# Patient Record
Sex: Female | Born: 1940 | Race: White | Hispanic: No | Smoking: Former smoker
Health system: Southern US, Community
[De-identification: ages and names within clinical notes are randomized; demographics above are authoritative.]

## PROBLEM LIST (undated history)

## (undated) DIAGNOSIS — Z9289 Personal history of other medical treatment: Secondary | ICD-10-CM

## (undated) DIAGNOSIS — J449 Chronic obstructive pulmonary disease, unspecified: Secondary | ICD-10-CM

## (undated) DIAGNOSIS — R51 Headache: Secondary | ICD-10-CM

## (undated) DIAGNOSIS — E785 Hyperlipidemia, unspecified: Secondary | ICD-10-CM

## (undated) DIAGNOSIS — F419 Anxiety disorder, unspecified: Secondary | ICD-10-CM

## (undated) DIAGNOSIS — J189 Pneumonia, unspecified organism: Secondary | ICD-10-CM

## (undated) DIAGNOSIS — J45909 Unspecified asthma, uncomplicated: Secondary | ICD-10-CM

## (undated) DIAGNOSIS — K219 Gastro-esophageal reflux disease without esophagitis: Secondary | ICD-10-CM

## (undated) DIAGNOSIS — M1990 Unspecified osteoarthritis, unspecified site: Secondary | ICD-10-CM

## (undated) DIAGNOSIS — G43909 Migraine, unspecified, not intractable, without status migrainosus: Secondary | ICD-10-CM

## (undated) DIAGNOSIS — I350 Nonrheumatic aortic (valve) stenosis: Secondary | ICD-10-CM

## (undated) DIAGNOSIS — IMO0001 Reserved for inherently not codable concepts without codable children: Secondary | ICD-10-CM

## (undated) DIAGNOSIS — I2510 Atherosclerotic heart disease of native coronary artery without angina pectoris: Secondary | ICD-10-CM

## (undated) DIAGNOSIS — R519 Headache, unspecified: Secondary | ICD-10-CM

## (undated) DIAGNOSIS — Z720 Tobacco use: Secondary | ICD-10-CM

## (undated) DIAGNOSIS — I2699 Other pulmonary embolism without acute cor pulmonale: Secondary | ICD-10-CM

## (undated) DIAGNOSIS — I10 Essential (primary) hypertension: Secondary | ICD-10-CM

## (undated) HISTORY — DX: Personal history of other medical treatment: Z92.89

## (undated) HISTORY — PX: FOOT SURGERY: SHX648

## (undated) HISTORY — DX: Essential (primary) hypertension: I10

## (undated) HISTORY — DX: Atherosclerotic heart disease of native coronary artery without angina pectoris: I25.10

## (undated) HISTORY — DX: Tobacco use: Z72.0

## (undated) HISTORY — DX: Hyperlipidemia, unspecified: E78.5

## (undated) HISTORY — DX: Other pulmonary embolism without acute cor pulmonale: I26.99

## (undated) HISTORY — PX: DILATION AND CURETTAGE OF UTERUS: SHX78

## (undated) HISTORY — PX: TONSILLECTOMY: SUR1361

## (undated) HISTORY — PX: ADENOIDECTOMY: SUR15

---

## 2007-01-17 HISTORY — PX: KNEE SURGERY: SHX244

## 2007-10-13 HISTORY — PX: CARDIAC CATHETERIZATION: SHX172

## 2009-03-18 ENCOUNTER — Encounter: Admission: RE | Admit: 2009-03-18 | Discharge: 2009-03-18 | Payer: Self-pay | Admitting: Family Medicine

## 2009-07-22 ENCOUNTER — Encounter: Admission: RE | Admit: 2009-07-22 | Discharge: 2009-07-22 | Payer: Self-pay | Admitting: Family Medicine

## 2009-09-05 ENCOUNTER — Encounter: Admission: RE | Admit: 2009-09-05 | Discharge: 2009-09-05 | Payer: Self-pay | Admitting: Family Medicine

## 2009-09-27 DIAGNOSIS — Z9289 Personal history of other medical treatment: Secondary | ICD-10-CM

## 2009-09-27 HISTORY — DX: Personal history of other medical treatment: Z92.89

## 2010-03-30 HISTORY — PX: BACK SURGERY: SHX140

## 2010-05-11 ENCOUNTER — Emergency Department (HOSPITAL_BASED_OUTPATIENT_CLINIC_OR_DEPARTMENT_OTHER)
Admission: EM | Admit: 2010-05-11 | Discharge: 2010-05-11 | Disposition: A | Discharge status: Home / Self Care | Payer: Medicare Other | Attending: Emergency Medicine | Admitting: Emergency Medicine

## 2010-05-11 DIAGNOSIS — J45909 Unspecified asthma, uncomplicated: Secondary | ICD-10-CM | POA: Insufficient documentation

## 2010-05-11 DIAGNOSIS — J329 Chronic sinusitis, unspecified: Secondary | ICD-10-CM | POA: Insufficient documentation

## 2010-05-11 DIAGNOSIS — R1110 Vomiting, unspecified: Secondary | ICD-10-CM | POA: Insufficient documentation

## 2010-05-11 DIAGNOSIS — R197 Diarrhea, unspecified: Secondary | ICD-10-CM | POA: Insufficient documentation

## 2010-05-11 DIAGNOSIS — F17200 Nicotine dependence, unspecified, uncomplicated: Secondary | ICD-10-CM | POA: Insufficient documentation

## 2010-05-11 DIAGNOSIS — K5289 Other specified noninfective gastroenteritis and colitis: Secondary | ICD-10-CM | POA: Insufficient documentation

## 2010-05-11 DIAGNOSIS — F341 Dysthymic disorder: Secondary | ICD-10-CM | POA: Insufficient documentation

## 2010-05-11 DIAGNOSIS — I10 Essential (primary) hypertension: Secondary | ICD-10-CM | POA: Insufficient documentation

## 2010-05-11 DIAGNOSIS — E7800 Pure hypercholesterolemia, unspecified: Secondary | ICD-10-CM | POA: Insufficient documentation

## 2010-05-11 LAB — URINALYSIS, ROUTINE W REFLEX MICROSCOPIC
Ketones, ur: 40 mg/dL — AB
Urine Glucose, Fasting: NEGATIVE mg/dL
Urobilinogen, UA: 0.2 mg/dL (ref 0.0–1.0)
pH: 7 (ref 5.0–8.0)

## 2010-05-11 LAB — DIFFERENTIAL
Basophils Relative: 0 % (ref 0–1)
Eosinophils Absolute: 0 10*3/uL (ref 0.0–0.7)
Eosinophils Relative: 0 % (ref 0–5)
Lymphocytes Relative: 4 % — ABNORMAL LOW (ref 12–46)
Monocytes Absolute: 0.3 10*3/uL (ref 0.1–1.0)

## 2010-05-11 LAB — URINE MICROSCOPIC-ADD ON

## 2010-05-11 LAB — COMPREHENSIVE METABOLIC PANEL
AST: 23 U/L (ref 0–37)
Alkaline Phosphatase: 89 U/L (ref 39–117)
BUN: 20 mg/dL (ref 6–23)
CO2: 26 mEq/L (ref 19–32)
Creatinine, Ser: 0.4 mg/dL (ref 0.4–1.2)
GFR calc non Af Amer: >60 mL/min (ref >60)
Total Bilirubin: 0.6 mg/dL (ref 0.3–1.2)
Total Protein: 6.5 g/dL (ref 6.0–8.3)

## 2010-05-11 LAB — CBC
HCT: 36.5 % (ref 36.0–46.0)
Hemoglobin: 12.7 g/dL (ref 12.0–15.0)
MCV: 89.9 fL (ref 78.0–100.0)
RBC: 4.06 MIL/uL (ref 3.87–5.11)
WBC: 8.9 10*3/uL (ref 4.0–10.5)

## 2010-05-13 LAB — URINE CULTURE
Colony Count: NO GROWTH
Culture  Setup Time: 201202122012
Culture: NO GROWTH

## 2010-07-29 DIAGNOSIS — I2699 Other pulmonary embolism without acute cor pulmonale: Secondary | ICD-10-CM

## 2010-07-29 HISTORY — DX: Other pulmonary embolism without acute cor pulmonale: I26.99

## 2010-08-01 ENCOUNTER — Ambulatory Visit (HOSPITAL_COMMUNITY)
Admission: RE | Admit: 2010-08-01 | Discharge: 2010-08-01 | Disposition: A | Discharge status: Home / Self Care | Payer: Medicare Other | Source: Ambulatory Visit | Attending: Anesthesiology | Admitting: Anesthesiology

## 2010-08-01 ENCOUNTER — Encounter (HOSPITAL_COMMUNITY)
Admission: RE | Admit: 2010-08-01 | Discharge: 2010-08-01 | Disposition: A | Discharge status: Home / Self Care | Payer: Medicare Other | Source: Ambulatory Visit | Attending: Orthopedic Surgery | Admitting: Orthopedic Surgery

## 2010-08-01 ENCOUNTER — Other Ambulatory Visit (HOSPITAL_COMMUNITY): Payer: Self-pay | Admitting: Orthopedic Surgery

## 2010-08-01 ENCOUNTER — Other Ambulatory Visit (HOSPITAL_COMMUNITY): Payer: Self-pay | Admitting: Anesthesiology

## 2010-08-01 ENCOUNTER — Ambulatory Visit (HOSPITAL_COMMUNITY)
Admission: RE | Admit: 2010-08-01 | Discharge: 2010-08-01 | Disposition: A | Discharge status: Home / Self Care | Payer: Medicare Other | Source: Ambulatory Visit | Attending: Orthopedic Surgery | Admitting: Orthopedic Surgery

## 2010-08-01 DIAGNOSIS — Z01811 Encounter for preprocedural respiratory examination: Secondary | ICD-10-CM

## 2010-08-01 DIAGNOSIS — Z01810 Encounter for preprocedural cardiovascular examination: Secondary | ICD-10-CM | POA: Insufficient documentation

## 2010-08-01 DIAGNOSIS — R0789 Other chest pain: Secondary | ICD-10-CM | POA: Insufficient documentation

## 2010-08-01 DIAGNOSIS — Z01818 Encounter for other preprocedural examination: Secondary | ICD-10-CM | POA: Insufficient documentation

## 2010-08-01 DIAGNOSIS — I2699 Other pulmonary embolism without acute cor pulmonale: Secondary | ICD-10-CM

## 2010-08-01 DIAGNOSIS — Z01812 Encounter for preprocedural laboratory examination: Secondary | ICD-10-CM | POA: Insufficient documentation

## 2010-08-01 LAB — COMPREHENSIVE METABOLIC PANEL
Alkaline Phosphatase: 79 U/L (ref 39–117)
BUN: 9 mg/dL (ref 6–23)
Creatinine, Ser: <0.47 mg/dL (ref 0.4–1.2)
Potassium: 4.3 mEq/L (ref 3.5–5.1)
Total Bilirubin: 0.2 mg/dL — ABNORMAL LOW (ref 0.3–1.2)
Total Protein: 6.4 g/dL (ref 6.0–8.3)

## 2010-08-01 LAB — CBC
HCT: 37.7 % (ref 36.0–46.0)
MCH: 30.7 pg (ref 26.0–34.0)
MCHC: 33.4 g/dL (ref 30.0–36.0)
Platelets: 255 10*3/uL (ref 150–400)

## 2010-08-01 LAB — DIFFERENTIAL
Basophils Absolute: 0 10*3/uL (ref 0.0–0.1)
Eosinophils Relative: 0 % (ref 0–5)
Lymphs Abs: 1.8 10*3/uL (ref 0.7–4.0)
Monocytes Absolute: 1.2 10*3/uL — ABNORMAL HIGH (ref 0.1–1.0)

## 2010-08-01 LAB — URINE MICROSCOPIC-ADD ON

## 2010-08-01 LAB — APTT: aPTT: 30 seconds (ref 24–37)

## 2010-08-01 LAB — URINALYSIS, ROUTINE W REFLEX MICROSCOPIC
Glucose, UA: NEGATIVE mg/dL
Nitrite: NEGATIVE
Specific Gravity, Urine: 1.019 (ref 1.005–1.030)
Urobilinogen, UA: 1 mg/dL (ref 0.0–1.0)
pH: 6 (ref 5.0–8.0)

## 2010-08-02 ENCOUNTER — Ambulatory Visit (HOSPITAL_COMMUNITY)
Admission: RE | Admit: 2010-08-02 | Discharge: 2010-08-02 | Disposition: A | Discharge status: Home / Self Care | Payer: Medicare Other | Source: Ambulatory Visit | Attending: Orthopedic Surgery | Admitting: Orthopedic Surgery

## 2010-08-02 ENCOUNTER — Other Ambulatory Visit: Payer: Self-pay | Admitting: Orthopedic Surgery

## 2010-08-02 ENCOUNTER — Inpatient Hospital Stay (HOSPITAL_COMMUNITY)
Admission: AD | Admit: 2010-08-02 | Discharge: 2010-08-10 | DRG: 167 | Disposition: A | Discharge status: Home Health | Payer: Medicare Other | Source: Ambulatory Visit | Attending: Internal Medicine | Admitting: Internal Medicine

## 2010-08-02 DIAGNOSIS — Z01812 Encounter for preprocedural laboratory examination: Secondary | ICD-10-CM

## 2010-08-02 DIAGNOSIS — R05 Cough: Secondary | ICD-10-CM

## 2010-08-02 DIAGNOSIS — R059 Cough, unspecified: Secondary | ICD-10-CM

## 2010-08-02 DIAGNOSIS — K219 Gastro-esophageal reflux disease without esophagitis: Secondary | ICD-10-CM | POA: Diagnosis present

## 2010-08-02 DIAGNOSIS — N390 Urinary tract infection, site not specified: Secondary | ICD-10-CM | POA: Diagnosis present

## 2010-08-02 DIAGNOSIS — Z01810 Encounter for preprocedural cardiovascular examination: Secondary | ICD-10-CM

## 2010-08-02 DIAGNOSIS — I2699 Other pulmonary embolism without acute cor pulmonale: Principal | ICD-10-CM | POA: Diagnosis present

## 2010-08-02 DIAGNOSIS — J45909 Unspecified asthma, uncomplicated: Secondary | ICD-10-CM | POA: Diagnosis present

## 2010-08-02 DIAGNOSIS — I2510 Atherosclerotic heart disease of native coronary artery without angina pectoris: Secondary | ICD-10-CM | POA: Diagnosis present

## 2010-08-02 DIAGNOSIS — Z882 Allergy status to sulfonamides status: Secondary | ICD-10-CM

## 2010-08-02 DIAGNOSIS — I10 Essential (primary) hypertension: Secondary | ICD-10-CM | POA: Diagnosis present

## 2010-08-02 DIAGNOSIS — M5380 Other specified dorsopathies, site unspecified: Secondary | ICD-10-CM | POA: Diagnosis present

## 2010-08-02 DIAGNOSIS — Z79899 Other long term (current) drug therapy: Secondary | ICD-10-CM

## 2010-08-02 DIAGNOSIS — Z880 Allergy status to penicillin: Secondary | ICD-10-CM

## 2010-08-02 DIAGNOSIS — F411 Generalized anxiety disorder: Secondary | ICD-10-CM | POA: Diagnosis present

## 2010-08-02 DIAGNOSIS — G4730 Sleep apnea, unspecified: Secondary | ICD-10-CM | POA: Diagnosis present

## 2010-08-02 DIAGNOSIS — Z881 Allergy status to other antibiotic agents status: Secondary | ICD-10-CM

## 2010-08-02 DIAGNOSIS — Z01818 Encounter for other preprocedural examination: Secondary | ICD-10-CM

## 2010-08-02 DIAGNOSIS — F17200 Nicotine dependence, unspecified, uncomplicated: Secondary | ICD-10-CM | POA: Diagnosis present

## 2010-08-02 DIAGNOSIS — Z888 Allergy status to other drugs, medicaments and biological substances status: Secondary | ICD-10-CM

## 2010-08-02 DIAGNOSIS — M5137 Other intervertebral disc degeneration, lumbosacral region: Secondary | ICD-10-CM | POA: Diagnosis present

## 2010-08-02 DIAGNOSIS — M51379 Other intervertebral disc degeneration, lumbosacral region without mention of lumbar back pain or lower extremity pain: Secondary | ICD-10-CM | POA: Diagnosis present

## 2010-08-02 DIAGNOSIS — E785 Hyperlipidemia, unspecified: Secondary | ICD-10-CM | POA: Diagnosis present

## 2010-08-02 LAB — BASIC METABOLIC PANEL
BUN: 10 mg/dL (ref 6–23)
Calcium: 9.6 mg/dL (ref 8.4–10.5)
Creatinine, Ser: <0.47 mg/dL (ref 0.4–1.2)
Glucose, Bld: 174 mg/dL — ABNORMAL HIGH (ref 70–99)

## 2010-08-02 LAB — PROTIME-INR
INR: 0.9 (ref 0.00–1.49)
Prothrombin Time: 12.4 seconds (ref 11.6–15.2)

## 2010-08-02 LAB — CBC
MCH: 30.7 pg (ref 26.0–34.0)
MCHC: 33.9 g/dL (ref 30.0–36.0)
MCV: 90.5 fL (ref 78.0–100.0)
Platelets: 257 10*3/uL (ref 150–400)
RDW: 13 % (ref 11.5–15.5)
WBC: 7 10*3/uL (ref 4.0–10.5)

## 2010-08-02 MED ORDER — IOHEXOL 300 MG/ML  SOLN: 100.0000 mL | Freq: Once | INTRAMUSCULAR | Status: DC | PRN

## 2010-08-03 DIAGNOSIS — I2699 Other pulmonary embolism without acute cor pulmonale: Secondary | ICD-10-CM

## 2010-08-03 LAB — URINALYSIS, MICROSCOPIC ONLY
Glucose, UA: NEGATIVE mg/dL
Nitrite: NEGATIVE
pH: 6.5 (ref 5.0–8.0)

## 2010-08-04 LAB — URINE CULTURE

## 2010-08-05 ENCOUNTER — Inpatient Hospital Stay (HOSPITAL_COMMUNITY): Payer: Medicare Other

## 2010-08-05 DIAGNOSIS — I2699 Other pulmonary embolism without acute cor pulmonale: Secondary | ICD-10-CM

## 2010-08-05 LAB — CBC
HCT: 35.7 % — ABNORMAL LOW (ref 36.0–46.0)
Hemoglobin: 11.8 g/dL — ABNORMAL LOW (ref 12.0–15.0)
MCV: 91.1 fL (ref 78.0–100.0)
RDW: 13 % (ref 11.5–15.5)
WBC: 7 10*3/uL (ref 4.0–10.5)

## 2010-08-05 LAB — BASIC METABOLIC PANEL
BUN: 11 mg/dL (ref 6–23)
Chloride: 101 mEq/L (ref 96–112)
Glucose, Bld: 99 mg/dL (ref 70–99)
Potassium: 4.4 mEq/L (ref 3.5–5.1)
Sodium: 139 mEq/L (ref 135–145)

## 2010-08-05 MED ORDER — IOHEXOL 300 MG/ML  SOLN: 95.0000 mL | Freq: Once | INTRAMUSCULAR | Status: AC | PRN

## 2010-08-06 ENCOUNTER — Inpatient Hospital Stay (HOSPITAL_COMMUNITY): Payer: Medicare Other

## 2010-08-06 ENCOUNTER — Inpatient Hospital Stay (HOSPITAL_COMMUNITY): Admission: RE | Admit: 2010-08-06 | Payer: Medicare Other | Source: Ambulatory Visit | Admitting: Orthopedic Surgery

## 2010-08-07 LAB — BASIC METABOLIC PANEL
CO2: 29 mEq/L (ref 19–32)
Calcium: 8.2 mg/dL — ABNORMAL LOW (ref 8.4–10.5)
Chloride: 103 mEq/L (ref 96–112)
Glucose, Bld: 139 mg/dL — ABNORMAL HIGH (ref 70–99)
Potassium: 3.8 mEq/L (ref 3.5–5.1)
Sodium: 135 mEq/L (ref 135–145)

## 2010-08-07 LAB — POCT I-STAT 7, (LYTES, BLD GAS, ICA,H+H)
Acid-Base Excess: 7 mmol/L — ABNORMAL HIGH (ref 0.0–2.0)
Calcium, Ion: 1.1 mmol/L — ABNORMAL LOW (ref 1.12–1.32)
O2 Saturation: 100 %
Patient temperature: 37
TCO2: 34 mmol/L (ref 0–100)
pCO2 arterial: 50.7 mmHg — ABNORMAL HIGH (ref 35.0–45.0)
pO2, Arterial: 478 mmHg — ABNORMAL HIGH (ref 80.0–100.0)

## 2010-08-07 LAB — CBC
HCT: 29.7 % — ABNORMAL LOW (ref 36.0–46.0)
Hemoglobin: 9.9 g/dL — ABNORMAL LOW (ref 12.0–15.0)
RBC: 3.19 MIL/uL — ABNORMAL LOW (ref 3.87–5.11)
WBC: 14.4 10*3/uL — ABNORMAL HIGH (ref 4.0–10.5)

## 2010-08-07 NOTE — Consult Note (Signed)
NAME:  Judith Stevens, Judith Stevens NO.:  [id number]  MEDICAL RECORD NO.:  [id number]           PATIENT TYPE:  I  LOCATION:  3707                         FACILITY:  [facility]  PHYSICIAN:  Coralyn Helling, MD        DATE OF BIRTH:  [birth year]  DATE OF CONSULTATION: DATE OF DISCHARGE:                                CONSULTATION   REFERRING PHYSICIAN:  Calvert Cantor, MD  REASON FOR CONSULTATION:  Pulmonary emboli.  HISTORY OF PRESENT ILLNESS:  This 70 year old white female who was admitted on Aug 02, 2010, with chief complaint of pleuritic-type chest discomfort.  This was first noted on July 28, 2010.  It progressed significantly to the level of severe pleuritic type left-sided chest wall pain with some mild associated dyspnea on hospital admit day. Ultimately, she underwent a thoroughly diagnostic evaluation, this included a CT of chest which did indeed demonstrate left-sided pulmonary emboli with associated left-sided pulmonary infarct.  She was admitted for systemic anticoagulation and further evaluation.  Pulmonary has been asked to evaluate Ms. Maves as she also has a pending lumbar decompression surgery by [facility], and we have been asked to evaluate and give recommendations in regards to her anticoagulation regimen.  PAST MEDICAL HISTORY: 1. Coronary artery disease. 2. Asthma. 3. Gastroesophageal reflux disease. 4. 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 5. Tonsillectomy. 6. Left knee arthroscopy. 7. Bunionectomy.  ALLERGIES:  Many and include PENICILLIN, SULFA, TETRACYCLINE, PERCOCET, AVELOX and NIACIN.  FAMILY HISTORY:  Father had lung cancer.  This was resected surgically without sequelae.  Both parents had heart disease, mother had diabetes.  SOCIAL HISTORY:  She is a current smoker smoked for over 40 years but has decreased down to 3 cigarettes a day.  Drinks occasional alcohol. Lives alone.  CURRENT MEDICATIONS:  Xanax, Lipitor,  belladonna/pentobarbital, diltiazem, therapeutic strength Lovenox, Lexapro, Zetia, Flonase, Advair, Claritin, multivitamin, nicotine patch, p.r.n. Ventolin, Dulcolax, p.r.n. Fioricet, p.r.n. Norco.  REVIEW OF SYSTEMS:  GENERAL:  Denies weight increase or decrease. Denies appetite change.  Denies fever or chills.  HEENT:  No headache, visual changes, tinnitus, sinusitis, change in smell, no postnasal drip, no dysphagia, no voice hoarseness, no neck pain or stiffness. PULMONARY:  Denies wheeze, shortness of breath, cough, however, does have left-sided pleuritic-type chest pain with deep inspiration.  This has improved since treatment.  CARDIAC:  Denies chest pain, palpitations, orthopnea.  GI:  No nausea, vomiting or stool changes. GU:  Denies dysuria, frequency or hesitancy.  Also denies change in quantity or quality of urine.  NEUROLOGICALLY:  Within normal. ENDOCRINE:  Within baseline.  PHYSICAL EXAMINATION:  VITAL SIGNS:  Temperature 97.5, heart rate 63- 70s, blood pressure 139/70, respirations 16-20, saturation is 95-97% on room air. GENERAL:  This is a 70 year old white female currently in no acute distress. HEENT:  She is normocephalic without jugular venous distention. PULMONARY:  Clear to auscultation.  She still splints the left chest wall with deep inspiration. CARDIAC:  Regular rate and rhythm. ABDOMEN:  Soft, nontender. GU:  Clear yellow. NEUROLOGICALLY:  Intact. EXTREMITIES:  Without edema or discoloration.  She has 2+ pulses.  CURRENT LABORATORY DATA:  CT chest demonstrates  left lingular area pulmonary artery emboli, with associated distal infarct.  The lower extremity Doppler is negative for DVT.  Hemoglobin 11.8, hematocrit 35.7, white blood cell count is 7, platelet count 276.  Sodium 139, potassium 4.4, chloride 101, CO2 33, BUN 11, creatinine 0.47, glucose 99.  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   Recommendations would be ideally to delay surgery for 6 weeks with anticoagulation.  If the surgery cannot be delayed, then an IVC filter could be placed and resume anticoagulation as soon as feasible after surgery.  She would then need to be anticoagulated for at least 6 months to 1 year following this for adequate treatment of pulmonary emboli.  Please keep in mind placement of IVC filter would not protect her from potential progression of current clot burden.     Zenia Resides, NP   ______________________________ Coralyn Helling, MD    PB/MEDQ  D:  08/05/2010  T:  08/06/2010  Job:  [id number]  Electronically Signed by Zenia Resides NP on 08/06/2010 12:56:21 PM Electronically Signed by Coralyn Helling MD on 08/07/2010 09:44:22 AM

## 2010-08-08 ENCOUNTER — Inpatient Hospital Stay (HOSPITAL_COMMUNITY): Payer: Medicare Other

## 2010-08-08 LAB — URINALYSIS, MICROSCOPIC ONLY
Bilirubin Urine: NEGATIVE
Hgb urine dipstick: NEGATIVE
Specific Gravity, Urine: 1.011 (ref 1.005–1.030)
Urobilinogen, UA: 0.2 mg/dL (ref 0.0–1.0)
pH: 7 (ref 5.0–8.0)

## 2010-08-15 LAB — CULTURE, BLOOD (ROUTINE X 2): Culture: NO GROWTH

## 2010-08-17 NOTE — Discharge Summary (Signed)
  NAME:  Judith Stevens, Judith Stevens NO.:  [id number]  MEDICAL RECORD NO.:  [id number]           PATIENT TYPE:  LOCATION:                                [facility]  PHYSICIAN:  Estill Bamberg, MD      DATE OF BIRTH:  May 28, 1940  DATE OF ADMISSION:  08/06/2010 DATE OF DISCHARGE:  08/10/2010                              DISCHARGE SUMMARY   ADMISSION DIAGNOSIS:  L3-S1 spinal stenosis with severe right-sided L5- S1 foraminal stenosis.  DISCHARGE DIAGNOSIS:  L3-S1 spinal stenosis with severe right-sided L5- S1 foraminal stenosis.  PROCEDURE PERFORMED:  L3-S1 decompression with L5-S1 transforaminal lumbar interbody fusion on the right side performed on Aug 06, 2010.  ADMISSION HISTORY:  Briefly, [name] is a very pleasant 70 year old female, who presented to my office with severe debilitating pain in her right leg.  An MRI was consistent with stenosis involving three levels and severe neural foraminal stenosis on the right side at L5-S1 with rather prominent intervertebral collapse at that level.  The patient did fail conservative care.  We did have a discussion regarding going forward with three-level decompression and one-level instrumented fusion.  The patient was admitted on Aug 06, 2010 for the procedure noted above.  HOSPITAL COURSE:  On Aug 06, 2010, the patient was brought to Surgery and underwent the procedure noted above.  The patient tolerated the procedure well and was transferred to recovery in stable condition.  A PCA was given.  She was started immediately postoperatively and was discontinued on the morning of postop day #1.  The patient was mobilized progressively throughout her hospital stay and was neurovascularly intact throughout her hospital stay as well.  The patient was ultimately discharged on postop day #4.  Of particular note, the patient was found to have a small pulmonary infarct.  The patient was therefore admitted to the Medicine team prior  to the surgery noted above and was managed by the Medicine team leading up to her surgery.  An IVC filter was placed and it was felt safe to go forward with the procedure noted above with the IVC filter in place.  Again, the patient was uneventfully discharged on postop day #4.  DISCHARGE INSTRUCTIONS:  The patient will take Percocet for pain and Valium for spasms.  I will evaluate the patient in my office at approximately 2 weeks after her procedure.  The remainder of the patient's discharge instructions and medical care is as per the medicine team.  I have asked that the patient hold off on anticoagulation for a total of 7 days following her procedure, given the risk of an epidural hematoma with anticoagulation.     Estill Bamberg, MD     MD/MEDQ  D:  08/11/2010  T:  08/11/2010  Job:  [id number]  Electronically Signed by Estill Bamberg  on 08/17/2010 02:24:18 PM

## 2010-08-17 NOTE — Op Note (Signed)
Judith Stevens, SECREST NO.:  [id number]  MEDICAL RECORD NO.:  [id number]           PATIENT TYPE:  LOCATION:                                 FACILITY:  PHYSICIAN:  Estill Bamberg, MD      DATE OF BIRTH:  1940/11/22  DATE OF PROCEDURE:  08/06/2010 DATE OF DISCHARGE:                              OPERATIVE REPORT   PREOPERATIVE DIAGNOSES: 1. L3-4, L4-5, L5-S1 spinal stenosis with severe neuroforaminal     stenosis on the right side at L5-S1 resulting in severe right-sided     lumbar radiculopathy. 2. Severe profound L5-S1 degenerative disk disease with significant     height loss resulting in neuroforaminal stenosis as noted above.  POSTOPERATIVE DIAGNOSES: 1. L3-4, L4-5, L5-S1 spinal stenosis with severe neuroforaminal     stenosis on the right side at L5-S1 resulting in severe right-sided     lumbar radiculopathy. 2. Severe profound L5-S1 degenerative disk disease with significant     height loss resulting in neuroforaminal stenosis as noted above.  PROCEDURES: 1. L3-4, L4-5, L5-S1 laminectomy, partial facetectomy and     foraminotomy. 2. Posterolateral spinal fusion L5-S1. 3. Right-sided L5-S1 interbody fusion. 4. Placement of posterior instrumentation L5-S1. 5. Insertion of intervertebral spacer, 27-mm (Concorde bullet cage, 9-     mm height, 8-mm width, 27-mm length). 6. Use of local autograft. 7. Intraoperative use of fluoroscopy.  SURGEON:  Estill Bamberg, MD  ASSISTANT:  None.  ANESTHESIA:  General endotracheal anesthesia.  ESTIMATED BLOOD LOSS:  600 mL with 200 mL of Cell Saver retransfused.  COMPLICATIONS:  None.  DISPOSITION:  Stable.  INDICATIONS FOR PROCEDURE:  Briefly, Judith Stevens is a very pleasant 70- year-old female who did initially presented to me on July 21, 2010, with severe and profound pain in her right leg involving the right groin and the lateral aspect of her thigh and leg.  Of note, the patient has had epidural  injections which she did state helped her significantly, but only on a temporary basis.  She also has failed other conservative care in the form of anti-inflammatories, physical therapy in addition to pain medications.  She did continue to complain of ongoing pain.  I did review an MRI dated March 29, 2010, which was consistent with multilevel degenerative disk disease with spinal stenosis associated with L3-4, L4-5 and L5-S1.  Of particular note, there was noted to be severe height loss at L5-S1 with profound neuroforaminal stenosis secondary to facet hypertrophy and height loss on the right side at L5- S1.  Given the patient's failure of conservative care, we did discuss going forward with a three-level decompression and a instrumented posterolateral fusion with interbody fixation.  The patient fully understood the risks and limitations of the procedure as outlined in my preoperative note.  Of note, upon the patient's workup, she was noted to have a small pulmonary embolus.  I did discuss this.  The patient was ultimately admitted to the medicine team as a result of this particular finding. It was noted that the patient was hemodynamically stable.  The medicine team did ultimately consult Pulmonology.  A IVC filter was placed and I did  have a discussion with the pulmonologist on the day prior to the patient's procedure.  The pulmonologist did discuss that the patient was at increased risk of pulmonary compromise if in additional DVT were to occur, particularly if they were to result in an additional PE.  This was the rationale behind placing a IVC filter for the patient being bridged with Lovenox leading up to her surgery.  Also of note, preoperative duplex ultrasound was obtained and it was negative for any lower extremity DVTs.  The patient did understand that she was at an increased risk of pulmonary compromise given her clinical situation noted above, but she did agree to go  forward.  Also of note, I did explain to the Medicine and the Pulmonary Team that I would ask for the anticoagulation to be held for a total of 7 days in order to ensure that there was no epidural hematoma.  OPERATIVE DETAILS:  On Aug 06, 2010, the patient was brought to surgery and general endotracheal anesthesia was administered.  The patient was placed prone on a well-padded Jackson spinal frame.  All bony prominences were meticulously padded.  Time-out procedure was performed. Vancomycin was given for antibiotic prophylaxis.  SCDs were placed.  The back was then prepped and draped in the usual sterile fashion.  I then made an incision from approximately the spinous process of L3 to approximately the spinous process of S1.  The lamina of L3, L4, L5 and S1 were subperiosteally exposed.  I did place a Kocher over the spinous process of L5 and I did obtain a lateral intraoperative radiograph to help confirm the appropriate operative levels.  I then went forward with exposing the posterolateral gutters first on the right and then on the left.  The transverse process of L5 and the sacral ala of the S1 was subperiosteally exposed and the posterolateral gutters were packed with Ray-Tec soaked with thrombin.  At this point, I went forward with the decompressive aspect of the procedure.  Using a 4-mm high-speed bur and a series of Kerrison punches, I did go forward with a central decompression across the L5-S1, then the L4-5, then the L3-4 interspaces.  I then went forward with a lateral recess decompression bilaterally working from superiorly to inferiorly.  I then performed a thorough and complete neuroforaminal decompression on the right side at L3-4, L4-5 and L5-S1.  Of note, there was rather profound lateral recess and foraminal narrowing at L3-4, to a lesser extent L4-5 and to a rather severe extended L5-S1.  Upon my decompression of L5-S1, I did remove the vast majority of the facet  joint in order to ensure that there was full and complete decompression of the exiting L5 nerve.  At the termination of the decompression, I was easily able pass a Woodson out the neuroforamen on the right side at every intervertebral level.  At this point, I went forward with cannulating the pedicles.  Using live AP and lateral fluoroscopy, I did use a 4-mm bur followed by a curved gearshift probe, followed by a ball-tip probe in order to cannulate the pedicles of L5 and of S1.  The holes were filled with bone wax.  I then went forward with the interbody fixation aspect of the procedure.  With an assistant holding medial retraction of the traversing S1 nerve, I did perform annulotomy using a 15 blade knife and I did use a 7-mm paddle scraper in order to help prepare the endplates above and below.  This was done  under live fluoroscopy in order to confirm the appropriate trajectory of the intervertebral space.  I then used a series of curettes in order to help prepare the intervertebral space adequately. I then went forward with placement of a series of trials and I felt that an 8-mm intervertebral spacer would be the most appropriate fit.  I then took the autograft that was obtained from the decompressive aspect of the procedure and this was packed into the intervertebral space.  The 8- mm bullet cage was also packed into the intervertebral space and was tamped into position under live fluoroscopy.  I was extremely pleased with the final press fit of the intervertebral graft.  The placement was excellent on the AP and lateral fluoroscopic views.  At this point, I placed L5 and S1 screws.  A 7 x 45 mm screws were placed at L5 and 7 x 40 mm screws were placed at S1.  I did use motor evoked EMG potentials and the L5 screw tested at 22, where the S1 screw tested at 42.  I then placed a 35-mm rod into position and caps were placed and a final locking procedure was performed.  I then turned my  attention towards the patient's left side.  I did use a 4-mm high-speed bur to thoroughly decorticate the posterior elements of L5 and S1 on the left in addition to the transverse process and the sacral ala on the left.  An abundant amount of autograft was packed into the posterior elements and the posterolateral gutter.  I then placed L5 and S1 screws as described previously.  The L5 screw tested at 26 mA and the S1 screw tested at 40 mA.  A 35-mm rod was placed and a final locking procedure was performed. I was very happy with the final construct and I did get a final lateral intraoperative radiograph in order to confirm appropriate placement of the hardware.  Again, I was very pleased with the final construct. Prior to placing the autograft and the left posterolateral gutter, I did copiously irrigate the wound with approximately 2 L of normal saline. Also of note, I did relax retractors approximately every 30 minutes in order to help optimize perfusion to the soft tissues.  At this point of the procedure, I did turn my attention towards epidural bleeding and I did spend approximately 30 minutes using FloSeal in addition to bipolar electrocautery to control all epidural bleeders I was able to encounter. I ultimately was able to adequately control epidural bleeding completely.  A #15 Blake drain was placed.  The fascia was then closed using #1 Vicryl.  The subcutaneous layer was closed using 2-0 Vicryl and the skin was closed using 3-0 Monocryl.  All instrument counts were correct at the termination of the procedure.  Of note, the patient had full neurovascular function upon awakening.     Estill Bamberg, MD     MD/MEDQ  D:  08/06/2010  T:  08/07/2010  Job:  [id number]  Electronically Signed by Estill Bamberg  on 08/17/2010 02:24:23 PM

## 2010-08-28 NOTE — H&P (Signed)
NAME:  Judith Stevens, Judith Stevens NO.:  [id number]  MEDICAL RECORD NO.:  [id number]           PATIENT TYPE:  LOCATION:                                 FACILITY:  PHYSICIAN:  Calvert Cantor, M.D.     DATE OF BIRTH:  [birth year]  DATE OF ADMISSION: DATE OF DISCHARGE:                             HISTORY & PHYSICAL   PRIMARY CARE PHYSICIAN:  Dr. Warrick Parisian at [facility] in [place].  PRESENTING COMPLAINT:  She is being admitted for pulmonary emboli.  HISTORY OF PRESENT ILLNESS:  This is a 70 year old female with a past medical history of hypertension, coronary artery disease unstentable, asthma, and bone spurs in the lumbar area causing nerve root compression.  The patient states that she has been having left lower-sided chest pain for the past few days now.  An x-ray of her chest was initially performed yesterday, which reveals a left lower lobe opacity, which was suspicious of infarct.  Therefore, a CT of her chest was ordered today, which revealed pulmonary emboli involving the lingular pulmonary artery branches along with a lingular infarct.  The patient is therefore being admitted for management.  Complicating her issues as the fact that the patient was scheduled for a lumbar decompression and fusion 3 days from today.  The patient has no symptoms of shortness of breath at rest or with exertion.  She states pain is better than it was one and initially started.  PAST MEDICAL HISTORY: 1. Coronary artery disease.  The patient states that the vessel was     not stentable.  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  She has been stable since then. 2. Asthma. 3. Gastroesophageal reflux disease. 4. Bone spurs in lumbar area.  PAST SURGICAL HISTORY: 1. Tonsillectomy. 2  Left knee arthroscopy. 1. Bunionectomy. 2. Cardiac cath.  ALLERGIES:  The patient has numerous allergies.  These are as follows 1.  PENICILLIN. 2. SULFA. 3. TETRACYCLINE. 4. PERCOCET, which causes shortness of breath. 5. AVELOX causes shortness of breath. 6. NAPROSYN causes shortness of breath. 7. CEFPROZIL also causes shortness of breath. 8. Food allergies include PEANUTS, RED DYE 40, and all DAIRY PRODUCTS.  MEDICATIONS:  Per med rec, she is on the following; 1. Multivitamin/iron OTC 1 tab daily. 2. Diltiazem CD 240 mg at bedtime. 3. Terbutaline 5 mg tablet as needed for shortness of breath. 4. Advair 150/21 two puffs twice a day. 5. Fluticasone Veramyst 27.5 mcg 1 spray each nostril daily. 6. Xyzal 5 mg twice a day. 7. Zetia 10 mg at bedtime. 8. Nitroglycerin 0.4 mg every 5 minutes as needed up to 3 doses. 9. Lipitor 1 tab daily at bedtime. 10.Belladonna alkaloids 1 tab q.i.d. 11.Protonix 40 mg b.i.d. with meals. 12.Alprazolam 0.25 mg every 8 hours as needed. 13.Lexapro 20 mg daily. 14.Fiorinal with Codeine 1 tab daily as needed. 15.Albuterol inhaler 2 puffs q.4 h. as needed.  FAMILY HISTORY:  Lung cancer in her father.  Both parents were had heart disease.  Mother had diabetes.  She has a sister with diabetes and carotid artery stenosis.  SOCIAL HISTORY:  Has  smoked for about 40 years, recently a pack was lasting her in about 3 days.  Drinks alcohol socially.  She lives alone.  REVIEW OF SYSTEMS:  Has lost about 6 pounds in the past few weeks, as she does not want it to cook with her leg pain.  HEENT:  Complains of sinus headaches.  No change in vision.  No blurred vision or double vision.  No sore throat or earaches.  She does complain of migraines. RESPIRATORY:  No cough.  No shortness of breath unless she is exposed to some of her allergies.  CARDIAC:  No chest pain, palpitations, or pedal edema.  GI:  No nausea, vomiting, diarrhea, or constipation.  GU:  No dysuria, hematuria, or incontinence.  Hematologically, bruises easily. SKIN:  No rash.  MUSCULOSKELETAL:  Has back pain, which radiates  down her right leg.  Neurologically, no history of stroke or seizure. Psychologically has generalized anxiety disorder, controlled with Lexapro.  PHYSICAL EXAMINATION:  VITAL SIGNS:  Elderly female sitting up in bed, in no acute distress. VITAL SIGNS:  Temperature 98.8, pulse 81, respiratory rate 19, blood pressure 140/65, and pulse ox 97% on room air. HEENT:  Pupils equal, round, and reactive to light.  Extraocular movements are intact.  Conjunctivae is pink.  No scleral icterus.  Oral mucosa moist. NECK:  Supple.  No thyromegaly, lymphadenopathy, or carotid bruits. HEART:  Regular rate and rhythm.  No murmurs, rubs, or gallops. LUNGS:  Clear bilaterally.  Good respiratory effort.  No use of accessory muscles. ABDOMEN:  Soft, nontender, and nondistended.  Bowel sounds positive.  No organomegaly. EXTREMITIES:  No cyanosis, clubbing, or edema.  Pedal pulses positive. NEUROLOGIC:  Cranial nerves II through XII intact.  Strength intact in all 4 extremities. PSYCHOLOGIC:  Awake, alert, and oriented x3.  Mood and affect normal. SKIN:  Warm and dry.  Few bruises.  No rash.  LABORATORY DATA:  Blood work, CBC is within normal limits except for hemoglobin of 11.9 and a hematocrit of 35.1.  Metabolic panel is also within normal limits except for glucose of 174.  PT is normal at 12.4 and INR 0.9.  Of note, UA yesterday revealed large leukocytes and 21-50 wbc's and few bacteria.  The patient does not complain of any dysuria.  IMAGING RESULTS:  CT angiogram of the chest performed today revealed pulmonary emboli involving the lingula and pulmonary artery branches. There is abnormal parenchymal lung tissue at the base of the lingula concerning for infarcted tissue.  There is a small amount of left pleural fluid.  This is not scattered small low density structures in the liver, which are nonspecific.  These most likely represents cysts, unless there is a history of malignancy.  Chest x-ray  2-view performed on Aug 01, 2010, reveals new left lower lobe opacity.  ASSESSMENT AND PLAN: 1. Pulmonary emboli with lingular infarct.  The patient is stable as     far as her oxygen saturations and the fact that she is not short of     breath at rest or with exertion.  The patient is anxious to go home     rather than stay in the hospital over the next 3 days awaiting her     surgery.  At this point, I will go ahead and start Lovenox     injections.  She will be taught how to do these herself, which she     is in agreement with.  I would also obtain lower extremity Dopplers  to see if she has any further clot present, since she will be     undergoing a surgery and may not be able to be anticoagulated for     few days.  We may need to have her evaluated for an IVC filter     placement. 2. Hypertension. 3. Hyperlipidemia. 4. Gastroesophageal reflux disease. 5. Asthma.  TIME:  H and P time on admission was 50 minutes.     Calvert Cantor, M.D.     SR/MEDQ  D:  08/02/2010  T:  08/02/2010  Job:  [id number]  cc:   Dr. Warrick Parisian  Electronically Signed by Calvert Cantor M.D. on 08/28/2010 07:38:32 AM

## 2010-08-28 NOTE — Discharge Summary (Signed)
[name], [name]         ACCOUNT NO.:  [id number]  MEDICAL RECORD NO.:  [id number]           PATIENT TYPE:  I  LOCATION:  3713                         FACILITY:  [facility]  PHYSICIAN:  Sundra Aland, MD      DATE OF BIRTH:  12/07/1940  DATE OF ADMISSION:  08/02/2010 DATE OF DISCHARGE:  08/09/2010                              DISCHARGE SUMMARY   DISCHARGE DIAGNOSES: 1. Pulmonary embolism with pulmonary infarct. 2. Status post inferior vena cava filter. 3. Status post lumbar decompressive surgery. 4. Hypertension. 5. Hyperlipidemia.  CONSULTANTS:  Orthopedic surgeon as well as Pulmonary.  PROCEDURE PERFORMED:  Dr. Estill Bamberg, L4-L5/L5-S1 laminectomy, fasciectomy, and foraminotomy, etc.  DISCHARGE MEDICATIONS: 1. Diazepam 5 mg q.6 h. as needed. 2. Percocet 5/325 one to three every 4 hours as needed. 3. Advair Diskus 2 puffs twice daily. 4. Albuterol inhaler 2 puffs every 4 hours as needed. 5. Alprazolam for anxiety 0.25 mg every 6 hours as needed. 6. Belladonna alkaloids 4 times daily. 7. Diltiazem 240 mg daily. 8. Fluticasone 1 spray to each nostril daily. 9. Lexapro 1 tablet daily by mouth.  These are home medications: 1. Lipitor daily. 2. Sublingual nitroglycerin 0.4 mg every 5 minutes as needed, maximal     3 doses. 3. Protonix 40 mg daily. 4. Terbutaline 5 mg as needed. 5. Zetia 10 mg daily.  Discontinued medication: Fiorinal with codeine.  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  Apparently, surgery has been planned; however, the patient developed pleuritic chest pain on the left side on Aug 02, 2010,  and came to the hospital. Chest x-ray shows opacification suggesting infarct.  This was followed over the CT angio that proved the patient has PE.  The dilemma  was that the patient needed surgery and it was already planned and she required anticoagulation and was transferred to [facility]. The patient was seen in consult by Dr. Zenia Resides and his recommendation is suggesting that surgery could be delayed for 6 weeks with anticoagulation.  If surgery cannot be delayed, then he suggested an IVC filter could be placed and anticoagulation can be resumed as suitable after surgery.  The patient will need at least 6 months anticoagulation to 1 year for adequate treatment of her PE.  So the patient has had bridging with IVC filter.  Orthopedics has seen the patient and cleared her with the possibility that she can go home by Saturday.  So, therefore she has been receiving physical therapy. Today, she is able to walk 70 feet with rolling walker and did very well.  Although, she did completed with excruciating pain in her back, I explained to her that it can be treated with p.o. medication, which is actually what she was getting while in the hospital.  Her vital signs today are stable.  Her blood pressure is 124/62, heart rate 87, respirations 18, temperature 98.9, saturation 97% on room air.  She therefore will be discharged in stable clinical condition.  DISCHARGE DIET:  2 g sodium.  DISCHARGE ACTIVITY:  Continue with physical therapy as an outpatient.  DISCHARGE FOLLOWUP:  The patient is to follow up with PCCM on Aug 13, 2010.  The goal is her Coumadin can then be resumed at this time per Orthopedics recommendation and at that time removal of the IVC filter bridge would also be discussed.  Please note that, at this point, the patient is resisting discharge with complained of pain.  However, I did explain to her that the reason for her admission has been fully and adequately addressed, she receive physical therapy.  She has been scheduled to follow up with subspecialities and therefore it is reasonable for her to be discharged home.  She will therefore  will be discharged in a stable clinical condition.     Sundra Aland, MD     LA/MEDQ  D:  08/09/2010  T:  08/09/2010  Job:  [id number]  Electronically Signed by Sundra Aland MD on 08/28/2010 05:11:27 PM

## 2010-12-23 IMAGING — CT CT ABD-PELV W/ CM
3 of 5 series · 13 of 36 positions shown, 19 images · IV contrast (READICAT/WATER & [ID] OMNI [id number])
Comparison: None.

CLINICAL DATA: Right upper quadrant pain, some pelvic pain

CT ABDOMEN AND PELVIS WITH CONTRAST
TECHNIQUE: Multidetector CT imaging of the abdomen and pelvis was
performed following the standard protocol during bolus
administration of intravenous contrast.
Contrast: 100 ml [id number]

[Series 3: routine abdomen · axial · 0.70mm/px · z∈[-365,-60]mm · 7 of 83 slices shown, 12 images]
[im 11/83  soft-tissue]
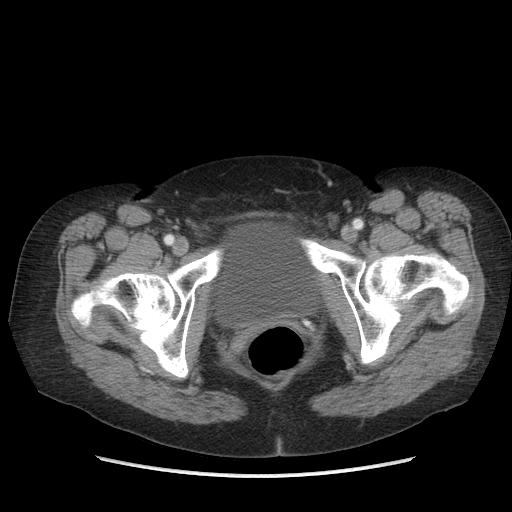
[im 11/83  bone]
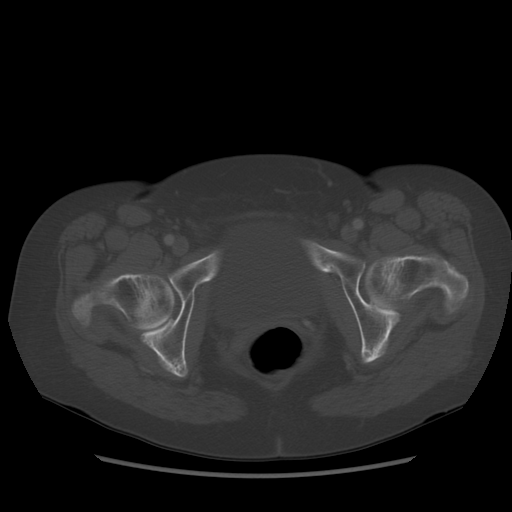
[im 21/83  soft-tissue]
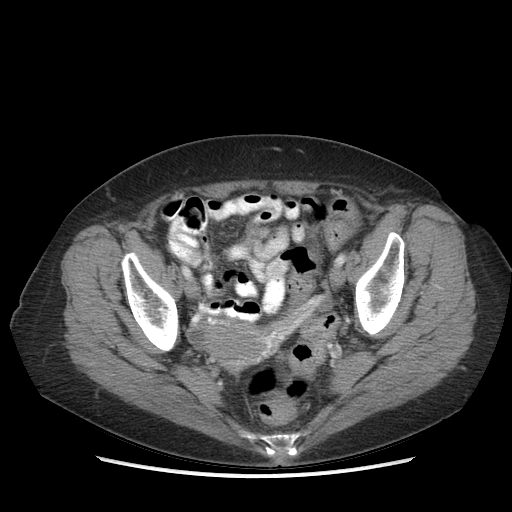
[im 31/83  soft-tissue]
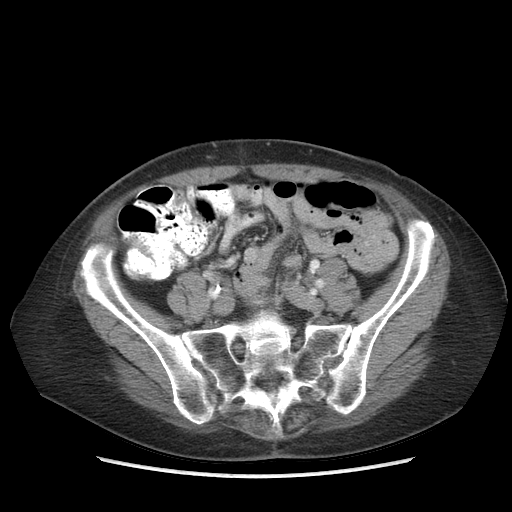
[im 42/83  soft-tissue]
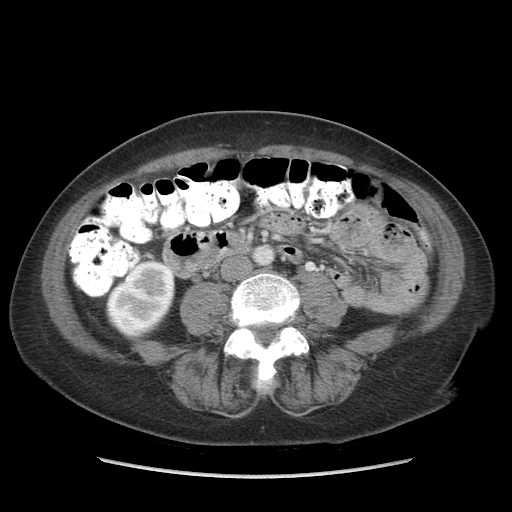
[im 42/83  lung]
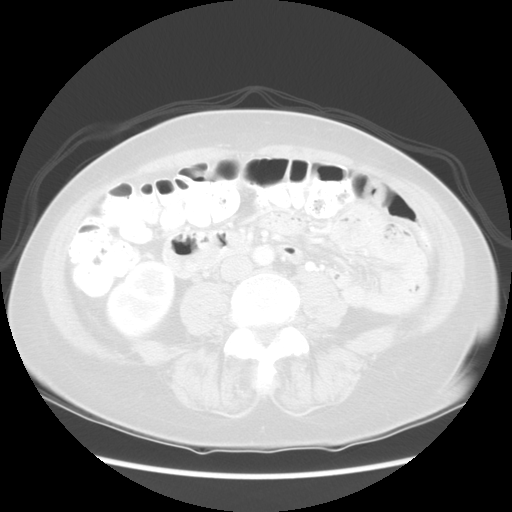
[im 52/83  soft-tissue]
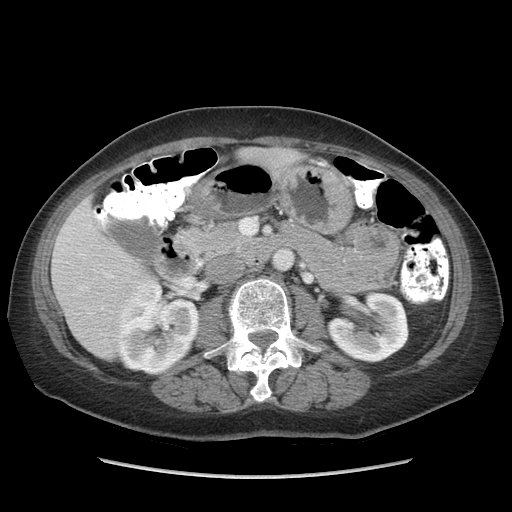
[im 52/83  lung]
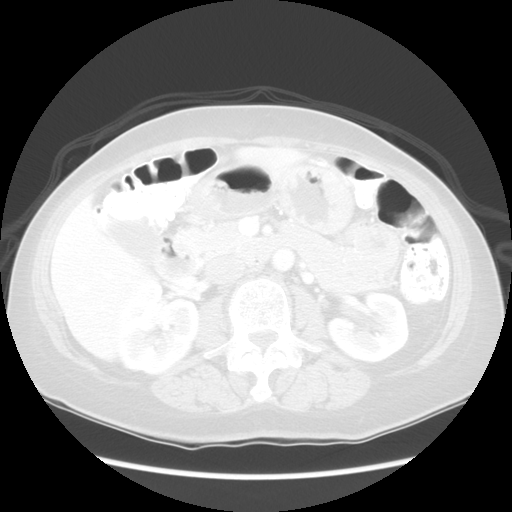
[im 62/83  soft-tissue]
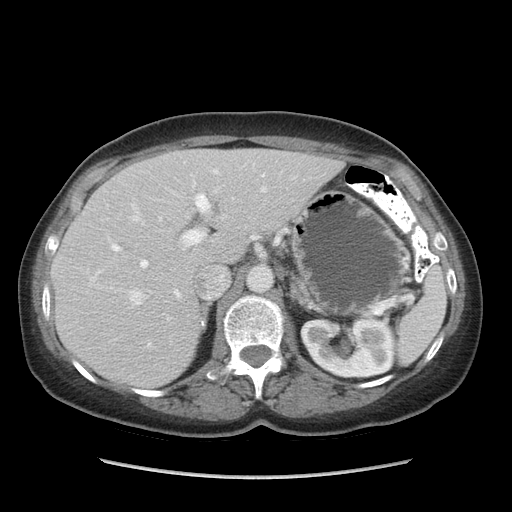
[im 62/83  lung]
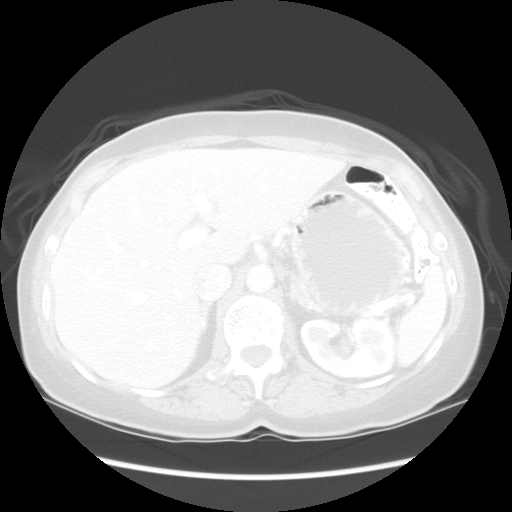
[im 72/83  soft-tissue]
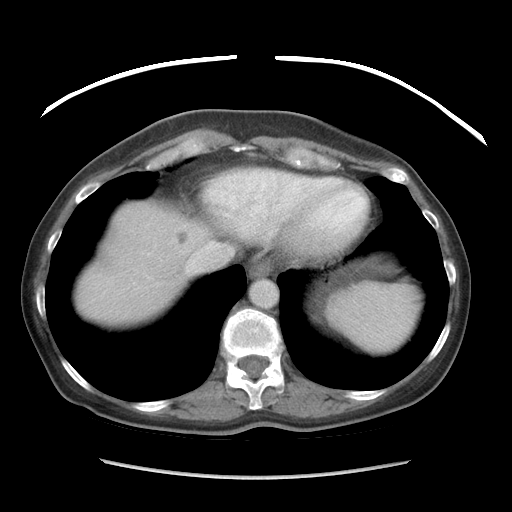
[im 72/83  lung]
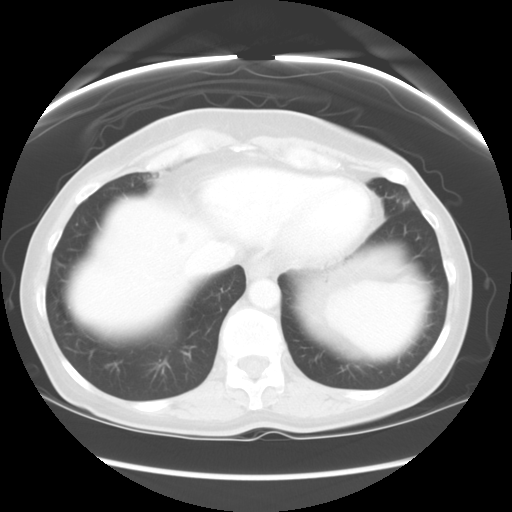

[Series 601: coronal body · coronal · 0.86mm/px · 1 of 115 slices shown, 2 images]
[im 39/115  soft-tissue]
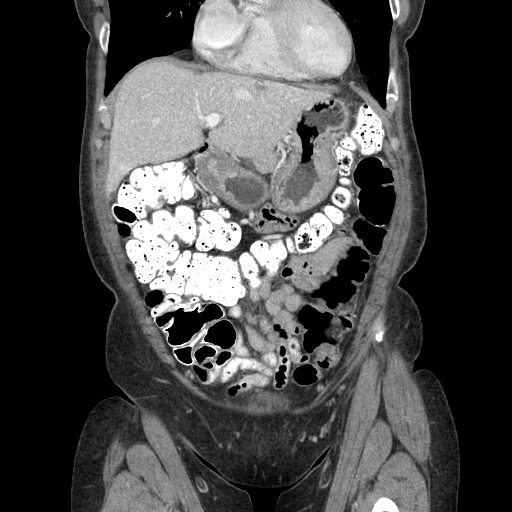
[im 39/115  bone]
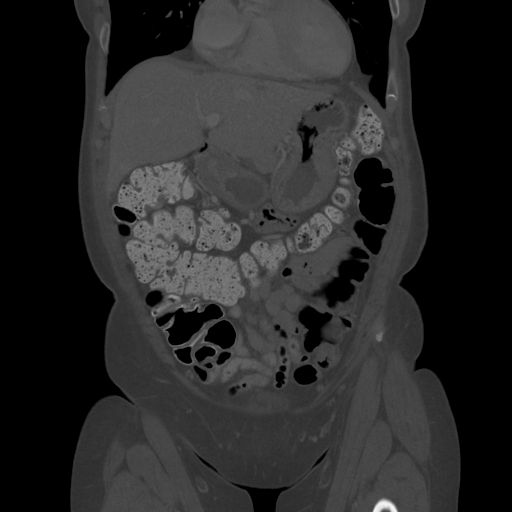

[Series 602: sagittal body · sagittal · 0.86mm/px · 5 of 143 slices shown]
[im 10/143  soft-tissue]
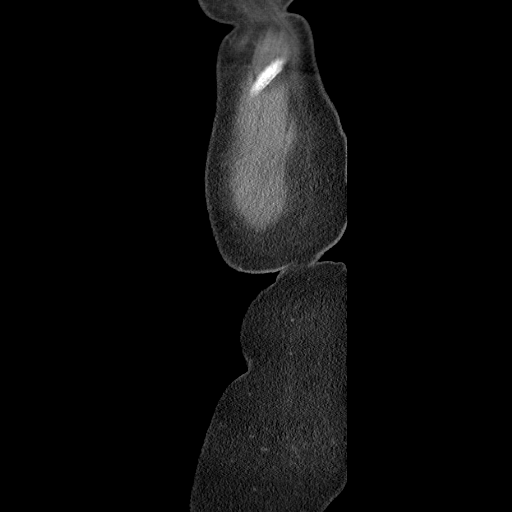
[im 29/143  soft-tissue]
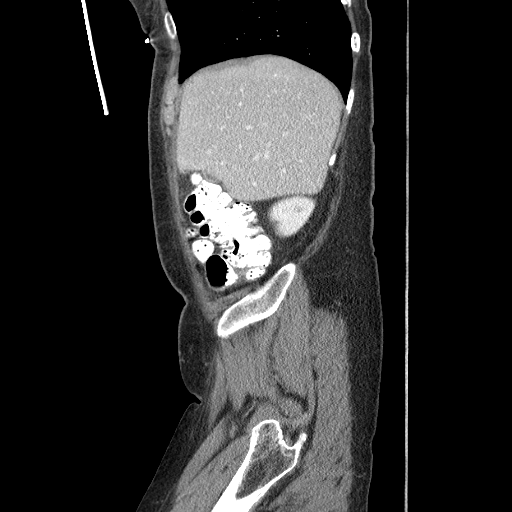
[im 48/143  soft-tissue]
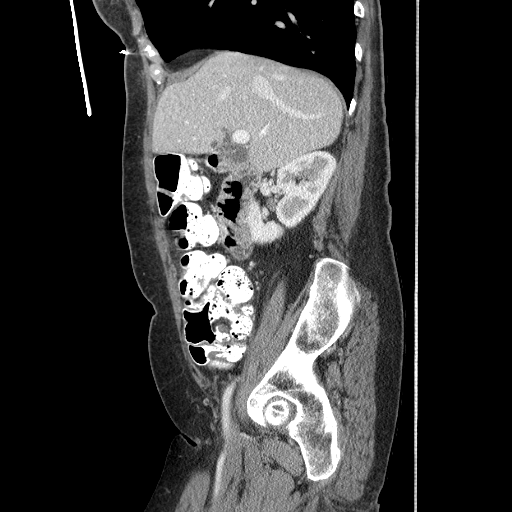
[im 67/143  soft-tissue]
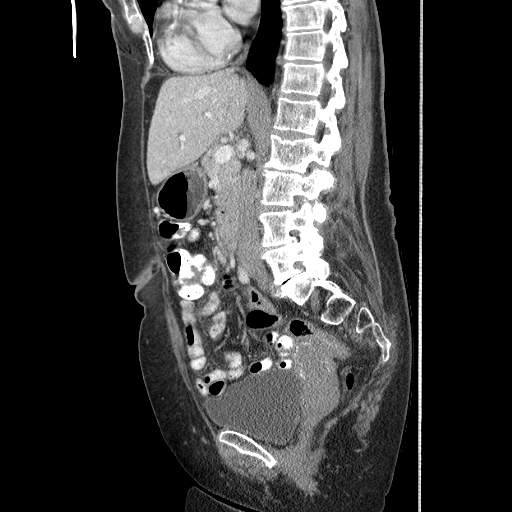
[im 76/143  soft-tissue]
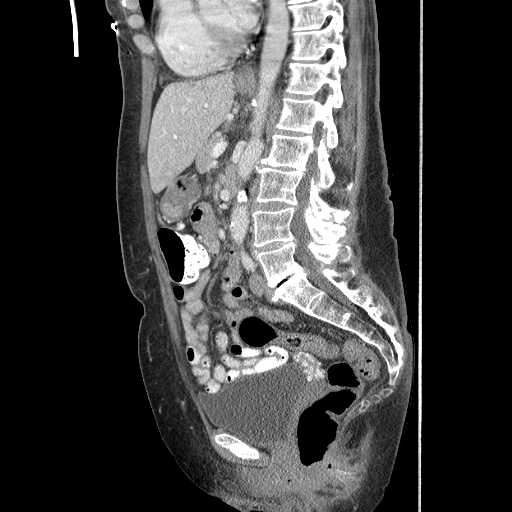

[13 of 36 positions shown; findings below may reference images not displayed]

FINDINGS: The lung bases are clear.  The heart is mildly enlarged.
There are scattered low attenuation foci within the liver most
consistent with cysts or possible small hemangiomas.  Many of these
small low attenuation structure persist on delayed images
consistent with small cysts .  No calcified gallstones are seen.
The pancreas is normal in size and the pancreatic duct is not
dilated.  The adrenal glands and spleen are unremarkable other than
some calcification of the right adrenal gland.  The stomach is
moderately fluid distended and unremarkable.  The kidneys enhance
with no calculus or mass and no hydronephrosis is seen on delayed
images.  The abdominal aorta is normal in caliber with mild
atheromatous change noted.

The uterus is normal in size, to the right of midline.  The urinary
bladder is unremarkable.  No adnexal lesion is seen.  No fluid is
noted within the pelvis.  The appendix and the terminal ileum are
unremarkable.  Degenerative disc disease is present at the L5 S1
level.
IMPRESSION: 1.  No significant abnormality on CT of the abdomen and pelvis.
2.  Mild cardiomegaly.
3.  Small hepatic cysts.
4.  The appendix and terminal ileum appear normal.

## 2011-10-29 DIAGNOSIS — Z9289 Personal history of other medical treatment: Secondary | ICD-10-CM

## 2011-10-29 HISTORY — DX: Personal history of other medical treatment: Z92.89

## 2012-09-27 ENCOUNTER — Encounter: Payer: Self-pay | Admitting: Physician Assistant

## 2012-09-27 ENCOUNTER — Ambulatory Visit (INDEPENDENT_AMBULATORY_CARE_PROVIDER_SITE_OTHER): Payer: Medicare Other | Admitting: Physician Assistant

## 2012-09-27 VITALS — BP 148/80 | HR 72 | Ht 64.0 in | Wt 135.8 lb

## 2012-09-27 DIAGNOSIS — F17200 Nicotine dependence, unspecified, uncomplicated: Secondary | ICD-10-CM

## 2012-09-27 DIAGNOSIS — E785 Hyperlipidemia, unspecified: Secondary | ICD-10-CM

## 2012-09-27 DIAGNOSIS — R0789 Other chest pain: Secondary | ICD-10-CM

## 2012-09-27 DIAGNOSIS — R0602 Shortness of breath: Secondary | ICD-10-CM

## 2012-09-27 DIAGNOSIS — Z720 Tobacco use: Secondary | ICD-10-CM

## 2012-09-27 DIAGNOSIS — I2510 Atherosclerotic heart disease of native coronary artery without angina pectoris: Secondary | ICD-10-CM

## 2012-09-27 DIAGNOSIS — I10 Essential (primary) hypertension: Secondary | ICD-10-CM

## 2012-09-27 DIAGNOSIS — R079 Chest pain, unspecified: Secondary | ICD-10-CM

## 2012-09-27 NOTE — Progress Notes (Signed)
Patient ID: Judith Stevens, female   DOB: 10-22-1940, 72 y.o.   MRN: [id number]    Date:  09/27/2012   ID:  Judith Stevens, DOB Aug 14, 1940, MRN [id number]  PCP:  Warrick Parisian, MD  Primary Cardiologist:  Tresa Endo    History of Present Illness: Judith Stevens is a 72 y.o. female a history of coronary artery disease MI hypertension, hyperlipidemia, to continued tobacco abuse, pulmonary embolism. Patient's last cardiac catheterization was in 2009 did not show a significant change from prior. The cardiac catheterization initially in 2001 which showed 60% ostial narrowing the LAD. Most recent nuclear stress test was August 2013 cemented in one year ago and was negative for ischemia with an EF of 59%.  Patient presents today after having 3 episodes of chest pain yesterday each lasting approximately 30 seconds the first happened while she was in her car driving. The intensity was 4/10. She reports no associated symptoms. However, she is short of breath frequently which may be secondary to COPD and continued tobacco abuse. Reports off and on nausea but again not associated with chest pain. She also denies  vomiting, fever, orthopnea, dizziness, PND, cough, congestion, abdominal pain, hematochezia, melena, lower extremity edema, claudication.  Wt Readings from Last 3 Encounters:  09/27/12 135 lb 12.8 oz (61.598 kg)     Past Medical History  Diagnosis Date  . Hypertension   . Hyperlipidemia   . CAD (coronary artery disease)     single vessel  . Tobacco abuse   . Pulmonary emboli 5/12    Current Outpatient Prescriptions  Medication Sig Dispense Refill  . albuterol (PROVENTIL HFA;VENTOLIN HFA) 108 (90 BASE) MCG/ACT inhaler Inhale 2 puffs into the lungs every 6 (six) hours as needed for wheezing.      Marland Kitchen ALPRAZolam (XANAX) 0.25 MG tablet Take 0.25 mg by mouth at bedtime as needed for sleep.      Marland Kitchen atorvastatin (LIPITOR) 80 MG tablet Take 80 mg by mouth daily.      .  butalbital-aspirin-caffeine-codeine (FIORINAL WITH CODEINE) 50-325-40-30 MG capsule Take 1 capsule by mouth every 4 (four) hours as needed for pain.      . calcium-vitamin D (OSCAL WITH D) 500-200 MG-UNIT per tablet Take 2 tablets by mouth.      . cetirizine (ZYRTEC) 10 MG tablet Take 10 mg by mouth daily.      Marland Kitchen diltiazem (CARDIZEM CD) 240 MG 24 hr capsule Take 240 mg by mouth daily.      Marland Kitchen escitalopram (LEXAPRO) 20 MG tablet Take 20 mg by mouth daily.      Marland Kitchen ezetimibe (ZETIA) 10 MG tablet Take 10 mg by mouth daily.      . fluticasone (VERAMYST) 27.5 MCG/SPRAY nasal spray Place 2 sprays into the nose daily as needed for rhinitis.      . fluticasone-salmeterol (ADVAIR HFA) 115-21 MCG/ACT inhaler Inhale 2 puffs into the lungs 2 (two) times daily.      . nitroGLYCERIN (NITROSTAT) 0.4 MG SL tablet Place 0.4 mg under the tongue every 5 (five) minutes as needed for chest pain.      Marland Kitchen ondansetron (ZOFRAN) 4 MG tablet Take 4 mg by mouth every 8 (eight) hours as needed for nausea.      . pantoprazole (PROTONIX) 40 MG tablet Take 40 mg by mouth 2 (two) times daily.      . terbutaline (BRETHINE) 5 MG tablet Take 5 mg by mouth every 6 (six) hours.       No current  facility-administered medications for this visit.    Allergies:    Allergies  Allergen Reactions  . Dairy Aid (Lactase)   . Levaquin (Levofloxacin)   . Peanuts (Peanut Oil)   . Penicillins   . Percocet (Oxycodone-Acetaminophen)   . Red Dye   . Tetracyclines & Related     Social History:  The patient  reports that she has been smoking Cigarettes.  She has been smoking about 0.33 packs per day. She does not have any smokeless tobacco history on file. She reports that  drinks alcohol. She reports that she does not use illicit drugs.   Family history:   Family History  Problem Relation Age of Onset  . Heart attack Mother   . Diabetes Mother   . Heart attack Father   . Diabetes Sister   . Hyperlipidemia Sister     ROS:  Please see  the history of present illness.  All other systems reviewed and negative.   PHYSICAL EXAM: VS:  BP 148/80  Pulse 72  Ht 5\' 4"  (1.626 m)  Wt 135 lb 12.8 oz (61.598 kg)  BMI 23.3 kg/m2 Well nourished, well developed, in no acute distress HEENT: Pupils are equal round react to light accommodation extraocular movements are intact.  Neck: no JVDNo cervical lymphadenopathy. Cardiac: Regular rate and rhythm without murmurs rubs or gallops. Lungs:  clear to auscultation bilaterally, no wheezing, rhonchi or rales Abd: soft, nontender, positive bowel sounds all quadrants, no hepatosplenomegaly Ext: no lower extremity edema.  2+ radial and dorsalis pedis pulses. Skin: warm and dry Neuro:  Grossly normal  EKG:  Normal sinus rhythm with a rate of 72 beats per minute  ASSESSMENT AND PLAN:  Problem List Items Addressed This Visit   Tobacco abuse     3-10 minutes of tobacco cessation counseling was provided. Patient reports decreased usage. She has tried Chantix the past which did not agree with her. She's also tried the patches which the cause a reaction with her skin.    Hypertension     Blood pressure is mildly elevated this morning. The patient will monitor her blood pressure daily basis at different times during the day if her blood pressures consistently over 130/80 she will call her office and we can start her on a low-dose ACE inhibitor. Given her continued tobacco abuse I would avoid beta blockers initially.    Relevant Medications      atorvastatin (LIPITOR) 80 MG tablet      diltiazem (CARDIZEM CD) 240 MG 24 hr capsule      ezetimibe (ZETIA) 10 MG tablet      nitroGLYCERIN (NITROSTAT) 0.4 MG SL tablet   Hyperlipidemia   Relevant Medications      atorvastatin (LIPITOR) 80 MG tablet      diltiazem (CARDIZEM CD) 240 MG 24 hr capsule      ezetimibe (ZETIA) 10 MG tablet      nitroGLYCERIN (NITROSTAT) 0.4 MG SL tablet   Coronary artery disease   Relevant Medications      atorvastatin  (LIPITOR) 80 MG tablet      diltiazem (CARDIZEM CD) 240 MG 24 hr capsule      ezetimibe (ZETIA) 10 MG tablet      nitroGLYCERIN (NITROSTAT) 0.4 MG SL tablet   Chest pain at rest     Patient's chest pain does not appear to be angina. Skin no acute changes on her EKG. They're self-limited with no associated symptoms. She does have a known coronary disease with a  60% ostial narrowing of her LAD based on cath in 2001 and no change by cath in 2009. She did have a nuclear stress test less than one year ago which is reassuring reassuring and was negative for ischemia. Did recommend that the patient start implementing some exercise in her daily routine. She does have a treadmill which she continues. If she develops any anginal symptoms, she will call our office immediately.  She does have sublingual nitroglycerin should she develop more significant symptoms.     Other Visit Diagnoses   Chest discomfort    -  Primary    Relevant Orders       EKG 12-Lead    Shortness of breath        Relevant Orders       EKG 12-Lead

## 2012-09-27 NOTE — Assessment & Plan Note (Signed)
Blood pressure is mildly elevated this morning. The patient will monitor her blood pressure daily basis at different times during the day if her blood pressures consistently over 130/80 she will call her office and we can start her on a low-dose ACE inhibitor. Given her continued tobacco abuse I would avoid beta blockers initially.

## 2012-09-27 NOTE — Assessment & Plan Note (Signed)
Patient's chest pain does not appear to be angina. Skin no acute changes on her EKG. They're self-limited with no associated symptoms. She does have a known coronary disease with a 60% ostial narrowing of her LAD based on cath in 2001 and no change by cath in 2009. She did have a nuclear stress test less than one year ago which is reassuring reassuring and was negative for ischemia. Did recommend that the patient start implementing some exercise in her daily routine. She does have a treadmill which she continues. If she develops any anginal symptoms, she will call our office immediately.  She does have sublingual nitroglycerin should she develop more significant symptoms.

## 2012-09-27 NOTE — Assessment & Plan Note (Signed)
3-10 minutes of tobacco cessation counseling was provided. Patient reports decreased usage. She has tried Chantix the past which did not agree with her. She's also tried the patches which the cause a reaction with her skin.

## 2012-09-27 NOTE — Patient Instructions (Signed)
Use your  treadmill and exercise on daily basis initially at 15 minutes. If you develop anginal chest pain symptoms call or office immediately.  Monitor blood pressure on a daily basis at different times during the day. Her blood pressure is consistently over 130/80 we will need to start additional blood pressure medication.

## 2012-10-10 ENCOUNTER — Other Ambulatory Visit: Payer: Medicare Other

## 2012-10-10 ENCOUNTER — Other Ambulatory Visit: Payer: Self-pay | Admitting: Family Medicine

## 2012-10-10 DIAGNOSIS — J310 Chronic rhinitis: Secondary | ICD-10-CM

## 2012-10-10 DIAGNOSIS — R519 Headache, unspecified: Secondary | ICD-10-CM

## 2012-10-12 ENCOUNTER — Ambulatory Visit
Admission: RE | Admit: 2012-10-12 | Discharge: 2012-10-12 | Disposition: A | Discharge status: Home / Self Care | Payer: Medicare Other | Source: Ambulatory Visit | Attending: Family Medicine | Admitting: Family Medicine

## 2012-10-12 DIAGNOSIS — J310 Chronic rhinitis: Secondary | ICD-10-CM

## 2012-10-12 DIAGNOSIS — R519 Headache, unspecified: Secondary | ICD-10-CM

## 2012-12-13 ENCOUNTER — Telehealth: Payer: Self-pay | Admitting: Cardiovascular Disease

## 2012-12-13 DIAGNOSIS — Z79899 Other long term (current) drug therapy: Secondary | ICD-10-CM

## 2012-12-13 DIAGNOSIS — R5381 Other malaise: Secondary | ICD-10-CM

## 2012-12-13 DIAGNOSIS — E782 Mixed hyperlipidemia: Secondary | ICD-10-CM

## 2012-12-13 DIAGNOSIS — R5383 Other fatigue: Secondary | ICD-10-CM

## 2012-12-13 NOTE — Telephone Encounter (Signed)
Scheduled to see Dr Tresa Endo on Monday. Does she need lab work before appt?

## 2012-12-13 NOTE — Telephone Encounter (Signed)
Paper chart requested.

## 2012-12-14 NOTE — Telephone Encounter (Signed)
Returned call.  Pt informed lab(s) ordered and to present FASTING to any [facility] to have completed. Pt verbalized understanding and agreed w/ plan.  

## 2012-12-18 ENCOUNTER — Encounter: Payer: Self-pay | Admitting: *Deleted

## 2012-12-19 ENCOUNTER — Ambulatory Visit: Payer: Medicare Other | Admitting: Cardiovascular Disease

## 2012-12-19 ENCOUNTER — Encounter: Payer: Self-pay | Admitting: Cardiovascular Disease

## 2012-12-20 ENCOUNTER — Ambulatory Visit: Payer: Medicare Other | Admitting: Cardiovascular Disease

## 2013-01-11 ENCOUNTER — Ambulatory Visit: Payer: Medicare Other | Admitting: Cardiovascular Disease

## 2013-01-11 LAB — LIPID PANEL
LDL Cholesterol: 69 mg/dL (ref 0–99)
Triglycerides: 50 mg/dL (ref <150)
VLDL: 10 mg/dL (ref 0–40)

## 2013-01-11 LAB — CBC
MCH: 29.6 pg (ref 26.0–34.0)
MCHC: 32.4 g/dL (ref 30.0–36.0)
Platelets: 243 10*3/uL (ref 150–400)
RDW: 13.3 % (ref 11.5–15.5)

## 2013-01-11 LAB — COMPREHENSIVE METABOLIC PANEL
ALT: 19 U/L (ref 0–35)
AST: 16 U/L (ref 0–37)
Albumin: 4.1 g/dL (ref 3.5–5.2)
Alkaline Phosphatase: 91 U/L (ref 39–117)
Glucose, Bld: 99 mg/dL (ref 70–99)
Potassium: 5 mEq/L (ref 3.5–5.3)
Sodium: 142 mEq/L (ref 135–145)
Total Bilirubin: 0.3 mg/dL (ref 0.3–1.2)
Total Protein: 6.1 g/dL (ref 6.0–8.3)

## 2013-01-11 LAB — TSH: TSH: 1.547 u[IU]/mL (ref 0.350–4.500)

## 2013-01-13 ENCOUNTER — Ambulatory Visit (INDEPENDENT_AMBULATORY_CARE_PROVIDER_SITE_OTHER): Payer: Medicare Other | Admitting: Cardiovascular Disease

## 2013-01-13 VITALS — BP 110/60 | HR 68 | Ht 64.0 in | Wt 136.0 lb

## 2013-01-13 DIAGNOSIS — E785 Hyperlipidemia, unspecified: Secondary | ICD-10-CM

## 2013-01-13 DIAGNOSIS — F17200 Nicotine dependence, unspecified, uncomplicated: Secondary | ICD-10-CM

## 2013-01-13 DIAGNOSIS — I2510 Atherosclerotic heart disease of native coronary artery without angina pectoris: Secondary | ICD-10-CM

## 2013-01-13 DIAGNOSIS — I10 Essential (primary) hypertension: Secondary | ICD-10-CM

## 2013-01-13 DIAGNOSIS — Z720 Tobacco use: Secondary | ICD-10-CM

## 2013-01-13 MED ORDER — EZETIMIBE 10 MG PO TABS: 10.0000 mg | ORAL_TABLET | Freq: Every day | ORAL | Status: DC

## 2013-01-13 MED ORDER — ATORVASTATIN CALCIUM 80 MG PO TABS: 80.0000 mg | ORAL_TABLET | Freq: Every day | ORAL | Status: DC

## 2013-01-13 MED ORDER — NITROGLYCERIN 0.4 MG SL SUBL: 0.4000 mg | SUBLINGUAL_TABLET | SUBLINGUAL | Status: DC | PRN

## 2013-01-13 MED ORDER — DILTIAZEM HCL ER COATED BEADS 240 MG PO CP24: 240.0000 mg | ORAL_CAPSULE | Freq: Every day | ORAL | Status: DC

## 2013-01-13 NOTE — Patient Instructions (Signed)
Your physician recommends that you schedule a follow-up appointment in: 1 YEAR. No changes were made today in your therapy. 

## 2013-01-16 ENCOUNTER — Encounter: Payer: Self-pay | Admitting: Cardiovascular Disease

## 2013-01-16 NOTE — Progress Notes (Signed)
Patient ID: Judith Stevens, female   DOB: Dec 24, 1940, 72 y.o.   MRN: [id number]     HPI: Judith Stevens is a 72 y.o. female who presents for a one-year cardiology evaluation.  Judith Stevens has established coronary artery disease in 2001 underwent cardiac catheterization while in [place] which showed a 60% ostial narrowing of her LAD. Repeat catheterization by me in 2009 did not show any significant interval change. Unfortunately, she has continued her ongoing tobacco habit but is now smoking significantly reduced at less than one quarter pack per day.  She does have a history of hypertension as well as hyperlipidemia.  Her last nuclear perfusion study was in August 2013 which was unchanged from 2 years previously and continued to show normal perfusion.  Over the past year, she denies recurrent chest pain. She does have a prescription for Kantrex at home. She denies palpitations. She presents for one-year evaluation.  Past Medical History  Diagnosis Date  . Hypertension   . Hyperlipidemia   . CAD (coronary artery disease)     single vessel  . Tobacco abuse   . Pulmonary emboli 5/12  . Hx of echocardiogram 09/2009    EF >55% Normal Echo  . History of stress test 10/2011    Resting images reveal a normal pattern of perfusion in all regions. The post stress myocardial perfusion images show a normal pattern of perfusion in all region. The post stress left ventricle is normal size. the rest left ventricle is normal size, there is no scintigraphic evidence of inductible myocardial ischemia.    Past Surgical History  Procedure Laterality Date  . Knee surgery  01/17/2007    right  . Foot surgery  2002,2013    right  . Cardiac catheterization  10/13/2007    60% narrowing twin LAD system  . Back surgery  2012    Allergies  Allergen Reactions  . Dairy Aid [Lactase]   . Levaquin [Levofloxacin]   . Naproxen     Shortness of breath  . Peanuts [Peanut Oil]   . Penicillins   .  Percocet [Oxycodone-Acetaminophen]   . Red Dye   . Tetracyclines & Related     Current Outpatient Prescriptions  Medication Sig Dispense Refill  . albuterol (PROVENTIL HFA;VENTOLIN HFA) 108 (90 BASE) MCG/ACT inhaler Inhale 2 puffs into the lungs every 6 (six) hours as needed for wheezing.      Marland Kitchen ALPRAZolam (XANAX) 0.25 MG tablet Take 0.25 mg by mouth at bedtime as needed for sleep.      Marland Kitchen atorvastatin (LIPITOR) 80 MG tablet Take 1 tablet (80 mg total) by mouth daily.  90 tablet  3  . Butalbital-APAP-Caffeine (FIORICET) 50-300-40 MG CAPS Take 1 capsule by mouth every 4 (four) hours as needed.      . calcium-vitamin D (OSCAL WITH D) 500-200 MG-UNIT per tablet Take 2 tablets by mouth.      . diltiazem (CARDIZEM CD) 240 MG 24 hr capsule Take 1 capsule (240 mg total) by mouth daily.  90 capsule  3  . escitalopram (LEXAPRO) 20 MG tablet Take 20 mg by mouth daily.      Marland Kitchen ezetimibe (ZETIA) 10 MG tablet Take 1 tablet (10 mg total) by mouth daily.  90 tablet  3  . fluticasone (VERAMYST) 27.5 MCG/SPRAY nasal spray Place 2 sprays into the nose daily as needed for rhinitis.      . fluticasone-salmeterol (ADVAIR HFA) 115-21 MCG/ACT inhaler Inhale 2 puffs into the lungs 2 (two) times  daily.      . levocetirizine (XYZAL) 5 MG tablet Take 5 mg by mouth every evening.      . montelukast (SINGULAIR) 10 MG tablet Take 10 mg by mouth at bedtime.      . nitroGLYCERIN (NITROSTAT) 0.4 MG SL tablet Place 1 tablet (0.4 mg total) under the tongue every 5 (five) minutes as needed for chest pain.  25 tablet  6  . Olopatadine HCl (PATADAY) 0.2 % SOLN Apply 1 drop to eye daily as needed.      . Olopatadine HCl (PATANASE) 0.6 % SOLN Place 2 drops into the nose 2 (two) times daily as needed.      . ondansetron (ZOFRAN) 4 MG tablet Take 4 mg by mouth every 8 (eight) hours as needed for nausea.      . pantoprazole (PROTONIX) 40 MG tablet Take 40 mg by mouth 2 (two) times daily.      . terbutaline (BRETHINE) 5 MG tablet Take 5  mg by mouth every 6 (six) hours.       No current facility-administered medications for this visit.    History   Social History  . Marital Status: Widowed    Spouse Name: N/A    Number of Children: N/A  . Years of Education: N/A   Occupational History  . Not on file.   Social History Main Topics  . Smoking status: Current Every Day Smoker -- 0.33 packs/day    Types: Cigarettes  . Smokeless tobacco: Not on file  . Alcohol Use: Yes     Comment: seldom  . Drug Use: No  . Sexual Activity: Not on file   Other Topics Concern  . Not on file   Social History Narrative  . No narrative on file    Family History  Problem Relation Age of Onset  . Heart attack Mother   . Diabetes Mother   . Heart attack Father   . Diabetes Sister   . Hyperlipidemia Sister    Socially, she is widowed. She has 2 children 5 grandchildren. She does not exercise. She currently smokes one quarter pack per day. There is occasional alcohol use.  ROS is negative for fevers, chills or night sweats.  She denies skin rash. She denies visual symptoms. She denies sputum production. She denies wheezing. She denies presyncope or syncope. There are no tachycardia palpitations. She denies chest pressure. She denies nausea vomiting diarrhea. There is no blood loss. She denies claudication. She did have foot surgery in September 2013. She denies myalgias. She broke her left wrist in July 2014.  Other comprehensive 12 point system review is negative.  PE BP 110/60  Pulse 68  Ht 5\' 4"  (1.626 m)  Wt 136 lb (61.689 kg)  BMI 23.33 kg/m2  General: Alert, oriented, no distress.  Skin: normal turgor, no rashes HEENT: Normocephalic, atraumatic. Pupils round and reactive; sclera anicteric;no lid lag.  Nose without nasal septal hypertrophy Mouth/Parynx benign; Mallinpatti scale 3 Neck: No JVD, no carotid briuts Lungs: clear to ausculatation and percussion; no wheezing or rales Heart: RRR, s1 s2 normal 1/6 sem Abdomen:  soft, nontender; no hepatosplenomehaly, BS+; abdominal aorta nontender and not dilated by palpation. Pulses 2+ Extremities: no clubbing cyanosis or edema, Homan's sign negative  Neurologic: grossly nonfocal Psychologic: normal affect and mood.  ECG: Sinus rhythm with occasional isolated PVC. Heart rate 68 beats per minute. QTc interval 440 ms.  LABS:  BMET    Component Value Date/Time   NA 142 01/11/2013  4540   K 5.0 01/11/2013 0812   CL 105 01/11/2013 0812   CO2 30 01/11/2013 0812   GLUCOSE 99 01/11/2013 0812   BUN 11 01/11/2013 0812   CREATININE 0.47* 01/11/2013 0812   CREATININE <0.47 08/07/2010 1237   CALCIUM 9.3 01/11/2013 0812   GFRNONAA NOT CALCULATED 08/07/2010 1237   GFRAA  Value: NOT CALCULATED        The eGFR has been calculated using the MDRD equation. This calculation has not been validated in all clinical situations. eGFR's persistently <60 mL/min signify possible Chronic Kidney Disease. 08/07/2010 1237     Hepatic Function Panel     Component Value Date/Time   PROT 6.1 01/11/2013 0812   ALBUMIN 4.1 01/11/2013 0812   AST 16 01/11/2013 0812   ALT 19 01/11/2013 0812   ALKPHOS 91 01/11/2013 0812   BILITOT 0.3 01/11/2013 0812     CBC    Component Value Date/Time   WBC 5.9 01/11/2013 0812   RBC 4.15 01/11/2013 0812   HGB 12.3 01/11/2013 0812   HCT 38.0 01/11/2013 0812   PLT 243 01/11/2013 0812   MCV 91.6 01/11/2013 0812   MCH 29.6 01/11/2013 0812   MCHC 32.4 01/11/2013 0812   RDW 13.3 01/11/2013 0812   LYMPHSABS 1.8 08/01/2010 1405   MONOABS 1.2* 08/01/2010 1405   EOSABS 0.0 08/01/2010 1405   BASOSABS 0.0 08/01/2010 1405     BNP No results found for this basename: probnp    Lipid Panel     Component Value Date/Time   CHOL 157 01/11/2013 0812   TRIG 50 01/11/2013 0812   HDL 78 01/11/2013 0812   CHOLHDL 2.0 01/11/2013 0812   VLDL 10 01/11/2013 0812   LDLCALC 69 01/11/2013 0812     RADIOLOGY: No results found.    ASSESSMENT AND PLAN:  Ms.  Happel has known coronary artery disease and underwent initial cardiac catheterization 13 years ago which showed 60% ostial LAD narrowing. At her last catheterization in July 2009 there was again smooth narrowing of her LAD ostium. There was minimal 10% narrowing a right coronary artery. We can discuss the cessation of all tobacco use. She currently has been drinking 6-7 cups of coffee per day and I suggested she reduce her caffeine intake. She denies anginal symptoms. I did review laboratory that she had done on 01/11/2013. She had normal renal function. Cholesterol is 157 triglycerides 50 HDL 78 LDL 69 on atorvastatin 80 mg and Zetia 10 mg. She takes Protonix for GERD symptoms. Her blood pressure is well-controlled on diltiazem 240 mg daily As long as she remains stable, I will see her in one year for evaluation    Lennette Bihari, MD, [facility]  01/16/2013 3:42 PM

## 2013-01-17 ENCOUNTER — Encounter: Payer: Self-pay | Admitting: Cardiovascular Disease

## 2013-04-07 ENCOUNTER — Telehealth: Payer: Self-pay | Admitting: Cardiovascular Disease

## 2013-04-07 NOTE — Telephone Encounter (Signed)
Returned call and pt verified x 2.  Pt informed message received and refill will be deferred to Dr. Pierre BaliKelly/Wanda, CMA.  Pt verbalized understanding and agreed w/ plan.  [facility] and one [facility] to [facility] to [facility].  Message forwarded to Dr. Pierre BaliKelly/Wanda, CMA.

## 2013-04-07 NOTE — Telephone Encounter (Signed)
Need a new prescription for her Lexapro for her local pharmacy [phone]. She need this until she get a prescription for this for her mail order pharmacy. Would you please take call this today and mail her the prescription when you get time..Judith Stevens

## 2013-04-11 NOTE — Telephone Encounter (Signed)
Message sent to Dr. Pierre BaliKelly/Wanda, CMA to check on refill.

## 2013-04-11 NOTE — Telephone Encounter (Signed)
PT was calling in regards to her Lexapro, she needs to have a prescription [facility] to [facility] and one mailed to her so she can mail it to her mail order pharmacy. She stated that she is out of the medication and [facility] has not [facility] yet.

## 2013-04-12 NOTE — Telephone Encounter (Signed)
Pt called again today-still waiting on her Lexapro.

## 2013-04-12 NOTE — Telephone Encounter (Signed)
FORWARD TO  KRISTIN 

## 2013-04-13 MED ORDER — ESCITALOPRAM OXALATE 20 MG PO TABS: 20.0000 mg | ORAL_TABLET | Freq: Every day | ORAL | Status: DC

## 2013-04-13 NOTE — Telephone Encounter (Signed)
Reviewed with Dr. Tresa EndoKelly, [facility] and send 90 day w/o refills to pt (she mails rx to mail order herself).  She will then ask her PCP to take care of future refills.  Pt voiced understanding.

## 2013-06-12 ENCOUNTER — Telehealth: Payer: Self-pay | Admitting: *Deleted

## 2013-06-12 NOTE — Telephone Encounter (Signed)
Pt stated that she is having a hard time breathing and wanted to see Dr. Tresa EndoKelly asap. He does not have any openings until April 20th. I offered a PA and she said no she wants to see him. She would like a call back regarding this.  TK

## 2013-06-13 ENCOUNTER — Other Ambulatory Visit: Payer: Self-pay | Admitting: Family Medicine

## 2013-06-13 DIAGNOSIS — R0609 Other forms of dyspnea: Secondary | ICD-10-CM

## 2013-06-13 DIAGNOSIS — R509 Fever, unspecified: Secondary | ICD-10-CM

## 2013-06-13 DIAGNOSIS — R0989 Other specified symptoms and signs involving the circulatory and respiratory systems: Secondary | ICD-10-CM

## 2013-06-13 NOTE — Telephone Encounter (Signed)
Returned a call to patient confirming what tina had already told her that Dr. Tresa EndoKelly doesn't have any open appointments before the end of April. I stressed to her that our extenders are very capable of treating her. Informed her that if she would feel more comfortable we can schedule her to see the extender on a day that Dr. Tresa EndoKelly is also in the office. Patient declined stating that she has appointment with her PCP today. I recommended to her to go ahead with that appointment to see if her symptoms are even cardiac vs pulmonary. She can call me back after her evaluation to let me know if she wants to move her appointment from Dr. Tresa EndoKelly on 07/27/13 to a sooner appointment with a extender. Patient agreed with this plan.

## 2013-06-14 ENCOUNTER — Ambulatory Visit
Admission: RE | Admit: 2013-06-14 | Discharge: 2013-06-14 | Disposition: A | Discharge status: Home / Self Care | Payer: Medicare Other | Source: Ambulatory Visit | Attending: Family Medicine | Admitting: Family Medicine

## 2013-06-14 DIAGNOSIS — R0989 Other specified symptoms and signs involving the circulatory and respiratory systems: Secondary | ICD-10-CM

## 2013-06-14 DIAGNOSIS — R509 Fever, unspecified: Secondary | ICD-10-CM

## 2013-06-14 DIAGNOSIS — R0609 Other forms of dyspnea: Secondary | ICD-10-CM

## 2013-06-14 MED ORDER — IOHEXOL 350 MG/ML SOLN
100.0000 mL | Freq: Once | INTRAVENOUS | Status: AC | PRN
  Administered 2013-06-14: 100 mL via INTRAVENOUS

## 2013-07-27 ENCOUNTER — Ambulatory Visit: Payer: Medicare Other | Admitting: Cardiovascular Disease

## 2013-12-14 ENCOUNTER — Telehealth: Payer: Self-pay | Admitting: Cardiovascular Disease

## 2013-12-14 DIAGNOSIS — Z79899 Other long term (current) drug therapy: Secondary | ICD-10-CM

## 2013-12-14 DIAGNOSIS — E782 Mixed hyperlipidemia: Secondary | ICD-10-CM

## 2013-12-14 DIAGNOSIS — R5383 Other fatigue: Secondary | ICD-10-CM

## 2013-12-14 DIAGNOSIS — R5381 Other malaise: Secondary | ICD-10-CM

## 2013-12-14 NOTE — Telephone Encounter (Signed)
Order placed for labs and mailed to patient.

## 2013-12-14 NOTE — Telephone Encounter (Signed)
Judith Stevens is requesting a lab order be sent to her before her appt on 01/30/14 .Marland Kitchen Thanks

## 2014-01-25 LAB — CBC
HCT: 36.6 % (ref 36.0–46.0)
Hemoglobin: 12.3 g/dL (ref 12.0–15.0)
MCH: 30.3 pg (ref 26.0–34.0)
MCHC: 33.6 g/dL (ref 30.0–36.0)
MCV: 90.1 fL (ref 78.0–100.0)
Platelets: 222 K/uL (ref 150–400)
RBC: 4.06 MIL/uL (ref 3.87–5.11)
RDW: 14.5 % (ref 11.5–15.5)
WBC: 5.6 K/uL (ref 4.0–10.5)

## 2014-01-25 LAB — COMPREHENSIVE METABOLIC PANEL WITH GFR
ALT: 14 U/L (ref 0–35)
AST: 15 U/L (ref 0–37)
Albumin: 4.3 g/dL (ref 3.5–5.2)
Alkaline Phosphatase: 98 U/L (ref 39–117)
BUN: 14 mg/dL (ref 6–23)
CO2: 27 meq/L (ref 19–32)
Calcium: 9 mg/dL (ref 8.4–10.5)
Chloride: 106 meq/L (ref 96–112)
Creat: 0.5 mg/dL (ref 0.50–1.10)
Glucose, Bld: 96 mg/dL (ref 70–99)
Potassium: 5.1 meq/L (ref 3.5–5.3)
Sodium: 141 meq/L (ref 135–145)
Total Bilirubin: 0.3 mg/dL (ref 0.2–1.2)
Total Protein: 6.4 g/dL (ref 6.0–8.3)

## 2014-01-25 LAB — TSH: TSH: 0.993 u[IU]/mL (ref 0.350–4.500)

## 2014-01-25 LAB — LIPID PANEL
Cholesterol: 156 mg/dL (ref 0–200)
HDL: 75 mg/dL (ref >39)
LDL Cholesterol: 73 mg/dL (ref 0–99)
Total CHOL/HDL Ratio: 2.1 ratio
Triglycerides: 40 mg/dL (ref <150)
VLDL: 8 mg/dL (ref 0–40)

## 2014-01-30 ENCOUNTER — Telehealth: Payer: Self-pay | Admitting: *Deleted

## 2014-01-30 ENCOUNTER — Ambulatory Visit (INDEPENDENT_AMBULATORY_CARE_PROVIDER_SITE_OTHER): Payer: Medicare Other | Admitting: Cardiovascular Disease

## 2014-01-30 ENCOUNTER — Encounter: Payer: Self-pay | Admitting: Cardiovascular Disease

## 2014-01-30 VITALS — BP 120/70 | HR 72 | Ht 64.0 in | Wt 139.0 lb

## 2014-01-30 DIAGNOSIS — I2583 Coronary atherosclerosis due to lipid rich plaque: Secondary | ICD-10-CM

## 2014-01-30 DIAGNOSIS — I10 Essential (primary) hypertension: Secondary | ICD-10-CM

## 2014-01-30 DIAGNOSIS — Z01818 Encounter for other preprocedural examination: Secondary | ICD-10-CM

## 2014-01-30 DIAGNOSIS — Z720 Tobacco use: Secondary | ICD-10-CM

## 2014-01-30 DIAGNOSIS — E785 Hyperlipidemia, unspecified: Secondary | ICD-10-CM

## 2014-01-30 DIAGNOSIS — I2510 Atherosclerotic heart disease of native coronary artery without angina pectoris: Secondary | ICD-10-CM

## 2014-01-30 MED ORDER — EZETIMIBE 10 MG PO TABS: 10.0000 mg | ORAL_TABLET | Freq: Every day | ORAL | Status: DC

## 2014-01-30 MED ORDER — ATORVASTATIN CALCIUM 80 MG PO TABS: 80.0000 mg | ORAL_TABLET | Freq: Every day | ORAL | Status: DC

## 2014-01-30 MED ORDER — DILTIAZEM HCL ER COATED BEADS 240 MG PO CP24: 240.0000 mg | ORAL_CAPSULE | Freq: Every day | ORAL | Status: DC

## 2014-01-30 NOTE — Patient Instructions (Signed)
Your physician has requested that you have a lexiscan myoview the 1st week in January. For further information please visit [email or web address] Please follow instruction sheet, as given.  Your physician recommends that you schedule a follow-up appointment in:  The next week following the lexiscan.

## 2014-01-30 NOTE — Progress Notes (Signed)
Patient ID: Judith Stevens, female   DOB: [birth year], 73 y.o.   MRN: [id number]     HPI: Judith Stevens is a 73 y.o. female who presents for a one-year cardiology evaluation.  Judith Stevens has established coronary artery disease in 2001 underwent cardiac catheterization while in [place] which showed a 60% ostial narrowing of her LAD. Repeat catheterization by me in 2009 did not show any significant interval change. Unfortunately, she has continued her ongoing tobacco habit but is now smoking significantly reduced at less than one quarter pack per day.  She does have a history of hypertension as well as hyperlipidemia.  Her last nuclear perfusion study in August 2013 which was unchanged from 2 years previously and continued to show normal perfusion.   Over the past year, she denies recurrent chest pain. She had been given a prescription for Chantix in the past but has not used this due to cost.  She tells me that she will be needing significant surgery in January and will be immobile several weeks.  Her family member will be coming to live with her during this..  The patient states that she had her last cigarette today at 10:30 on 01/30/2014.  She admits to mild shortness of breath with activity.  She denies chest tightness.  She is unaware of palpitations.  She denies presyncope.  Past Medical History  Diagnosis Date  . Hypertension   . Hyperlipidemia   . CAD (coronary artery disease)     single vessel  . Tobacco abuse   . Pulmonary emboli 5/12  . Hx of echocardiogram 09/2009    EF >55% Normal Echo  . History of stress test 10/2011    Resting images reveal a normal pattern of perfusion in all regions. The post stress myocardial perfusion images show a normal pattern of perfusion in all region. The post stress left ventricle is normal size. the rest left ventricle is normal size, there is no scintigraphic evidence of inductible myocardial ischemia.    Past Surgical History    Procedure Laterality Date  . Knee surgery  01/17/2007    right  . Foot surgery  2002,2013    right  . Cardiac catheterization  10/13/2007    60% narrowing twin LAD system  . Back surgery  2012    Allergies  Allergen Reactions  . Dairy Aid [Lactase]   . Levaquin [Levofloxacin]   . Naproxen     Shortness of breath  . Peanuts [Peanut Oil]   . Penicillins   . Percocet [Oxycodone-Acetaminophen]   . Red Dye   . Tetracyclines & Related     Current Outpatient Prescriptions  Medication Sig Dispense Refill  . albuterol (PROVENTIL HFA;VENTOLIN HFA) 108 (90 BASE) MCG/ACT inhaler Inhale 2 puffs into the lungs every 6 (six) hours as needed for wheezing.    Marland Kitchen ALPRAZolam (XANAX) 0.25 MG tablet Take 0.25 mg by mouth at bedtime as needed for sleep.    Marland Kitchen atorvastatin (LIPITOR) 80 MG tablet Take 1 tablet (80 mg total) by mouth daily. 90 tablet 3  . butalbital-acetaminophen-caffeine (FIORICET WITH CODEINE) 50-325-40-30 MG per capsule Take 1 capsule by mouth daily as needed.  5  . Cholecalciferol (VITAMIN D3) 2000 UNITS TABS Take 1 tablet by mouth daily.    Marland Kitchen diltiazem (CARDIZEM CD) 240 MG 24 hr capsule Take 1 capsule (240 mg total) by mouth daily. 90 capsule 3  . escitalopram (LEXAPRO) 20 MG tablet Take 1 tablet (20 mg total) by mouth daily. [place]  tablet 0  . ezetimibe (ZETIA) 10 MG tablet Take 1 tablet (10 mg total) by mouth daily. 90 tablet 3  . fluticasone (VERAMYST) 27.5 MCG/SPRAY nasal spray Place 2 sprays into the nose daily as needed for rhinitis.    . fluticasone-salmeterol (ADVAIR HFA) 115-21 MCG/ACT inhaler Inhale 2 puffs into the lungs 2 (two) times daily.    . Multiple Vitamin (MULTI-VITAMIN DAILY PO) Take 1 tablet by mouth daily.    . nitroGLYCERIN (NITROSTAT) 0.4 MG SL tablet Place 1 tablet (0.4 mg total) under the tongue every 5 (five) minutes as needed for chest pain. 25 tablet 6  . ondansetron (ZOFRAN) 4 MG tablet Take 4 mg by mouth every 8 (eight) hours as needed for nausea.    .  pantoprazole (PROTONIX) 40 MG tablet Take 40 mg by mouth 2 (two) times daily.    . terbutaline (BRETHINE) 5 MG tablet Take 5 mg by mouth every 6 (six) hours.     No current facility-administered medications for this visit.    History   Social History  . Marital Status: Widowed    Spouse Name: N/A    Number of Children: N/A  . Years of Education: N/A   Occupational History  . Not on file.   Social History Main Topics  . Smoking status: Former Smoker -- 0.00 packs/day    Types: Cigarettes    Start date: 01/30/2014  . Smokeless tobacco: Not on file  . Alcohol Use: 0.0 oz/week    0 Not specified per week     Comment: seldom  . Drug Use: No  . Sexual Activity: Not on file   Other Topics Concern  . Not on file   Social History Narrative    Family History  Problem Relation Age of Onset  . Heart attack Mother   . Diabetes Mother   . Heart attack Father   . Diabetes Sister   . Hyperlipidemia Sister    Socially, she is widowed. She has 2 children 5 grandchildren. She does not exercise. She currently smokes one quarter pack per day. There is occasional alcohol use.  ROS General: Negative; No fevers, chills, or night sweats;  HEENT: Negative; No changes in vision or hearing, sinus congestion, difficulty swallowing Pulmonary: Negative; No cough, wheezing, shortness of breath, hemoptysis Cardiovascular: Negative; No chest pain, presyncope, syncope, palpitations GI: positive for GERD; No nausea, vomiting, diarrhea, or abdominal pain GU: Negative; No dysuria, hematuria, or difficulty voiding Musculoskeletal: significant sciatica symptoms.  She will require surgery in January. Hematologic/Oncology: Negative; no easy bruising, bleeding Endocrine: Negative; no heat/cold intolerance; no diabetes Neuro: Negative; no changes in balance, headaches Skin: Negative; No rashes or skin lesions Psychiatric: Negative; No behavioral problems, depression Sleep: Negative; No snoring, daytime  sleepiness, hypersomnolence, bruxism, restless legs, hypnogognic hallucinations, no cataplexy Other comprehensive 14 point system review is negative.  Nutritionally she cannot have dairy products.  PE BP 120/70 mmHg  Pulse 72  Ht $R'5\' 4"'CP$  (1.626 m)  Wt 139 lb (63.05 kg)  BMI 23.85 kg/m2  General: Alert, oriented, no distress.  Skin: normal turgor, no rashes HEENT: Normocephalic, atraumatic. Pupils round and reactive; sclera anicteric;no lid lag.  Nose without nasal septal hypertrophy Mouth/Parynx benign; Mallinpatti scale 3 Neck: No JVD, no carotid bruitswith normal carotid upstroke Chest wall: Nontender to palpationLungs: clear to ausculatation and percussion; no wheezing or rales Heart: RRR, s1 s2 normal 1/6 sem; .Marland Kitchen  No diastolic murmur.  No S3 or S4 gallop.  No rubs thrills or heaves.  Abdomen: soft, nontender; no hepatosplenomehaly, BS+; abdominal aorta nontender and not dilated by palpation. Back: No CVA tenderness Pulses 2+ Extremities: no clubbing cyanosis or edema, Homan's sign negative  Neurologic: grossly nonfocal Psychologic: normal affect and mood.  ECG (independently read by me): Normal sinus rhythm at 72 bpm.  Normal intervals.  No significant ST-T changes.  Prior October 2014 ECG: Sinus rhythm with occasional isolated PVC. Heart rate 68 beats per minute. QTc interval 440 ms.  LABS:  BMET    Component Value Date/Time   NA 141 01/25/2014 0908   K 5.1 01/25/2014 0908   CL 106 01/25/2014 0908   CO2 27 01/25/2014 0908   GLUCOSE 96 01/25/2014 0908   BUN 14 01/25/2014 0908   CREATININE 0.50 01/25/2014 0908   CREATININE <0.47 08/07/2010 1237   CALCIUM 9.0 01/25/2014 0908   GFRNONAA NOT CALCULATED 08/07/2010 1237   GFRAA  08/07/2010 1237    NOT CALCULATED        The eGFR has been calculated using the MDRD equation. This calculation has not been validated in all clinical situations. eGFR's persistently <60 mL/min signify possible Chronic Kidney Disease.      Hepatic Function Panel     Component Value Date/Time   PROT 6.4 01/25/2014 0908   ALBUMIN 4.3 01/25/2014 0908   AST 15 01/25/2014 0908   ALT 14 01/25/2014 0908   ALKPHOS 98 01/25/2014 0908   BILITOT 0.3 01/25/2014 0908     CBC    Component Value Date/Time   WBC 5.6 01/25/2014 0908   RBC 4.06 01/25/2014 0908   HGB 12.3 01/25/2014 0908   HCT 36.6 01/25/2014 0908   PLT 222 01/25/2014 0908   MCV 90.1 01/25/2014 0908   MCH 30.3 01/25/2014 0908   MCHC 33.6 01/25/2014 0908   RDW 14.5 01/25/2014 0908   LYMPHSABS 1.8 08/01/2010 1405   MONOABS 1.2* 08/01/2010 1405   EOSABS 0.0 08/01/2010 1405   BASOSABS 0.0 08/01/2010 1405     BNP No results found for: PROBNP  Lipid Panel     Component Value Date/Time   CHOL 156 01/25/2014 0908   TRIG 40 01/25/2014 0908   HDL 75 01/25/2014 0908   CHOLHDL 2.1 01/25/2014 0908   VLDL 8 01/25/2014 0908   LDLCALC 73 01/25/2014 0908     RADIOLOGY: No results found.    ASSESSMENT AND PLAN:  Judith Stevens is a 74 year old female who has a long-standing history of tobacco use and has been documented to have 60% ostial LAD stenosis dating back to 2001 in which again was noted on her repeat catheterization by me in 2009.  We have been aggressively treating her lipids in attempt to induce plaque regression and she is tolerated Lipitor 80 mg in addition to Zetia 10 mg.  Her blood pressure today is controlled on Cardizem CD 240 mg.  She has GERD but this is controlled with Protonix.  We had a long discussion concerning complete smoking cessation.  She states that she is now committed to quit this habit and states that her last cigarette was right before her office visit. I did review recent blood work from 5 days ago which showed a normal CBC, chemistry profile, and thyroid studies.  Her lipids were excellent with a total cholesterol 156, triglycerides 40, HDL 75, LDL 73.  Prior to her planned elective surgery in January, I have recommended she  undergo a follow-up [place] study to make certain there is no significant ischemia.  I will see her  back in the office after that study, and if stable, preoperative clearance will be given.  Troy Sine, MD, [facility]  01/30/2014 4:26 PM

## 2014-01-30 NOTE — Telephone Encounter (Signed)
Mailed patients prescription refills to her home address on 01/31/14.

## 2014-01-31 DIAGNOSIS — E785 Hyperlipidemia, unspecified: Secondary | ICD-10-CM | POA: Insufficient documentation

## 2014-02-01 ENCOUNTER — Encounter (HOSPITAL_COMMUNITY): Payer: Medicare Other

## 2014-02-12 ENCOUNTER — Encounter: Payer: Self-pay | Admitting: *Deleted

## 2014-02-21 ENCOUNTER — Telehealth (HOSPITAL_COMMUNITY): Payer: Self-pay

## 2014-02-21 NOTE — Telephone Encounter (Signed)
Encounter complete. 

## 2014-02-28 ENCOUNTER — Encounter (HOSPITAL_COMMUNITY): Payer: Medicare Other

## 2014-03-01 ENCOUNTER — Other Ambulatory Visit: Payer: Self-pay | Admitting: Orthopedic Surgery

## 2014-03-06 ENCOUNTER — Telehealth (HOSPITAL_COMMUNITY): Payer: Self-pay

## 2014-03-06 NOTE — Telephone Encounter (Signed)
Encounter complete.0

## 2014-03-07 ENCOUNTER — Telehealth (HOSPITAL_COMMUNITY): Payer: Self-pay

## 2014-03-07 NOTE — Telephone Encounter (Signed)
Encounter complete. 

## 2014-03-08 ENCOUNTER — Encounter (HOSPITAL_COMMUNITY): Payer: Medicare Other

## 2014-03-08 ENCOUNTER — Telehealth (HOSPITAL_COMMUNITY): Payer: Self-pay

## 2014-03-08 NOTE — Telephone Encounter (Signed)
Encounter complete. 

## 2014-03-12 ENCOUNTER — Ambulatory Visit: Payer: Medicare Other | Admitting: Cardiovascular Disease

## 2014-03-13 ENCOUNTER — Ambulatory Visit (HOSPITAL_COMMUNITY)
Admission: RE | Admit: 2014-03-13 | Discharge: 2014-03-13 | Disposition: A | Discharge status: Home / Self Care | Payer: Medicare Other | Source: Ambulatory Visit | Attending: Cardiovascular Disease | Admitting: Cardiovascular Disease

## 2014-03-13 DIAGNOSIS — I2510 Atherosclerotic heart disease of native coronary artery without angina pectoris: Secondary | ICD-10-CM | POA: Diagnosis not present

## 2014-03-13 DIAGNOSIS — Z01810 Encounter for preprocedural cardiovascular examination: Secondary | ICD-10-CM | POA: Insufficient documentation

## 2014-03-13 DIAGNOSIS — M5430 Sciatica, unspecified side: Secondary | ICD-10-CM | POA: Diagnosis not present

## 2014-03-13 DIAGNOSIS — Z01818 Encounter for other preprocedural examination: Secondary | ICD-10-CM

## 2014-03-13 MED ORDER — TECHNETIUM TC 99M SESTAMIBI GENERIC - CARDIOLITE
10.5000 | Freq: Once | INTRAVENOUS | Status: AC | PRN
  Administered 2014-03-13: 11 via INTRAVENOUS

## 2014-03-13 MED ORDER — REGADENOSON 0.4 MG/5ML IV SOLN
0.4000 mg | Freq: Once | INTRAVENOUS | Status: AC
  Administered 2014-03-13: 0.4 mg via INTRAVENOUS

## 2014-03-13 MED ORDER — TECHNETIUM TC 99M SESTAMIBI GENERIC - CARDIOLITE
31.9000 | Freq: Once | INTRAVENOUS | Status: AC | PRN
  Administered 2014-03-13: 31.9 via INTRAVENOUS

## 2014-03-13 MED ORDER — AMINOPHYLLINE 25 MG/ML IV SOLN
75.0000 mg | Freq: Once | INTRAVENOUS | Status: AC
  Administered 2014-03-13: 75 mg via INTRAVENOUS

## 2014-03-13 NOTE — Procedures (Addendum)
[facility] [facility] [address] [place] [address] [phone]  Cardiology Nuclear Med Study  Judith NeptuneJacqueline M Stevens is a 73 y.o. female     MRN : [id number]     DOB: 09/28/1940  Procedure Date: 03/13/2014  Nuclear Med Background Indication for Stress Test:  Evaluation for Ischemia and Surgical Clearance History:  Asthma and CAD;non-coronary vascular disease;Last NUC MPI on 11/11/2011-nonischemic;EF=59%;RCHO on 10/01/2009-normal;LVEF=55% Cardiac Risk Factors: Family History - CAD, History of Smoking, Hypertension, Lipids and PE-07/2010;  Symptoms:  Chest Pain, DOE and Fatigue   Nuclear Pre-Procedure Caffeine/Decaff Intake:  7:00pm NPO After: 5:00am   IV Site: R Forearm  IV 0.9% NS with Angio Cath:  22g  Chest Size (in):  n/a IV Started by: Berdie OgrenAmanda Wease, RN  Height: 5\' 4"  (1.626 m)  Cup Size: B  BMI:  Body mass index is 23.85 kg/(m^2). Weight:  139 lb (63.05 kg)   Tech Comments:  n/a    Nuclear Med Study 1 or 2 day study: 1 day  Stress Test Type:  Lexiscan  Order Authorizing Provider:  Nicki Guadalajarahomas Kelly, MD   Resting Radionuclide: Technetium 5641m Sestamibi  Resting Radionuclide Dose: 10.5 mCi   Stress Radionuclide:  Technetium 4641m Sestamibi  Stress Radionuclide Dose: 31.9  mCi           Stress Protocol Rest HR: 77 Stress HR: 113  Rest BP: 160/64 Stress BP: 188/72  Exercise Time (min): n/a METS: n/a          Dose of Adenosine (mg):  n/a Dose of Lexiscan: 0.4 mg  Dose of Atropine (mg): n/a Dose of Dobutamine: n/a mcg/kg/min (at max HR)  Stress Test Technologist: Ernestene MentionGwen Farrington, CCT Nuclear Technologist: Koren Shiverobin Moffitt, CNMT   Rest Procedure:  Myocardial perfusion imaging was performed at rest 45 minutes following the intravenous administration of Technetium 3341m Sestamibi. Stress Procedure:  The patient received IV Lexiscan 0.4 mg over 15-seconds.  Technetium 5341m Sestamibi injected IV at 30-seconds. Patient experienced  shortness of breath, tingling in fingers and was administered 75 mg of Aminophylline IV.  There were no significant changes with Lexiscan.  Quantitative spect images were obtained after a 45 minute delay.  Transient Ischemic Dilatation (Normal <1.22):  1.34  QGS EDV:  n/a ml QGS ESV:  n/a ml LV Ejection Fraction: Study not gated        Rest ECG: NSR - Normal EKG and PVCs  Stress ECG: No significant change from baseline ECG  QPS Raw Data Images:  Normal; no motion artifact; normal heart/lung ratio. Stress Images:  Normal homogeneous uptake in all areas of the myocardium. Rest Images:  Normal homogeneous uptake in all areas of the myocardium. Subtraction (SDS):  Normal  Impression Exercise Capacity:  Lexiscan with no exercise. BP Response:  Normal blood pressure response. Clinical Symptoms:  No significant symptoms noted. ECG Impression:  No significant ST segment change suggestive of ischemia. Comparison with Prior Nuclear Study: No significant change from previous study  Overall Impression:  Normal stress nuclear study.  LV Wall Motion:  No gating secondary to ectopy   Runell GessBERRY,Prentis Langdon J, MD  03/13/2014 1:39 PM

## 2014-03-14 NOTE — Pre-Procedure Instructions (Signed)
Judith NeptuneJacqueline M Stevens  03/14/2014   Your procedure is scheduled on:  Wed, Dec 30 @ 8:30 AM  [facility]  at 6:30 AM.  Call this number if you have problems the morning of surgery: [phone]   Remember:   Do not eat food or drink liquids after midnight.   Take these medicines the morning of surgery with A SIP OF WATER: Albuterol<Bring Your Inhaler With You>,Cardizem(Diltiazem),Escitalopram(Lexapro),Fluticasone-Veramyst-if needed),Advair(Fluticasone-Salmeterol),Zofran(Ondansetron-if needed),Pantoprazole(Protonix),and Terbutaline(Brethine-if needed)                  No Goody's,BC's,Aleve,Aspirin,Ibuprofen,Fish Oil,or any Herbal Medications   Do not wear jewelry, make-up or nail polish.  Do not wear lotions, powders, or perfumes. You may wear deodorant.  Do not shave 48 hours prior to surgery.   Do not bring valuables to the hospital.  [facility] is not responsible                  for any belongings or valuables.               Contacts, dentures or bridgework may not be worn into surgery.  Leave suitcase in the car. After surgery it may be brought to your room.  For patients admitted to the hospital, discharge time is determined by your                treatment team.               Patients discharged the day of surgery will not be allowed to drive  home.    Special Instructions:  [facility] - Preparing for Surgery  Before surgery, you can play an important role.  Because skin is not sterile, your skin needs to be as free of germs as possible.  You can reduce the number of germs on you skin by washing with CHG (chlorahexidine gluconate) soap before surgery.  CHG is an antiseptic cleaner which kills germs and bonds with the skin to continue killing germs even after washing.  Please DO NOT use if you have an allergy to CHG or antibacterial soaps.  If your skin becomes reddened/irritated stop using the CHG and inform your nurse when you arrive at Short Stay.  Do not  shave (including legs and underarms) for at least 48 hours prior to the first CHG shower.  You may shave your face.  Please follow these instructions carefully:   1.  Shower with CHG Soap the night before surgery and the                                morning of Surgery.  2.  If you choose to wash your hair, wash your hair first as usual with your       normal shampoo.  3.  After you shampoo, rinse your hair and body thoroughly to remove the                      Shampoo.  4.  Use CHG as you would any other liquid soap.  You can apply chg directly       to the skin and wash gently with scrungie or a clean washcloth.  5.  Apply the CHG Soap to your body ONLY FROM THE NECK DOWN.        Do not use on open wounds or open sores.  Avoid contact with your eyes,  ears, mouth and genitals (private parts).  Wash genitals (private parts)       with your normal soap.  6.  Wash thoroughly, paying special attention to the area where your surgery        will be performed.  7.  Thoroughly rinse your body with warm water from the neck down.  8.  DO NOT shower/wash with your normal soap after using and rinsing off       the CHG Soap.  9.  Pat yourself dry with a clean towel.            10.  Wear clean pajamas.            11.  Place clean sheets on your bed the night of your first shower and do not        sleep with pets.  Day of Surgery  Do not apply any lotions/deoderants the morning of surgery.  Please wear clean clothes to the hospital/surgery center.     Please read over the following fact sheets that you were given: Pain Booklet, Coughing and Deep Breathing, Blood Transfusion Information, MRSA Information and Surgical Site Infection Prevention

## 2014-03-15 ENCOUNTER — Inpatient Hospital Stay (HOSPITAL_COMMUNITY)
Admission: RE | Admit: 2014-03-15 | Discharge: 2014-03-15 | Disposition: A | Discharge status: Home / Self Care | Payer: Medicare Other | Source: Ambulatory Visit

## 2014-03-15 ENCOUNTER — Encounter: Payer: Self-pay | Admitting: *Deleted

## 2014-03-21 ENCOUNTER — Encounter (HOSPITAL_COMMUNITY): Payer: Self-pay

## 2014-03-21 ENCOUNTER — Encounter (HOSPITAL_COMMUNITY)
Admission: RE | Admit: 2014-03-21 | Discharge: 2014-03-21 | Disposition: A | Discharge status: Home / Self Care | Payer: Medicare Other | Source: Ambulatory Visit | Attending: Orthopedic Surgery | Admitting: Orthopedic Surgery

## 2014-03-21 DIAGNOSIS — I10 Essential (primary) hypertension: Secondary | ICD-10-CM | POA: Insufficient documentation

## 2014-03-21 DIAGNOSIS — M533 Sacrococcygeal disorders, not elsewhere classified: Secondary | ICD-10-CM | POA: Insufficient documentation

## 2014-03-21 DIAGNOSIS — Z01818 Encounter for other preprocedural examination: Secondary | ICD-10-CM | POA: Insufficient documentation

## 2014-03-21 DIAGNOSIS — Z87891 Personal history of nicotine dependence: Secondary | ICD-10-CM | POA: Diagnosis not present

## 2014-03-21 DIAGNOSIS — Z01812 Encounter for preprocedural laboratory examination: Secondary | ICD-10-CM | POA: Diagnosis not present

## 2014-03-21 DIAGNOSIS — J984 Other disorders of lung: Secondary | ICD-10-CM | POA: Insufficient documentation

## 2014-03-21 HISTORY — DX: Headache, unspecified: R51.9

## 2014-03-21 HISTORY — DX: Unspecified osteoarthritis, unspecified site: M19.90

## 2014-03-21 HISTORY — DX: Anxiety disorder, unspecified: F41.9

## 2014-03-21 HISTORY — DX: Unspecified asthma, uncomplicated: J45.909

## 2014-03-21 HISTORY — DX: Pneumonia, unspecified organism: J18.9

## 2014-03-21 HISTORY — DX: Migraine, unspecified, not intractable, without status migrainosus: G43.909

## 2014-03-21 HISTORY — DX: Reserved for inherently not codable concepts without codable children: IMO0001

## 2014-03-21 HISTORY — DX: Headache: R51

## 2014-03-21 HISTORY — DX: Gastro-esophageal reflux disease without esophagitis: K21.9

## 2014-03-21 LAB — COMPREHENSIVE METABOLIC PANEL
ALT: 19 U/L (ref 0–35)
ANION GAP: 4 — AB (ref 5–15)
AST: 18 U/L (ref 0–37)
Albumin: 3.9 g/dL (ref 3.5–5.2)
Alkaline Phosphatase: 101 U/L (ref 39–117)
BUN: 15 mg/dL (ref 6–23)
CALCIUM: 9.1 mg/dL (ref 8.4–10.5)
CO2: 31 mmol/L (ref 19–32)
Chloride: 107 mEq/L (ref 96–112)
Creatinine, Ser: 0.56 mg/dL (ref 0.50–1.10)
GFR calc Af Amer: >90 mL/min (ref >90)
GLUCOSE: 96 mg/dL (ref 70–99)
Potassium: 4.2 mmol/L (ref 3.5–5.1)
Sodium: 142 mmol/L (ref 135–145)
TOTAL PROTEIN: 5.9 g/dL — AB (ref 6.0–8.3)
Total Bilirubin: 0.3 mg/dL (ref 0.3–1.2)

## 2014-03-21 LAB — SURGICAL PCR SCREEN
MRSA, PCR: NEGATIVE
Staphylococcus aureus: NEGATIVE

## 2014-03-21 LAB — CBC WITH DIFFERENTIAL/PLATELET
Basophils Absolute: 0 10*3/uL (ref 0.0–0.1)
Basophils Relative: 0 % (ref 0–1)
EOS ABS: 0.1 10*3/uL (ref 0.0–0.7)
EOS PCT: 2 % (ref 0–5)
HCT: 35.5 % — ABNORMAL LOW (ref 36.0–46.0)
HEMOGLOBIN: 11.7 g/dL — AB (ref 12.0–15.0)
LYMPHS ABS: 1.9 10*3/uL (ref 0.7–4.0)
Lymphocytes Relative: 35 % (ref 12–46)
MCH: 30.5 pg (ref 26.0–34.0)
MCHC: 33 g/dL (ref 30.0–36.0)
MCV: 92.4 fL (ref 78.0–100.0)
Monocytes Absolute: 0.4 10*3/uL (ref 0.1–1.0)
Monocytes Relative: 7 % (ref 3–12)
Neutro Abs: 2.9 10*3/uL (ref 1.7–7.7)
Neutrophils Relative %: 56 % (ref 43–77)
Platelets: 220 10*3/uL (ref 150–400)
RBC: 3.84 MIL/uL — AB (ref 3.87–5.11)
RDW: 13.7 % (ref 11.5–15.5)
WBC: 5.3 10*3/uL (ref 4.0–10.5)

## 2014-03-21 LAB — URINALYSIS, ROUTINE W REFLEX MICROSCOPIC
BILIRUBIN URINE: NEGATIVE
GLUCOSE, UA: NEGATIVE mg/dL
Hgb urine dipstick: NEGATIVE
KETONES UR: NEGATIVE mg/dL
Nitrite: NEGATIVE
PROTEIN: NEGATIVE mg/dL
Specific Gravity, Urine: 1.01 (ref 1.005–1.030)
Urobilinogen, UA: 1 mg/dL (ref 0.0–1.0)
pH: 7 (ref 5.0–8.0)

## 2014-03-21 LAB — APTT: aPTT: 27 seconds (ref 24–37)

## 2014-03-21 LAB — URINE MICROSCOPIC-ADD ON

## 2014-03-21 LAB — PROTIME-INR
INR: 0.93 (ref 0.00–1.49)
Prothrombin Time: 12.6 seconds (ref 11.6–15.2)

## 2014-03-21 NOTE — Progress Notes (Signed)
Primary -  Judith Chafehris kaplan PA  -  Cardiologist - dr Tresa Endokelly Stress test 03/13/2014, nov 2015 ekg, cath 3 years ago - at dr. Tresa EndoKelly office

## 2014-03-26 ENCOUNTER — Telehealth: Payer: Self-pay | Admitting: Cardiovascular Disease

## 2014-03-26 NOTE — Telephone Encounter (Signed)
Returned call to Hinckleyarla with [facility], left message on personal voice mail to refax surgical clearance form today.Dr.Kelly in office today will have him sign and I will fax back.

## 2014-03-26 NOTE — Telephone Encounter (Signed)
Albin FellingCarla wanted to know if the pt was cleared to have her Rt SI infusion with Dr. Yevette Edwardsumonski on 12/30. She stated that she will be faxing over a surgical clearance sheet today. Please contact  Thanks

## 2014-03-26 NOTE — Telephone Encounter (Signed)
Dr.Kelly cleared patient for upcoming right sacroiliac joint fusion.Surgical clearance [facility] at fax # [phone].

## 2014-03-26 NOTE — Progress Notes (Addendum)
Anesthesia Chart Review:  Patient is a 73 year old female scheduled for right sided SI joint fusion on 03/28/14 by Dr. Yevette Edwardsumonski.  History includes former smoker (quit 01/29/14), HTN, HLD, CAD, left PE 08/02/10 (s/p retrievable IVC filter 08/05/10), SOB, asthma, anxiety, migraines, GERD, L3-S1 decompression with L5-S1 fusion 08/05/13.Marland Kitchen. PCP is listed as Mady GemmaKristen Kaplan, PA-C. Cardiologist is Dr. Nicki Guadalajarahomas Kelly, last visit 01/30/14 for routine follow-up.  He ordered a stress test due to upcoming surgery--which was normal. His last notes says he will plan to see her following the study, and if stable, preoperative clearance will be given.  01/30/14 EKG: NSR.   Nuclear stress test 03/13/14: Overall Impression: Normal stress nuclear study. LV Wall Motion: No gating secondary to ectopy (Normal EKG with PVCs).  Echo 10/01/09: Normal LV wall thickness. Normal LV systolic function, EF => 55%. Mild mitral annular calcification. Mild MR/TR. AV appears mildly sclerotic. Trace AR/PR.  03/21/14 CXR: Pleural-parenchymal scarring. No acute cardiopulmonary disease.  According to Dr. Landry DykeKelly's notes, "Ms. Lelon PerlaSaunders has established coronary artery disease in 2001 [place] showed a 60% ostial narrowing of her LAD. Repeat catheterization by me in 2009 did not show any significant interval change."  I don't have a copy of either report.  Preoperative labs noted.    She had a recent normal nuclear stress test, although non-gated.  Based on results, I would anticipate that she could proceed with surgery plans; however, I don't see formal cardiology clearance note in Epic.  Carla at Dr. Marshell Levanumonski's office to follow-up with Dr. Landry DykeKelly's office. (Update: Note in Epic from Cristela Feltherly Pugh, LPN indicates that Dr. Tresa EndoKelly cleared patient for upcoming right SI joint fusion.)  Velna Ochsllison Female Minish, PA-C [facility] Short Stay Center/Anesthesiology Phone [phone] 03/26/2014 11:10 AM

## 2014-03-27 MED ORDER — VANCOMYCIN HCL IN DEXTROSE 1-5 GM/200ML-% IV SOLN
1000.0000 mg | INTRAVENOUS | Status: AC
  Administered 2014-03-28: 1000 mg via INTRAVENOUS
  Filled 2014-03-27: qty 200

## 2014-03-27 MED ORDER — POVIDONE-IODINE 7.5 % EX SOLN
Freq: Once | CUTANEOUS | Status: DC
  Filled 2014-03-27: qty 118

## 2014-03-27 NOTE — Anesthesia Preprocedure Evaluation (Addendum)
Anesthesia Evaluation  Patient identified by MRN, date of birth, ID band Patient awake    Reviewed: Allergy & Precautions, H&P , NPO status , Patient's Chart, lab work & pertinent test results, reviewed documented beta blocker date and time   Airway Mallampati: II       Dental  (+) Teeth Intact   Pulmonary asthma (inhalers) , former smoker,          Cardiovascular hypertension, Pt. on medications Rhythm:Regular  NEG STRESS 02/2014, EF 65% 2013, cleared cardiology   Neuro/Psych Anxiety    GI/Hepatic GERD-  Medicated,  Endo/Other    Renal/GU      Musculoskeletal   Abdominal (+)  Abdomen: soft.    Peds  Hematology   Anesthesia Other Findings   Reproductive/Obstetrics                            Anesthesia Physical Anesthesia Plan  ASA: III  Anesthesia Plan: General   Post-op Pain Management:    Induction: Intravenous  Airway Management Planned: Oral ETT  Additional Equipment:   Intra-op Plan:   Post-operative Plan: Extubation in OR  Informed Consent: I have reviewed the patients History and Physical, chart, labs and discussed the procedure including the risks, benefits and alternatives for the proposed anesthesia with the patient or authorized representative who has indicated his/her understanding and acceptance.     Plan Discussed with:   Anesthesia Plan Comments:         Anesthesia Quick Evaluation

## 2014-03-28 ENCOUNTER — Ambulatory Visit (HOSPITAL_COMMUNITY): Payer: Medicare Other | Admitting: Vascular Surgery

## 2014-03-28 ENCOUNTER — Ambulatory Visit (HOSPITAL_COMMUNITY)
Admission: RE | Admit: 2014-03-28 | Discharge: 2014-03-28 | Disposition: A | Discharge status: Home / Self Care | Payer: Medicare Other | Source: Ambulatory Visit | Attending: Orthopedic Surgery | Admitting: Orthopedic Surgery

## 2014-03-28 ENCOUNTER — Encounter (HOSPITAL_COMMUNITY): Payer: Self-pay | Admitting: *Deleted

## 2014-03-28 ENCOUNTER — Encounter (HOSPITAL_COMMUNITY)
Admission: RE | Disposition: A | Discharge status: Home / Self Care | Payer: Self-pay | Source: Ambulatory Visit | Attending: Orthopedic Surgery

## 2014-03-28 ENCOUNTER — Ambulatory Visit (HOSPITAL_COMMUNITY): Payer: Medicare Other

## 2014-03-28 ENCOUNTER — Ambulatory Visit (HOSPITAL_COMMUNITY): Payer: Medicare Other | Admitting: Anesthesiology

## 2014-03-28 DIAGNOSIS — M9904 Segmental and somatic dysfunction of sacral region: Secondary | ICD-10-CM | POA: Diagnosis not present

## 2014-03-28 DIAGNOSIS — Z79891 Long term (current) use of opiate analgesic: Secondary | ICD-10-CM | POA: Insufficient documentation

## 2014-03-28 DIAGNOSIS — K219 Gastro-esophageal reflux disease without esophagitis: Secondary | ICD-10-CM | POA: Insufficient documentation

## 2014-03-28 DIAGNOSIS — Z87891 Personal history of nicotine dependence: Secondary | ICD-10-CM | POA: Diagnosis not present

## 2014-03-28 DIAGNOSIS — I10 Essential (primary) hypertension: Secondary | ICD-10-CM | POA: Diagnosis not present

## 2014-03-28 DIAGNOSIS — M1990 Unspecified osteoarthritis, unspecified site: Secondary | ICD-10-CM | POA: Insufficient documentation

## 2014-03-28 DIAGNOSIS — Z79899 Other long term (current) drug therapy: Secondary | ICD-10-CM | POA: Diagnosis not present

## 2014-03-28 DIAGNOSIS — Z86711 Personal history of pulmonary embolism: Secondary | ICD-10-CM | POA: Diagnosis not present

## 2014-03-28 DIAGNOSIS — Z7951 Long term (current) use of inhaled steroids: Secondary | ICD-10-CM | POA: Diagnosis not present

## 2014-03-28 DIAGNOSIS — J45909 Unspecified asthma, uncomplicated: Secondary | ICD-10-CM | POA: Diagnosis not present

## 2014-03-28 DIAGNOSIS — M545 Low back pain: Secondary | ICD-10-CM | POA: Diagnosis present

## 2014-03-28 DIAGNOSIS — F419 Anxiety disorder, unspecified: Secondary | ICD-10-CM | POA: Insufficient documentation

## 2014-03-28 DIAGNOSIS — I2510 Atherosclerotic heart disease of native coronary artery without angina pectoris: Secondary | ICD-10-CM | POA: Diagnosis not present

## 2014-03-28 DIAGNOSIS — M533 Sacrococcygeal disorders, not elsewhere classified: Secondary | ICD-10-CM | POA: Diagnosis not present

## 2014-03-28 DIAGNOSIS — E785 Hyperlipidemia, unspecified: Secondary | ICD-10-CM | POA: Diagnosis not present

## 2014-03-28 DIAGNOSIS — G43909 Migraine, unspecified, not intractable, without status migrainosus: Secondary | ICD-10-CM | POA: Diagnosis not present

## 2014-03-28 DIAGNOSIS — F22 Delusional disorders: Secondary | ICD-10-CM

## 2014-03-28 HISTORY — PX: SACROILIAC JOINT FUSION: SHX6088

## 2014-03-28 SURGERY — SACROILIAC JOINT FUSION
Anesthesia: General | Laterality: Right

## 2014-03-28 MED ORDER — BUPIVACAINE-EPINEPHRINE (PF) 0.25% -1:200000 IJ SOLN
INTRAMUSCULAR | Status: DC | PRN
  Administered 2014-03-28: 16 mL via PERINEURAL

## 2014-03-28 MED ORDER — ROCURONIUM BROMIDE 100 MG/10ML IV SOLN
INTRAVENOUS | Status: DC | PRN
  Administered 2014-03-28: 40 mg via INTRAVENOUS

## 2014-03-28 MED ORDER — NEOSTIGMINE METHYLSULFATE 10 MG/10ML IV SOLN
INTRAVENOUS | Status: AC
  Filled 2014-03-28: qty 1

## 2014-03-28 MED ORDER — PROPOFOL 10 MG/ML IV BOLUS: INTRAVENOUS | Status: DC | PRN

## 2014-03-28 MED ORDER — LIDOCAINE HCL (CARDIAC) 20 MG/ML IV SOLN
INTRAVENOUS | Status: DC | PRN
  Administered 2014-03-28: 100 mg via INTRAVENOUS

## 2014-03-28 MED ORDER — FENTANYL CITRATE 0.05 MG/ML IJ SOLN
INTRAMUSCULAR | Status: DC | PRN
  Administered 2014-03-28 (×2): 50 ug via INTRAVENOUS

## 2014-03-28 MED ORDER — PROPOFOL 10 MG/ML IV BOLUS
INTRAVENOUS | Status: AC
  Filled 2014-03-28: qty 20

## 2014-03-28 MED ORDER — LACTATED RINGERS IV SOLN
INTRAVENOUS | Status: DC | PRN
  Administered 2014-03-28 (×2): via INTRAVENOUS

## 2014-03-28 MED ORDER — LIDOCAINE HCL (CARDIAC) 20 MG/ML IV SOLN
INTRAVENOUS | Status: AC
  Filled 2014-03-28: qty 5

## 2014-03-28 MED ORDER — FENTANYL CITRATE 0.05 MG/ML IJ SOLN
25.0000 ug | INTRAMUSCULAR | Status: DC | PRN
  Administered 2014-03-28 (×2): 50 ug via INTRAVENOUS

## 2014-03-28 MED ORDER — MEPERIDINE HCL 25 MG/ML IJ SOLN: 6.2500 mg | INTRAMUSCULAR | Status: DC | PRN

## 2014-03-28 MED ORDER — FENTANYL CITRATE 0.05 MG/ML IJ SOLN
INTRAMUSCULAR | Status: AC
  Filled 2014-03-28: qty 5

## 2014-03-28 MED ORDER — GLYCOPYRROLATE 0.2 MG/ML IJ SOLN
INTRAMUSCULAR | Status: DC | PRN
  Administered 2014-03-28: .4 mg via INTRAVENOUS

## 2014-03-28 MED ORDER — BUPIVACAINE-EPINEPHRINE (PF) 0.25% -1:200000 IJ SOLN
INTRAMUSCULAR | Status: AC
  Filled 2014-03-28: qty 30

## 2014-03-28 MED ORDER — ONDANSETRON HCL 4 MG/2ML IJ SOLN
INTRAMUSCULAR | Status: AC
  Filled 2014-03-28: qty 2

## 2014-03-28 MED ORDER — LIDOCAINE HCL 4 % MT SOLN
OROMUCOSAL | Status: DC | PRN
  Administered 2014-03-28: 4 mL via TOPICAL

## 2014-03-28 MED ORDER — EPHEDRINE SULFATE 50 MG/ML IJ SOLN
INTRAMUSCULAR | Status: AC
  Filled 2014-03-28: qty 1

## 2014-03-28 MED ORDER — NEOSTIGMINE METHYLSULFATE 10 MG/10ML IV SOLN
INTRAVENOUS | Status: DC | PRN
  Administered 2014-03-28: 3 mg via INTRAVENOUS

## 2014-03-28 MED ORDER — SODIUM CHLORIDE 0.9 % IJ SOLN
INTRAMUSCULAR | Status: AC
  Filled 2014-03-28: qty 10

## 2014-03-28 MED ORDER — PROMETHAZINE HCL 25 MG/ML IJ SOLN
6.2500 mg | INTRAMUSCULAR | Status: DC | PRN
Start: 2014-03-28 — End: 2014-03-28
  Administered 2014-03-28: 6.25 mg via INTRAVENOUS

## 2014-03-28 MED ORDER — PROMETHAZINE HCL 25 MG/ML IJ SOLN
INTRAMUSCULAR | Status: AC
  Filled 2014-03-28: qty 1

## 2014-03-28 MED ORDER — ROCURONIUM BROMIDE 50 MG/5ML IV SOLN
INTRAVENOUS | Status: AC
Start: 2014-03-28 — End: 2014-03-28
  Filled 2014-03-28: qty 1

## 2014-03-28 MED ORDER — GLYCOPYRROLATE 0.2 MG/ML IJ SOLN
INTRAMUSCULAR | Status: AC
  Filled 2014-03-28: qty 2

## 2014-03-28 MED ORDER — ONDANSETRON HCL 4 MG/2ML IJ SOLN
INTRAMUSCULAR | Status: DC | PRN
  Administered 2014-03-28: 4 mg via INTRAVENOUS

## 2014-03-28 MED ORDER — 0.9 % SODIUM CHLORIDE (POUR BTL) OPTIME
TOPICAL | Status: DC | PRN
  Administered 2014-03-28: 1000 mL

## 2014-03-28 MED ORDER — PROPOFOL 10 MG/ML IV BOLUS
INTRAVENOUS | Status: DC | PRN
  Administered 2014-03-28: 140 mg via INTRAVENOUS

## 2014-03-28 MED ORDER — FENTANYL CITRATE 0.05 MG/ML IJ SOLN
INTRAMUSCULAR | Status: AC
  Filled 2014-03-28: qty 2

## 2014-03-28 MED ORDER — SUCCINYLCHOLINE CHLORIDE 20 MG/ML IJ SOLN
INTRAMUSCULAR | Status: AC
  Filled 2014-03-28: qty 1

## 2014-03-28 SURGICAL SUPPLY — 52 items
BENZOIN TINCTURE PRP APPL 2/3 (GAUZE/BANDAGES/DRESSINGS) ×2 IMPLANT
BLADE SURG 10 STRL SS (BLADE) ×2 IMPLANT
BLADE SURG 11 STRL SS (BLADE) ×2 IMPLANT
BLADE SURG ROTATE 9660 (MISCELLANEOUS) ×2 IMPLANT
CANISTER SUCTION 2500CC (MISCELLANEOUS) ×2 IMPLANT
CAP-I-FUSE IMPLANT SYSTEM ×2 IMPLANT
COVER SURGICAL LIGHT HANDLE (MISCELLANEOUS) ×4 IMPLANT
DRAPE C-ARM 42X72 X-RAY (DRAPES) ×2 IMPLANT
DRAPE C-ARMOR (DRAPES) ×2 IMPLANT
DRAPE INCISE IOBAN 66X45 STRL (DRAPES) ×2 IMPLANT
DRAPE POUCH INSTRU U-SHP 10X18 (DRAPES) ×2 IMPLANT
DRAPE SURG 17X23 STRL (DRAPES) ×6 IMPLANT
DURAPREP 26ML APPLICATOR (WOUND CARE) ×2 IMPLANT
ELECT CAUTERY BLADE 6.4 (BLADE) ×2 IMPLANT
ELECT REM PT RETURN 9FT ADLT (ELECTROSURGICAL) ×2
ELECTRODE REM PT RTRN 9FT ADLT (ELECTROSURGICAL) ×1 IMPLANT
GAUZE SPONGE 4X4 12PLY STRL (GAUZE/BANDAGES/DRESSINGS) ×2 IMPLANT
GAUZE SPONGE 4X4 16PLY XRAY LF (GAUZE/BANDAGES/DRESSINGS) ×2 IMPLANT
GLOVE BIO SURGEON STRL SZ7 (GLOVE) ×2 IMPLANT
GLOVE BIO SURGEON STRL SZ8 (GLOVE) ×2 IMPLANT
GLOVE BIOGEL PI IND STRL 7.0 (GLOVE) ×1 IMPLANT
GLOVE BIOGEL PI IND STRL 8 (GLOVE) ×1 IMPLANT
GLOVE BIOGEL PI INDICATOR 7.0 (GLOVE) ×1
GLOVE BIOGEL PI INDICATOR 8 (GLOVE) ×1
GOWN STRL REUS W/ TWL LRG LVL3 (GOWN DISPOSABLE) ×2 IMPLANT
GOWN STRL REUS W/ TWL XL LVL3 (GOWN DISPOSABLE) ×1 IMPLANT
GOWN STRL REUS W/TWL LRG LVL3 (GOWN DISPOSABLE) ×2
GOWN STRL REUS W/TWL XL LVL3 (GOWN DISPOSABLE) ×1
KIT BASIN OR (CUSTOM PROCEDURE TRAY) ×2 IMPLANT
KIT ROOM TURNOVER OR (KITS) ×2 IMPLANT
MANIFOLD NEPTUNE II (INSTRUMENTS) ×2 IMPLANT
NEEDLE 22X1 1/2 (OR ONLY) (NEEDLE) ×2 IMPLANT
NEEDLE HYPO 25GX1X1/2 BEV (NEEDLE) ×2 IMPLANT
NS IRRIG 1000ML POUR BTL (IV SOLUTION) ×2 IMPLANT
PACK UNIVERSAL I (CUSTOM PROCEDURE TRAY) ×2 IMPLANT
PAD ARMBOARD 7.5X6 YLW CONV (MISCELLANEOUS) ×4 IMPLANT
PENCIL BUTTON HOLSTER BLD 10FT (ELECTRODE) ×2 IMPLANT
SPONGE GAUZE 4X4 12PLY STER LF (GAUZE/BANDAGES/DRESSINGS) ×2 IMPLANT
SPONGE LAP 18X18 X RAY DECT (DISPOSABLE) ×2 IMPLANT
STAPLER VISISTAT 35W (STAPLE) ×2 IMPLANT
STRIP CLOSURE SKIN 1/2X4 (GAUZE/BANDAGES/DRESSINGS) ×2 IMPLANT
SUT MNCRL AB 4-0 PS2 18 (SUTURE) ×2 IMPLANT
SUT VIC AB 0 CT1 18XCR BRD 8 (SUTURE) ×1 IMPLANT
SUT VIC AB 0 CT1 8-18 (SUTURE) ×1
SUT VIC AB 2-0 CT2 18 VCP726D (SUTURE) ×2 IMPLANT
SYR BULB IRRIGATION 50ML (SYRINGE) ×2 IMPLANT
SYR CONTROL 10ML LL (SYRINGE) ×2 IMPLANT
TOWEL OR 17X24 6PK STRL BLUE (TOWEL DISPOSABLE) ×2 IMPLANT
TOWEL OR 17X26 10 PK STRL BLUE (TOWEL DISPOSABLE) ×4 IMPLANT
TUBE CONNECTING 12X1/4 (SUCTIONS) ×2 IMPLANT
WATER STERILE IRR 1000ML POUR (IV SOLUTION) ×2 IMPLANT
YANKAUER SUCT BULB TIP NO VENT (SUCTIONS) ×2 IMPLANT

## 2014-03-28 NOTE — H&P (Signed)
PREOPERATIVE H&P  Chief Complaint: Right low back pain  HPI: Judith Stevens is a 73 y.o. female who presents with ongoing pain in the right low back  Exam c/w R SI dysfunction. Patient gained temp relief with a right SI injection  Patient has failed multiple forms of conservative care and continues to have pain (see office notes for additional details regarding the patient's full course of treatment)  Past Medical History  Diagnosis Date  . Hypertension   . Hyperlipidemia   . CAD (coronary artery disease)     single vessel  . Tobacco abuse   . Pulmonary emboli 5/12  . Hx of echocardiogram 09/2009    EF >55% Normal Echo  . History of stress test 10/2011    Resting images reveal a normal pattern of perfusion in all regions. The post stress myocardial perfusion images show a normal pattern of perfusion in all region. The post stress left ventricle is normal size. the rest left ventricle is normal size, there is no scintigraphic evidence of inductible myocardial ischemia.  . Shortness of breath dyspnea     exertion or eating something shes allergic too  . Asthma   . Pneumonia     hx of  . Anxiety   . GERD (gastroesophageal reflux disease)   . Headache   . Migraines   . Arthritis    Past Surgical History  Procedure Laterality Date  . Knee surgery  01/17/2007    right  . Foot surgery  2002,2013    right  . Cardiac catheterization  10/13/2007    60% narrowing twin LAD system  . Back surgery  2012   History   Social History  . Marital Status: Widowed    Spouse Name: N/A    Number of Children: N/A  . Years of Education: N/A   Social History Main Topics  . Smoking status: Former Smoker -- 0.00 packs/day    Types: Cigarettes    Start date: 01/30/2014  . Smokeless tobacco: Former NeurosurgeonUser    Quit date: 01/29/2014  . Alcohol Use: 2.4 oz/week    0 Not specified, 2 Cans of beer, 2 Glasses of wine per week  . Drug Use: No  . Sexual Activity: None   Other  Topics Concern  . None   Social History Narrative   Family History  Problem Relation Age of Onset  . Heart attack Mother   . Diabetes Mother   . Heart attack Father   . Diabetes Sister   . Hyperlipidemia Sister    Allergies  Allergen Reactions  . Cefzil [Cefprozil] Shortness Of Breath  . Dairy Aid [Lactase] Shortness Of Breath  . Keflex [Cephalexin] Shortness Of Breath  . Levaquin [Levofloxacin] Shortness Of Breath  . Naproxen Shortness Of Breath    Shortness of breath  . Peanuts [Peanut Oil] Shortness Of Breath  . Penicillins Shortness Of Breath  . Percocet [Oxycodone-Acetaminophen] Shortness Of Breath  . Red Dye Shortness Of Breath  . Sulfa Antibiotics Shortness Of Breath  . Tetracyclines & Related Shortness Of Breath   Prior to Admission medications   Medication Sig Start Date End Date Taking? Authorizing Provider  albuterol (PROVENTIL HFA;VENTOLIN HFA) 108 (90 BASE) MCG/ACT inhaler Inhale 2 puffs into the lungs every 6 (six) hours as needed for wheezing.   Yes Historical Provider, MD  ALPRAZolam Prudy Feeler(XANAX) 0.25 MG tablet Take 0.25 mg by mouth at bedtime as needed for sleep.   Yes Historical Provider, MD  atorvastatin (  LIPITOR) 80 MG tablet Take 1 tablet (80 mg total) by mouth daily. 01/30/14  Yes Lennette Biharihomas A Kelly, MD  butalbital-acetaminophen-caffeine (FIORICET WITH CODEINE) [phone] MG per capsule Take 1 capsule by mouth daily as needed for headache.  01/16/14  Yes Historical Provider, MD  Cholecalciferol (VITAMIN D3) 2000 UNITS TABS Take 2,000 Units by mouth daily.    Yes Historical Provider, MD  diltiazem (CARDIZEM CD) 240 MG 24 hr capsule Take 1 capsule (240 mg total) by mouth daily. Patient taking differently: Take 240 mg by mouth at bedtime.  01/30/14  Yes Lennette Biharihomas A Kelly, MD  escitalopram (LEXAPRO) 20 MG tablet Take 1 tablet (20 mg total) by mouth daily. 04/13/13  Yes Lennette Biharihomas A Kelly, MD  ezetimibe (ZETIA) 10 MG tablet Take 1 tablet (10 mg total) by mouth daily. 01/30/14   Yes Lennette Biharihomas A Kelly, MD  Multiple Vitamin (MULTI-VITAMIN DAILY PO) Take 1 tablet by mouth daily.   Yes Historical Provider, MD  nitroGLYCERIN (NITROSTAT) 0.4 MG SL tablet Place 1 tablet (0.4 mg total) under the tongue every 5 (five) minutes as needed for chest pain. 01/13/13  Yes Lennette Biharihomas A Kelly, MD  ondansetron (ZOFRAN) 4 MG tablet Take 4 mg by mouth every 8 (eight) hours as needed for nausea.   Yes Historical Provider, MD  pantoprazole (PROTONIX) 40 MG tablet Take 80 mg by mouth 2 (two) times daily.    Yes Historical Provider, MD  terbutaline (BRETHINE) 5 MG tablet Take 5 mg by mouth every 6 (six) hours as needed (asthma).    Yes Historical Provider, MD  fluticasone (VERAMYST) 27.5 MCG/SPRAY nasal spray Place 2 sprays into the nose daily as needed for rhinitis.    Historical Provider, MD  fluticasone-salmeterol (ADVAIR HFA) 115-21 MCG/ACT inhaler Inhale 2 puffs into the lungs 2 (two) times daily.    Historical Provider, MD     All other systems have been reviewed and were otherwise negative with the exception of those mentioned in the HPI and as above.  Physical Exam: There were no vitals filed for this visit.  General: Alert, no acute distress Cardiovascular: No pedal edema Respiratory: No cyanosis, no use of accessory musculature Skin: No lesions in the area of chief complaint Neurologic: Sensation intact distally Psychiatric: Patient is competent for consent with normal mood and affect Lymphatic: No axillary or cervical lymphadenopathy  MUSCULOSKELETAL: + TTP right low back  Assessment/Plan: Right sided sacroliiac joint dysfuction Plan for Procedure(s): SACROILIAC JOINT FUSION   Emilee HeroUMONSKI,Judith Smelcer LEONARD, MD 03/28/2014 7:56 AM

## 2014-03-28 NOTE — Discharge Instructions (Signed)

## 2014-03-28 NOTE — Transfer of Care (Signed)
Immediate Anesthesia Transfer of Care Note  Patient: Judith Stevens  Procedure(s) Performed: Procedure(s) with comments: SACROILIAC JOINT FUSION (Right) - Right sided sacroliac joint fusion  Patient Location: PACU  Anesthesia Type:General  Level of Consciousness: awake, oriented, sedated and patient cooperative  Airway & Oxygen Therapy: Patient Spontanous Breathing and Patient connected to face mask oxygen  Post-op Assessment: Report given to PACU RN, Post -op Vital signs reviewed and stable and Patient moving all extremities  Post vital signs: Reviewed and stable  Complications: No apparent anesthesia complications

## 2014-03-28 NOTE — Progress Notes (Signed)
Orthopedic Tech Progress Note Patient Details:  Colan NeptuneJacqueline M Stofko 03/31/1940 [id number]  Ortho Devices Type of Ortho Device: Crutches Ortho Device/Splint Interventions: Application   Jeramine Delis 03/28/2014, 11:59 AM

## 2014-03-28 NOTE — Addendum Note (Signed)
Addendum  created 03/28/14 1920 by Coralee Rudobert Precious Segall, CRNA   Modules edited: Anesthesia Events, Narrator   Narrator:  Narrator: Event Log Edited

## 2014-03-28 NOTE — Anesthesia Procedure Notes (Signed)
Procedure Name: Intubation Date/Time: 03/28/2014 8:35 AM Performed by: Izora Gala Pre-anesthesia Checklist: Patient identified, Emergency Drugs available, Suction available, Patient being monitored and Timeout performed Patient Re-evaluated:Patient Re-evaluated prior to inductionOxygen Delivery Method: Circle system utilized Preoxygenation: Pre-oxygenation with 100% oxygen Intubation Type: IV induction Ventilation: Mask ventilation without difficulty Laryngoscope Size: Miller and 3 Grade View: Grade II Tube type: Oral Tube size: 7.0 mm Number of attempts: 1 (Significant overbite,  unable to bite upper lip) Airway Equipment and Method: Stylet and LTA kit utilized Placement Confirmation: ETT inserted through vocal cords under direct vision,  positive ETCO2 and breath sounds checked- equal and bilateral Secured at: 22 cm Tube secured with: Tape Dental Injury: Teeth and Oropharynx as per pre-operative assessment

## 2014-03-28 NOTE — Anesthesia Postprocedure Evaluation (Signed)
  Anesthesia Post-op Note  Patient: Judith Stevens  Procedure(s) Performed: Procedure(s) with comments: SACROILIAC JOINT FUSION (Right) - Right sided sacroliac joint fusion  Patient Location: PACU  Anesthesia Type:General  Level of Consciousness: awake and alert   Airway and Oxygen Therapy: Patient Spontanous Breathing and Patient connected to nasal cannula oxygen  Post-op Pain: mild  Post-op Assessment: Post-op Vital signs reviewed, Patient's Cardiovascular Status Stable, Respiratory Function Stable and Patent Airway  Post-op Vital Signs: Reviewed  Last Vitals:  Filed Vitals:   03/28/14 1015  BP:   Pulse: 56  Temp:   Resp: 15    Complications: No apparent anesthesia complications

## 2014-03-28 NOTE — Progress Notes (Signed)
Orthopedic Tech Progress Note Patient Details:  Colan NeptuneJacqueline M Miltenberger 07/25/1940 [id number]  Patient ID: Colan NeptuneJacqueline M Gammell, female   DOB: 07/04/1940, 73 y.o.   MRN: [id number] Viewed order from doctor's order list  Nikki DomCrawford, Norrin Shreffler 03/28/2014, 11:59 AM

## 2014-03-28 NOTE — Op Note (Signed)
NAME:  Judith Stevens, Judith Stevens         ACCOUNT NO.:  [id number]  MEDICAL RECORD NO.:  [id number]  LOCATION:  [facility]                         FACILITY:  [facility]  PHYSICIAN:  Estill BambergMark Shantaya Bluestone, MD      DATE OF BIRTH:  July 19, 1940  DATE OF PROCEDURE:  03/28/2014 DATE OF DISCHARGE:                              OPERATIVE REPORT   PREOPERATIVE DIAGNOSIS:  Right-sided sacroiliac joint dysfunction.  POSTOPERATIVE DIAGNOSIS:  Right-sided sacroiliac joint dysfunction.  PROCEDURE:  Right-sided sacroiliac joint fusion using the iFuse sacroiliac joint fusion system.  SURGEON:  Estill BambergMark Keyen Marban, MD.  ASSISTANJason Coop:  Kayla McKenzie, PA-C.  ANESTHESIA:  General endotracheal anesthesia.  COMPLICATIONS:  None.  DISPOSITION:  Stable.  ESTIMATED BLOOD LOSS:  Minimal.  INDICATIONS FOR SURGERY:  Briefly, Judith Stevens is a very pleasant 73- year-old female, who is status post an L5-S1 fusion by me.  She did very well from that surgery, but did go on to have pain in the right side of her low back in the region of her sacroiliac joint.  Her exam was consistent with sacroiliac joint dysfunction.  We did discuss treatment options.  We did go forward with an extensive conservative care, but she did continue to have pain.  We therefore did discuss proceeding with the procedure noted above.  The patient was fully aware of the risks and limitations of the procedure and did elect to proceed.  Of particular note, she was fully aware of the risks associated with a nonunion as well as lack of resolution of pain.  OPERATIVE DETAILS:  On March 28, 2014, the patient brought to surgery and general endotracheal anesthesia was administered.  The patient was placed prone on a well-padded flat Jackson bed.  Drills were placed under the patient's chest and hips.  Antibiotics were given and a time- out procedure was performed.  A 3 cm incision was made overlying the sacroiliac joint on the right side.  I then advanced 3 guidewires  across the sacroiliac joint.  The first one was placed in line with the S1 foramen, the second one was just beneath it, and the third one was in line with the S2 foramen.  I did liberally use lateral __________ radiographs while admitting the guidewires.  I then drilled and broached over the guidewires and the appropriate size sacroiliac joint fusion implants were advanced over the guidewires.  I did ensure that the implants were advanced up to the lateral aspect of the foramina.  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  The guidewires were then removed.  I was very pleased with the final appearance of the implants on lateral fluoroscopic images.  The wound was copiously irrigated.  The fascia was then closed using #1 Vicryl.  The subcutaneous layer was closed using 2-0 Vicryl followed by 3-0 Monocryl. Benzoin and Steri-Strips were applied followed by sterile dressing.  All instrument counts were correct at the termination of the procedure.  Of note, Jason CoopKayla McKenzie was my assistant throughout the surgery, and did aid in retraction, suctioning, and closure.     Estill BambergMark Lyne Khurana, MD     MD/MEDQ  D:  03/28/2014  T:  03/28/2014  Job:  [id number]

## 2014-04-02 ENCOUNTER — Encounter (HOSPITAL_COMMUNITY): Payer: Self-pay | Admitting: Orthopedic Surgery

## 2014-04-03 ENCOUNTER — Encounter (HOSPITAL_COMMUNITY): Payer: Medicare Other

## 2014-04-11 ENCOUNTER — Ambulatory Visit: Payer: Medicare Other | Admitting: Cardiovascular Disease

## 2014-12-13 ENCOUNTER — Other Ambulatory Visit: Payer: Self-pay | Admitting: Cardiovascular Disease

## 2015-02-11 ENCOUNTER — Ambulatory Visit (INDEPENDENT_AMBULATORY_CARE_PROVIDER_SITE_OTHER): Payer: Medicare Other | Admitting: Cardiovascular Disease

## 2015-02-11 VITALS — BP 132/70 | HR 60 | Ht 64.0 in | Wt 147.0 lb

## 2015-02-11 DIAGNOSIS — R0602 Shortness of breath: Secondary | ICD-10-CM | POA: Diagnosis not present

## 2015-02-11 DIAGNOSIS — Z79899 Other long term (current) drug therapy: Secondary | ICD-10-CM | POA: Diagnosis not present

## 2015-02-11 DIAGNOSIS — E785 Hyperlipidemia, unspecified: Secondary | ICD-10-CM | POA: Diagnosis not present

## 2015-02-11 DIAGNOSIS — Z87891 Personal history of nicotine dependence: Secondary | ICD-10-CM | POA: Diagnosis not present

## 2015-02-11 DIAGNOSIS — I2583 Coronary atherosclerosis due to lipid rich plaque: Secondary | ICD-10-CM

## 2015-02-11 DIAGNOSIS — I2510 Atherosclerotic heart disease of native coronary artery without angina pectoris: Secondary | ICD-10-CM

## 2015-02-11 DIAGNOSIS — Z720 Tobacco use: Secondary | ICD-10-CM

## 2015-02-11 DIAGNOSIS — R5383 Other fatigue: Secondary | ICD-10-CM

## 2015-02-11 DIAGNOSIS — R0600 Dyspnea, unspecified: Secondary | ICD-10-CM

## 2015-02-11 DIAGNOSIS — I10 Essential (primary) hypertension: Secondary | ICD-10-CM

## 2015-02-11 MED ORDER — ATORVASTATIN CALCIUM 80 MG PO TABS: 80.0000 mg | ORAL_TABLET | Freq: Every day | ORAL | Status: DC

## 2015-02-11 MED ORDER — DILTIAZEM HCL ER COATED BEADS 240 MG PO CP24: 240.0000 mg | ORAL_CAPSULE | Freq: Every day | ORAL | Status: DC

## 2015-02-11 MED ORDER — EZETIMIBE 10 MG PO TABS: 10.0000 mg | ORAL_TABLET | Freq: Every day | ORAL | Status: DC

## 2015-02-11 NOTE — Patient Instructions (Signed)
Medication Instructions:  Please continue your current medications.  Labwork: Your physician recommends that you return for lab work at your earliest convenience - FASTING.  Testing/Procedures: Your physician has requested that you have an echocardiogram. Echocardiography is a painless test that uses sound waves to create images of your heart. It provides your doctor with information about the size and shape of your heart and how well your heart's chambers and valves are working. This procedure takes approximately one hour. There are no restrictions for this procedure.  Follow-Up: Dr Tresa EndoKelly recommends that you schedule a follow-up appointment in February 2017.  If you need a refill on your cardiac medications before your next appointment, please call your pharmacy.

## 2015-02-12 ENCOUNTER — Encounter: Payer: Self-pay | Admitting: Cardiovascular Disease

## 2015-02-12 DIAGNOSIS — R0600 Dyspnea, unspecified: Secondary | ICD-10-CM | POA: Insufficient documentation

## 2015-02-12 NOTE — Progress Notes (Signed)
Patient ID: Judith Stevens, female   DOB: 25-Oct-1940, 74 y.o.   MRN: [id number]     HPI: Judith Stevens is a 74 y.o. female who presents for a one-year cardiology evaluation.  Judith Stevens has CAD and in 2001 underwent cardiac catheterization while in [place] which showed a 60% ostial narrowing of her LAD. Repeat catheterization by me in 2009 did not show any significant interval change. Unfortunately, she has continued her ongoing tobacco habit but is now smoking significantly reduced at less than one quarter pack per day.  She has a history of hypertension as well as hyperlipidemia.  A nuclear perfusion study in August 2013 which was unchanged from 2 years previously and continued to show normal perfusion.   Over the past year, she denies recurrent chest pain.  In December 2015 .  Prior to undergoing right hip surgery.  She underwent went a 2 year follow-up nuclear perfusion study on 03/13/2014.  This continues to show normal perfusion without scar or ischemia.  She tolerated her hip surgery well.  She had smoked for 50 years before finally quitting smoking when I had seen her last.  For the past 6 weeks she has noticed some increasing shortness of breath, not typically exertionally precipitated.  She admits to fatigue She admits to significant milk allergy.  She is unaware of palpitations.  She has hyperlipidemia and has been taking atorvastatin 80 mg and Zetia 10 mg in attempt to continue to induce plaque regression.  She presents for evaluation.   Past Medical History  Diagnosis Date  . Hypertension   . Hyperlipidemia   . CAD (coronary artery disease)     single vessel  . Tobacco abuse   . Pulmonary emboli ([place]) 5/12  . Hx of echocardiogram 09/2009    EF >55% Normal Echo  . History of stress test 10/2011    Resting images reveal a normal pattern of perfusion in all regions. The post stress myocardial perfusion images show a normal pattern of perfusion in all region. The post  stress left ventricle is normal size. the rest left ventricle is normal size, there is no scintigraphic evidence of inductible myocardial ischemia.  . Shortness of breath dyspnea     exertion or eating something shes allergic too  . Asthma   . Pneumonia     hx of  . Anxiety   . GERD (gastroesophageal reflux disease)   . Headache   . Migraines   . Arthritis     Past Surgical History  Procedure Laterality Date  . Knee surgery  01/17/2007    right  . Foot surgery  2002,2013    right  . Cardiac catheterization  10/13/2007    60% narrowing twin LAD system  . Back surgery  2012  . Sacroiliac joint fusion Right 03/28/2014    Procedure: SACROILIAC JOINT FUSION;  Surgeon: Sinclair Ship, MD;  Location: [facility];  Service: Orthopedics;  Laterality: Right;  Right sided sacroliac joint fusion    Allergies  Allergen Reactions  . Cefzil [Cefprozil] Shortness Of Breath  . Dairy Aid [Lactase] Shortness Of Breath  . Keflex [Cephalexin] Shortness Of Breath  . Levaquin [Levofloxacin] Shortness Of Breath  . Naproxen Shortness Of Breath    Shortness of breath  . Peanuts [Peanut Oil] Shortness Of Breath  . Penicillins Shortness Of Breath  . Percocet [Oxycodone-Acetaminophen] Shortness Of Breath  . Red Dye Shortness Of Breath  . Sulfa Antibiotics Shortness Of Breath  . Tetracyclines & Related  Shortness Of Breath    Current Outpatient Prescriptions  Medication Sig Dispense Refill  . albuterol (PROVENTIL HFA;VENTOLIN HFA) 108 (90 BASE) MCG/ACT inhaler Inhale 2 puffs into the lungs every 6 (six) hours as needed for wheezing.    Marland Kitchen ALPRAZolam (XANAX) 0.5 MG tablet Take 0.5 mg by mouth at bedtime as needed for anxiety.    Marland Kitchen atorvastatin (LIPITOR) 80 MG tablet Take 1 tablet (80 mg total) by mouth daily. 90 tablet 3  . butalbital-acetaminophen-caffeine (FIORICET WITH CODEINE) 50-325-40-30 MG per capsule Take 1 capsule by mouth daily as needed for headache.   5  . Cholecalciferol (VITAMIN D3) 2000  UNITS TABS Take 2,000 Units by mouth daily.     Marland Kitchen diltiazem (CARDIZEM CD) 240 MG 24 hr capsule Take 1 capsule (240 mg total) by mouth at bedtime. 90 capsule 3  . escitalopram (LEXAPRO) 20 MG tablet Take 1 tablet (20 mg total) by mouth daily. 90 tablet 0  . ezetimibe (ZETIA) 10 MG tablet Take 1 tablet (10 mg total) by mouth daily. 90 tablet 3  . fluticasone (VERAMYST) 27.5 MCG/SPRAY nasal spray Place 2 sprays into the nose daily as needed for rhinitis.    . fluticasone-salmeterol (ADVAIR HFA) 115-21 MCG/ACT inhaler Inhale 2 puffs into the lungs 2 (two) times daily.    . Multiple Vitamin (MULTI-VITAMIN DAILY PO) Take 1 tablet by mouth daily.    . nitroGLYCERIN (NITROSTAT) 0.4 MG SL tablet Place 1 tablet (0.4 mg total) under the tongue every 5 (five) minutes as needed for chest pain. Needs appointment for future refills 25 tablet 0  . ondansetron (ZOFRAN) 4 MG tablet Take 4 mg by mouth every 8 (eight) hours as needed for nausea.    . pantoprazole (PROTONIX) 40 MG tablet Take 80 mg by mouth 2 (two) times daily.     . terbutaline (BRETHINE) 5 MG tablet Take 5 mg by mouth every 6 (six) hours as needed (asthma).      No current facility-administered medications for this visit.    Social History   Social History  . Marital Status: Widowed    Spouse Name: N/A  . Number of Children: N/A  . Years of Education: N/A   Occupational History  . Not on file.   Social History Main Topics  . Smoking status: Former Smoker -- 0.00 packs/day    Types: Cigarettes    Start date: 01/30/2014  . Smokeless tobacco: Former Systems developer    Quit date: 01/29/2014  . Alcohol Use: 2.4 oz/week    0 Standard drinks or equivalent, 2 Cans of beer, 2 Glasses of wine per week  . Drug Use: No  . Sexual Activity: Not on file   Other Topics Concern  . Not on file   Social History Narrative    Family History  Problem Relation Age of Onset  . Heart attack Mother   . Diabetes Mother   . Heart attack Father   . Diabetes  Sister   . Hyperlipidemia Sister    Socially, she is widowed. She has 2 children 5 grandchildren. She does not exercise. She currently smokes one quarter pack per day. There is occasional alcohol use.  ROS General: Negative; No fevers, chills, or night sweats;  HEENT: Negative; No changes in vision or hearing, sinus congestion, difficulty swallowing Pulmonary: Negative; No cough, wheezing, shortness of breath, hemoptysis Cardiovascular: Negative; No chest pain, presyncope, syncope, palpitations GI: positive for GERD; No nausea, vomiting, diarrhea, or abdominal pain GU: Negative; No dysuria, hematuria, or difficulty  voiding Musculoskeletal: She tolerated her hip surgery well. Hematologic/Oncology: Negative; no easy bruising, bleeding Endocrine: Negative; no heat/cold intolerance; no diabetes Neuro: Negative; no changes in balance, headaches Skin: Negative; No rashes or skin lesions Psychiatric: Negative; No behavioral problems, depression Sleep: Negative; No snoring, daytime sleepiness, hypersomnolence, bruxism, restless legs, hypnogognic hallucinations, no cataplexy Other comprehensive 14 point system review is negative.  Nutritionally she cannot have dairy products due to significant milk allergy.  PE BP 132/70 mmHg  Pulse 60  Ht '5\' 4"'  (1.626 m)  Wt 147 lb (66.679 kg)  BMI 25.22 kg/m2   Wt Readings from Last 3 Encounters:  02/11/15 147 lb (66.679 kg)  03/28/14 145 lb (65.772 kg)  03/21/14 145 lb 9.6 oz (66.044 kg)   General: Alert, oriented, no distress.  Skin: normal turgor, no rashes HEENT: Normocephalic, atraumatic. Pupils round and reactive; sclera anicteric;no lid lag.  Nose without nasal septal hypertrophy Mouth/Parynx benign; Mallinpatti scale 3 Neck: No JVD, no carotid bruitswith normal carotid upstroke Chest wall: Nontender to palpationLungs: clear to ausculatation and percussion; no wheezing or rales Heart: RRR, s1 s2 normal 1/6 sem; .Marland Kitchen  No diastolic murmur.  No  S3 or S4 gallop.  No rubs thrills or heaves. Abdomen: soft, nontender; no hepatosplenomehaly, BS+; abdominal aorta nontender and not dilated by palpation. Back: No CVA tenderness Pulses 2+ Extremities: no clubbing cyanosis or edema, Homan's sign negative  Neurologic: grossly nonfocal Psychologic: normal affect and mood.  ECG (independently read by me): Normal sinus rhythm at 60 bpm.  No significant ST-T changes.  Normal intervals.  November 2015 ECG (independently read by me): Normal sinus rhythm at 72 bpm.  Normal intervals.  No significant ST-T changes.  Prior October 2014 ECG: Sinus rhythm with occasional isolated PVC. Heart rate 68 beats per minute. QTc interval 440 ms.  LAB: BMP Latest Ref Rng 03/21/2014 01/25/2014 01/11/2013  Glucose 70 - 99 mg/dL 96 96 99  BUN 6 - 23 mg/dL '15 14 11  ' Creatinine 0.50 - 1.10 mg/dL 0.56 0.50 0.47(L)  Sodium 135 - 145 mmol/L 142 141 142  Potassium 3.5 - 5.1 mmol/L 4.2 5.1 5.0  Chloride 96 - 112 mEq/L 107 106 105  CO2 19 - 32 mmol/L '31 27 30  ' Calcium 8.4 - 10.5 mg/dL 9.1 9.0 9.3   Hepatic Function Latest Ref Rng 03/21/2014 01/25/2014 01/11/2013  Total Protein 6.0 - 8.3 g/dL 5.9(L) 6.4 6.1  Albumin 3.5 - 5.2 g/dL 3.9 4.3 4.1  AST 0 - 37 U/L '18 15 16  ' ALT 0 - 35 U/L '19 14 19  ' Alk Phosphatase 39 - 117 U/L 101 98 91  Total Bilirubin 0.3 - 1.2 mg/dL 0.3 0.3 0.3   CBC Latest Ref Rng 03/21/2014 01/25/2014 01/11/2013  WBC 4.0 - 10.5 K/uL 5.3 5.6 5.9  Hemoglobin 12.0 - 15.0 g/dL 11.7(L) 12.3 12.3  Hematocrit 36.0 - 46.0 % 35.5(L) 36.6 38.0  Platelets 150 - 400 K/uL 220 222 243   Lab Results  Component Value Date   MCV 92.4 03/21/2014   MCV 90.1 01/25/2014   MCV 91.6 01/11/2013   Lab Results  Component Value Date   TSH 0.993 01/25/2014   No results found for: HGBA1C   Lipid Panel     Component Value Date/Time   CHOL 156 01/25/2014 0908   TRIG 40 01/25/2014 0908   HDL 75 01/25/2014 0908   CHOLHDL 2.1 01/25/2014 0908   VLDL 8 01/25/2014  0908   LDLCALC 73 01/25/2014 0908    RADIOLOGY: No  results found.    ASSESSMENT AND PLAN: Ms. Coppinger is a 74 year old white female who had smoked for over 50 years, but fortunately quit smoking one year ago.  She has been documented to have CAD with a 60% ostial LAD stenosis dating back to 2001 and which again was noted on her repeat catheterization by me in 2009.  We have been aggressively treating her lipids in attempt to induce plaque regression and she is tolerated Lipitor 80 mg in addition to Zetia 10 mg.  Her blood pressure today is controlled on Cardizem CD 240 mg.  She has GERD but this is controlled with Protonix.  The past 6 weeks she has noticed more shortness of breath than previously.  Oftentimes this is occurring when she wakes up.  She has not had recent blood work and I am checking a chemistry profile, CBC, TSH, lipid panel, and with her fatigability.  We'll also check a B12 and vitamin D level.  Because of her long-standing tobacco history,  I am also scheduling her for a PA and lateral chest x-ray.  She will undergo an echo Doppler study to assess both systolic and diastolic function as well as valvular architecture.  I will contact her regarding the results .  I will see her in 3 months for reevaluation.  Time spent: 25 minutes  Troy Sine, MD, [facility]  02/12/2015 4:02 PM

## 2015-02-18 ENCOUNTER — Ambulatory Visit (HOSPITAL_COMMUNITY): Payer: Medicare Other | Attending: Cardiology

## 2015-02-18 ENCOUNTER — Other Ambulatory Visit: Payer: Self-pay

## 2015-02-18 DIAGNOSIS — R0602 Shortness of breath: Secondary | ICD-10-CM | POA: Diagnosis not present

## 2015-02-18 DIAGNOSIS — E785 Hyperlipidemia, unspecified: Secondary | ICD-10-CM | POA: Insufficient documentation

## 2015-02-18 DIAGNOSIS — I10 Essential (primary) hypertension: Secondary | ICD-10-CM | POA: Insufficient documentation

## 2015-02-18 DIAGNOSIS — I358 Other nonrheumatic aortic valve disorders: Secondary | ICD-10-CM | POA: Insufficient documentation

## 2015-02-18 DIAGNOSIS — Z87891 Personal history of nicotine dependence: Secondary | ICD-10-CM | POA: Insufficient documentation

## 2015-02-18 DIAGNOSIS — I059 Rheumatic mitral valve disease, unspecified: Secondary | ICD-10-CM | POA: Insufficient documentation

## 2015-02-19 ENCOUNTER — Ambulatory Visit
Admission: RE | Admit: 2015-02-19 | Discharge: 2015-02-19 | Disposition: A | Discharge status: Home / Self Care | Payer: Medicare Other | Source: Ambulatory Visit | Attending: Cardiovascular Disease | Admitting: Cardiovascular Disease

## 2015-02-19 DIAGNOSIS — Z87891 Personal history of nicotine dependence: Secondary | ICD-10-CM

## 2015-02-19 DIAGNOSIS — R0602 Shortness of breath: Secondary | ICD-10-CM

## 2015-02-20 LAB — LIPID PANEL
CHOL/HDL RATIO: 1.8 ratio (ref <=5.0)
CHOLESTEROL: 149 mg/dL (ref 125–200)
HDL: 85 mg/dL (ref >=46)
LDL Cholesterol: 53 mg/dL (ref <130)
TRIGLYCERIDES: 55 mg/dL (ref <150)
VLDL: 11 mg/dL (ref <30)

## 2015-02-20 LAB — COMPREHENSIVE METABOLIC PANEL
ALBUMIN: 3.7 g/dL (ref 3.6–5.1)
ALK PHOS: 94 U/L (ref 33–130)
ALT: 17 U/L (ref 6–29)
AST: 19 U/L (ref 10–35)
BUN: 14 mg/dL (ref 7–25)
CHLORIDE: 102 mmol/L (ref 98–110)
CO2: 28 mmol/L (ref 20–31)
Calcium: 8.7 mg/dL (ref 8.6–10.4)
Creat: 0.43 mg/dL — ABNORMAL LOW (ref 0.60–0.93)
Glucose, Bld: 94 mg/dL (ref 65–99)
POTASSIUM: 4.8 mmol/L (ref 3.5–5.3)
Sodium: 140 mmol/L (ref 135–146)
TOTAL PROTEIN: 6.2 g/dL (ref 6.1–8.1)
Total Bilirubin: 0.4 mg/dL (ref 0.2–1.2)

## 2015-02-20 LAB — BASIC METABOLIC PANEL
BUN: 14 mg/dL (ref 7–25)
CALCIUM: 8.7 mg/dL (ref 8.6–10.4)
CO2: 28 mmol/L (ref 20–31)
Chloride: 102 mmol/L (ref 98–110)
Creat: 0.43 mg/dL — ABNORMAL LOW (ref 0.60–0.93)
GLUCOSE: 94 mg/dL (ref 65–99)
POTASSIUM: 4.8 mmol/L (ref 3.5–5.3)
SODIUM: 140 mmol/L (ref 135–146)

## 2015-02-20 LAB — CBC
HEMATOCRIT: 35.8 % — AB (ref 36.0–46.0)
HEMOGLOBIN: 11.4 g/dL — AB (ref 12.0–15.0)
MCH: 29 pg (ref 26.0–34.0)
MCHC: 31.8 g/dL (ref 30.0–36.0)
MCV: 91.1 fL (ref 78.0–100.0)
MPV: 13.8 fL — AB (ref 8.6–12.4)
Platelets: 242 10*3/uL (ref 150–400)
RBC: 3.93 MIL/uL (ref 3.87–5.11)
RDW: 14.1 % (ref 11.5–15.5)
WBC: 10 10*3/uL (ref 4.0–10.5)

## 2015-02-20 LAB — VITAMIN B12: VITAMIN B 12: 448 pg/mL (ref 211–911)

## 2015-02-20 LAB — TSH: TSH: 1.65 u[IU]/mL (ref 0.350–4.500)

## 2015-02-20 LAB — VITAMIN D 25 HYDROXY (VIT D DEFICIENCY, FRACTURES): Vit D, 25-Hydroxy: 41 ng/mL (ref 30–100)

## 2015-02-25 ENCOUNTER — Telehealth: Payer: Self-pay | Admitting: Cardiovascular Disease

## 2015-02-25 NOTE — Telephone Encounter (Signed)
-----   Message from Lennette Biharihomas A Kelly, MD sent at 02/24/2015  3:45 PM EST ----- Nl EF; AV sclerosis no stenosis; MAC

## 2015-02-25 NOTE — Telephone Encounter (Signed)
Judith Stevens is calLelon Perlaling about the results on her echo and Xray .Marland Kitchen. Please call   Thanks

## 2015-02-27 ENCOUNTER — Encounter: Payer: Self-pay | Admitting: *Deleted

## 2015-02-27 ENCOUNTER — Telehealth: Payer: Self-pay | Admitting: Cardiovascular Disease

## 2015-02-27 NOTE — Telephone Encounter (Signed)
Pt still waiting for her test results.

## 2015-02-27 NOTE — Telephone Encounter (Signed)
Spoke to patient Results given-- echo,and x-ray  patient request  A copy of labs, chest xray. Letter sent with both results

## 2015-03-20 ENCOUNTER — Telehealth: Payer: Self-pay | Admitting: Cardiovascular Disease

## 2015-03-20 NOTE — Telephone Encounter (Signed)
Please call,said she only wanted to talk to you.She says this in reference to her blood work and her appt in February.

## 2015-03-21 NOTE — Telephone Encounter (Signed)
Called patient and addressed all of her concerns.

## 2015-04-02 ENCOUNTER — Ambulatory Visit (INDEPENDENT_AMBULATORY_CARE_PROVIDER_SITE_OTHER): Payer: Medicare Other | Admitting: Cardiovascular Disease

## 2015-04-02 ENCOUNTER — Encounter: Payer: Self-pay | Admitting: Cardiovascular Disease

## 2015-04-02 VITALS — BP 126/82 | HR 72 | Ht 64.0 in | Wt 142.6 lb

## 2015-04-02 DIAGNOSIS — R0600 Dyspnea, unspecified: Secondary | ICD-10-CM

## 2015-04-02 DIAGNOSIS — I358 Other nonrheumatic aortic valve disorders: Secondary | ICD-10-CM

## 2015-04-02 DIAGNOSIS — I10 Essential (primary) hypertension: Secondary | ICD-10-CM | POA: Diagnosis not present

## 2015-04-02 DIAGNOSIS — I2583 Coronary atherosclerosis due to lipid rich plaque: Secondary | ICD-10-CM

## 2015-04-02 DIAGNOSIS — E785 Hyperlipidemia, unspecified: Secondary | ICD-10-CM

## 2015-04-02 DIAGNOSIS — I2510 Atherosclerotic heart disease of native coronary artery without angina pectoris: Secondary | ICD-10-CM

## 2015-04-02 NOTE — Patient Instructions (Signed)
Your physician wants you to follow-up in: 1 year or sooner if needed. You will receive a reminder letter in the mail two months in advance. If you don't receive a letter, please call our office to schedule the follow-up appointment.   If you need a refill on your cardiac medications before your next appointment, please call your pharmacy.   

## 2015-04-03 ENCOUNTER — Encounter: Payer: Self-pay | Admitting: Cardiovascular Disease

## 2015-04-03 DIAGNOSIS — I358 Other nonrheumatic aortic valve disorders: Secondary | ICD-10-CM | POA: Insufficient documentation

## 2015-04-03 NOTE — Progress Notes (Signed)
Patient ID: Judith Stevens, female   DOB: 1941/01/22, 75 y.o.   MRN: [id number]     HPI: Judith Stevens is a 75 y.o. female who presents for a 2 month cardiology evaluation.  Judith Stevens has CAD and in 2001 underwent cardiac catheterization while in [place] which showed a 60% ostial narrowing of her LAD. Repeat catheterization by me in 2009 did not show any significant interval change. Unfortunately, she has continued her ongoing tobacco habit but is now smoking significantly reduced at less than one quarter pack per day.  She has a history of hypertension as well as hyperlipidemia.  A nuclear perfusion study in August 2013 which was unchanged from 2 years previously and continued to show normal perfusion.   In December 2015 prior to undergoing right hip surgery a 2 year follow-up nuclear perfusion study on 03/13/2014 was performed preoperatively.  This continued to show normal perfusion without scar or ischemia.  She tolerated her hip surgery well.  She had smoked for 50 years before finally quitting smoking when I had seen her last.  When I last saw he 2 months ago, she had complained of experiencing increasing shortness of breath of 6 weeks duration that was not consistently exertionally precipitated.   She admitted to fatigue.  She is unaware of palpitations.  She has hyperlipidemia and has been taking atorvastatin 80 mg and Zetia 10 mg in attempt to continue to induce plaque regression.    I recommended follow-up laboratory as well as a 2-D echo Doppler study.  Her echo Doppler study was done on 02/18/2015 and showed an EF of 55-60%.  There was moderate thickening and calcification of a trileaflet aortic valve, consistent with sclerosis but without stenosis.  There was mitral annular calcification.  Laboratory revealed excellent lipid studies with a total cholesterol 149, triglycerides 55, HDL 85 and her LDL was now 53.  Thyroid function studies were normal.  Her renal function was  excellent.  LFTs were normal.  Vitamin D was normal at 41 and her vitamin B-12 level was normal at 448.  She will be requiring significant dental work and her dentist has asked her if it was okay to use antibiotics during her dental procedures.  She presents for reevaluation.   Past Medical History  Diagnosis Date  . Hypertension   . Hyperlipidemia   . CAD (coronary artery disease)     single vessel  . Tobacco abuse   . Pulmonary emboli ([facility]) 5/12  . Hx of echocardiogram 09/2009    EF >55% Normal Echo  . History of stress test 10/2011    Resting images reveal a normal pattern of perfusion in all regions. The post stress myocardial perfusion images show a normal pattern of perfusion in all region. The post stress left ventricle is normal size. the rest left ventricle is normal size, there is no scintigraphic evidence of inductible myocardial ischemia.  . Shortness of breath dyspnea     exertion or eating something shes allergic too  . Asthma   . Pneumonia     hx of  . Anxiety   . GERD (gastroesophageal reflux disease)   . Headache   . Migraines   . Arthritis     Past Surgical History  Procedure Laterality Date  . Knee surgery  01/17/2007    right  . Foot surgery  2002,2013    right  . Cardiac catheterization  10/13/2007    60% narrowing twin LAD system  . Back surgery  2012  . Sacroiliac joint fusion Right 03/28/2014    Procedure: SACROILIAC JOINT FUSION;  Surgeon: Sinclair Ship, MD;  Location: [facility];  Service: Orthopedics;  Laterality: Right;  Right sided sacroliac joint fusion    Allergies  Allergen Reactions  . Cefzil [Cefprozil] Shortness Of Breath  . Dairy Aid [Lactase] Shortness Of Breath  . Keflex [Cephalexin] Shortness Of Breath  . Levaquin [Levofloxacin] Shortness Of Breath  . Naproxen Shortness Of Breath    Shortness of breath  . Peanuts [Peanut Oil] Shortness Of Breath  . Penicillins Shortness Of Breath  . Percocet [Oxycodone-Acetaminophen] Shortness  Of Breath  . Red Dye Shortness Of Breath  . Sulfa Antibiotics Shortness Of Breath  . Tetracyclines & Related Shortness Of Breath    Current Outpatient Prescriptions  Medication Sig Dispense Refill  . albuterol (PROVENTIL HFA;VENTOLIN HFA) 108 (90 BASE) MCG/ACT inhaler Inhale 2 puffs into the lungs every 6 (six) hours as needed for wheezing.    Marland Kitchen ALPRAZolam (XANAX) 0.5 MG tablet Take 0.5 mg by mouth at bedtime as needed for anxiety.    Marland Kitchen atorvastatin (LIPITOR) 80 MG tablet Take 1 tablet (80 mg total) by mouth daily. 90 tablet 3  . butalbital-acetaminophen-caffeine (FIORICET WITH CODEINE) 50-325-40-30 MG per capsule Take 1 capsule by mouth daily as needed for headache.   5  . Cholecalciferol (VITAMIN D3) 2000 UNITS TABS Take 2,000 Units by mouth daily.     Marland Kitchen diltiazem (CARDIZEM CD) 240 MG 24 hr capsule Take 1 capsule (240 mg total) by mouth at bedtime. 90 capsule 3  . escitalopram (LEXAPRO) 20 MG tablet Take 1 tablet (20 mg total) by mouth daily. 90 tablet 0  . ezetimibe (ZETIA) 10 MG tablet Take 1 tablet (10 mg total) by mouth daily. 90 tablet 3  . fluticasone (VERAMYST) 27.5 MCG/SPRAY nasal spray Place 2 sprays into the nose daily as needed for rhinitis.    . fluticasone-salmeterol (ADVAIR HFA) 115-21 MCG/ACT inhaler Inhale 2 puffs into the lungs 2 (two) times daily.    . Multiple Vitamin (MULTI-VITAMIN DAILY PO) Take 1 tablet by mouth daily.    . nitroGLYCERIN (NITROSTAT) 0.4 MG SL tablet Place 1 tablet (0.4 mg total) under the tongue every 5 (five) minutes as needed for chest pain. Needs appointment for future refills 25 tablet 0  . ondansetron (ZOFRAN) 4 MG tablet Take 4 mg by mouth every 8 (eight) hours as needed for nausea.    . pantoprazole (PROTONIX) 40 MG tablet Take 80 mg by mouth 2 (two) times daily.     . terbutaline (BRETHINE) 5 MG tablet Take 5 mg by mouth every 6 (six) hours as needed (asthma).      No current facility-administered medications for this visit.    Social  History   Social History  . Marital Status: Widowed    Spouse Name: N/A  . Number of Children: N/A  . Years of Education: N/A   Occupational History  . Not on file.   Social History Main Topics  . Smoking status: Former Smoker -- 0.00 packs/day    Types: Cigarettes    Start date: 01/30/2014  . Smokeless tobacco: Former Systems developer    Quit date: 01/29/2014  . Alcohol Use: 2.4 oz/week    0 Standard drinks or equivalent, 2 Cans of beer, 2 Glasses of wine per week  . Drug Use: No  . Sexual Activity: Not on file   Other Topics Concern  . Not on file   Social History Narrative  Family History  Problem Relation Age of Onset  . Heart attack Mother   . Diabetes Mother   . Heart attack Father   . Diabetes Sister   . Hyperlipidemia Sister    Socially, she is widowed. She has 2 children 5 grandchildren. She does not exercise. She currently smokes one quarter pack per day. There is occasional alcohol use.  ROS General: Negative; No fevers, chills, or night sweats;  HEENT: Negative; No changes in vision or hearing, sinus congestion, difficulty swallowing Pulmonary: Negative; No cough, wheezing, shortness of breath, hemoptysis Cardiovascular: Negative; No chest pain, presyncope, syncope, palpitations GI: positive for GERD; No nausea, vomiting, diarrhea, or abdominal pain GU: Negative; No dysuria, hematuria, or difficulty voiding Musculoskeletal: She tolerated her hip surgery well. Hematologic/Oncology: Negative; no easy bruising, bleeding Endocrine: Negative; no heat/cold intolerance; no diabetes Neuro: Negative; no changes in balance, headaches Skin: Negative; No rashes or skin lesions Psychiatric: Negative; No behavioral problems, depression Sleep: Negative; No snoring, daytime sleepiness, hypersomnolence, bruxism, restless legs, hypnogognic hallucinations, no cataplexy Other comprehensive 14 point system review is negative.  Nutritionally she cannot have dairy products due to  significant milk allergy.  PE BP 126/82 mmHg  Pulse 72  Ht _0  (1.626 m)  Wt 142 lb 9.6 oz (64.683 kg)  BMI 24.47 kg/m2   Wt Readings from Last 3 Encounters:  04/02/15 142 lb 9.6 oz (64.683 kg)  02/11/15 147 lb (66.679 kg)  03/28/14 145 lb (65.772 kg)   General: Alert, oriented, no distress.  Skin: normal turgor, no rashes HEENT: Normocephalic, atraumatic. Pupils round and reactive; sclera anicteric;no lid lag.  Nose without nasal septal hypertrophy Mouth/Parynx benign; Mallinpatti scale 3 Neck: No JVD, no carotid bruitswith normal carotid upstroke Chest wall: Nontender to palpationLungs: clear to ausculatation and percussion; no wheezing or rales Heart: RRR, s1 s2 normal 1/6 sem; .Marland Kitchen  No diastolic murmur.  No S3 or S4 gallop.  No rubs thrills or heaves. Abdomen: Very faint mid epigastric bruit.  soft, nontender; no hepatosplenomehaly, BS+; abdominal aorta nontender and not dilated by palpation. Back: No CVA tenderness Pulses 2+ Extremities: no clubbing cyanosis or edema, Homan's sign negative  Neurologic: grossly nonfocal Psychologic: normal affect and mood.  She declined having an ECG today.  November 2016 ECG (independently read by me): Normal sinus rhythm at 60 bpm.  No significant ST-T changes.  Normal intervals.  November 2015 ECG (independently read by me): Normal sinus rhythm at 72 bpm.  Normal intervals.  No significant ST-T changes.  Prior October 2014 ECG: Sinus rhythm with occasional isolated PVC. Heart rate 68 beats per minute. QTc interval 440 ms.  LAB: BMP Latest Ref Rng 02/19/2015 02/19/2015 03/21/2014  Glucose 65 - 99 mg/dL 94 94 96  BUN 7 - 25 mg/dL _1 Creatinine 0.60 - 0.93 mg/dL 0.43(L) 0.43(L) 0.56  Sodium 135 - 146 mmol/L 140 140 142  Potassium 3.5 - 5.3 mmol/L 4.8 4.8 4.2  Chloride 98 - 110 mmol/L 102 102 107  CO2 20 - 31 mmol/L _2 Calcium 8.6 - 10.4 mg/dL 8.7 8.7 9.1   Hepatic Function Latest Ref Rng 02/19/2015 03/21/2014  01/25/2014  Total Protein 6.1 - 8.1 g/dL 6.2 5.9(L) 6.4  Albumin 3.6 - 5.1 g/dL 3.7 3.9 4.3  AST 10 - 35 U/L _3 ALT 6 - 29 U/L _4 Alk Phosphatase 33 - 130 U/L 94 101 98  Total Bilirubin 0.2 - 1.2 mg/dL 0.4 0.3 0.3  CBC Latest Ref Rng 02/19/2015 03/21/2014 01/25/2014  WBC 4.0 - 10.5 K/uL 10.0 5.3 5.6  Hemoglobin 12.0 - 15.0 g/dL 11.4(L) 11.7(L) 12.3  Hematocrit 36.0 - 46.0 % 35.8(L) 35.5(L) 36.6  Platelets 150 - 400 K/uL 242 220 222   Lab Results  Component Value Date   MCV 91.1 02/19/2015   MCV 92.4 03/21/2014   MCV 90.1 01/25/2014   Lab Results  Component Value Date   TSH 1.650 02/19/2015   No results found for: HGBA1C   Lipid Panel     Component Value Date/Time   CHOL 149 02/19/2015 0939   TRIG 55 02/19/2015 0939   HDL 85 02/19/2015 0939   CHOLHDL 1.8 02/19/2015 0939   VLDL 11 02/19/2015 0939   LDLCALC 53 02/19/2015 0939    RADIOLOGY: No results found.    ASSESSMENT AND PLAN: Judith Stevens is a 75 year old white female who had smoked for over 50 years, but fortunately quit smoking one year ago.  She has been documented to have CAD with a 60% ostial LAD stenosis dating back to 2001 and which again was noted on her repeat catheterization by me in 2009.  We have been aggressively treating her lipids in attempt to induce plaque regression and she is tolerated Lipitor 80 mg in addition to Zetia 10 mg. I reviewed her most recent laboratory with her in detail.  Her most recent lipid studies are excellent with an LDL of 53, which hopefully is inducing plaque regression continued stability.  Her blood pressure today is controlled on Cardizem CD 240 mg.  She has GERD but this is controlled with Protonix.  She had experienced shortness of breath.  I reviewed her echo Doppler findings with her in detail.  She had normal systolic function without regional wall motion abnormalities and there was no mention of diastolic dysfunction.  There is evidence for aortic valve  sclerosis but mobility was not restricted and there was no evidence for aortic stenosis.  I suspect some of her dyspnea is due to her lung disease.  She has a history of asthma, but presently is not wheezing on her current treatment regimen consisting of Advair, when necessary albuterol, and Brethine.  She does not require prophylactic antibiotics for SBE based on her echo Doppler findings .  However, I understand her dental procedure will be significant and she should be able to tolerate antibiotics from a cardiovascular standpoint.  She will continue her current medical regimen.  I will see her one year for reevaluation.  Time spent: 25 minutes  Troy Sine, MD, [facility]  04/03/2015 6:53 PM

## 2015-04-22 ENCOUNTER — Ambulatory Visit: Payer: Medicare Other | Admitting: Cardiovascular Disease

## 2015-05-17 ENCOUNTER — Ambulatory Visit: Payer: Medicare Other | Admitting: Cardiovascular Disease

## 2016-01-24 ENCOUNTER — Ambulatory Visit
Admission: RE | Admit: 2016-01-24 | Discharge: 2016-01-24 | Disposition: A | Discharge status: Home / Self Care | Payer: Medicare Other | Source: Ambulatory Visit | Attending: Physician Assistant | Admitting: Physician Assistant

## 2016-01-24 ENCOUNTER — Encounter: Payer: Self-pay | Admitting: Physician Assistant

## 2016-01-24 ENCOUNTER — Ambulatory Visit (INDEPENDENT_AMBULATORY_CARE_PROVIDER_SITE_OTHER): Payer: Medicare Other | Admitting: Physician Assistant

## 2016-01-24 VITALS — BP 124/62 | HR 67 | Ht 64.0 in | Wt 139.0 lb

## 2016-01-24 DIAGNOSIS — R0602 Shortness of breath: Secondary | ICD-10-CM

## 2016-01-24 DIAGNOSIS — I2510 Atherosclerotic heart disease of native coronary artery without angina pectoris: Secondary | ICD-10-CM

## 2016-01-24 DIAGNOSIS — R0609 Other forms of dyspnea: Secondary | ICD-10-CM

## 2016-01-24 DIAGNOSIS — I2583 Coronary atherosclerosis due to lipid rich plaque: Secondary | ICD-10-CM

## 2016-01-24 NOTE — Progress Notes (Signed)
Cardiology Office Note   Date:  01/24/2016   ID:  IRIDIAN READER, DOB Nov 24, 1940, MRN [id number]  PCP:  Lilia Argue  Cardiologist:  Dr Tresa Endo, 04/03/2015  Theodore Demark, PA-C   History of Present Illness: Judith Stevens is a 75 y.o. female with a history of cath 2009 w/ 60% LAD, tob use (quit 2016), HTN, HLD, MV ok 2015, echo 01/2015 w/ EF 55-60% & AoV sclerosis, GERD, DOE felt 2nd lung dz, remote PE.  Judith Stevens presents for evaluation of shortness of breath.  She has chronic dyspnea on exertion. However, she had 2 days where her dyspnea on exertion was much worse. She felt like she could barely do anything without getting short of breath and having to stop. She felt like her activity was significantly limited by shortness of breath. Her symptoms lasted for 2 days, and then got a little better. She does not remember any wheezing, and did not have any chest pain. She tried her albuterol inhaler which did not seem to help very much. She took the terbutaline that day as directed and felt like she got a little bit of benefit with that. She did not take any nitroglycerin.  Since then, she has not had any more exacerbations of her chronic shortness of breath. She has not had any lower extremity edema, and denies orthopnea. She has woken up coughing a couple of times. She has had no fevers. The cough is nonproductive. She has not worked for wheezing. She has had no lower extremity edema.  She thinks she may have a sinus infection, because she has nasal congestion. She is thinking about starting antibiotics. She has not had any fevers and no unusual drainage.    Past Medical History:  Diagnosis Date  . Anxiety   . Arthritis   . Asthma   . CAD (coronary artery disease)    single vessel  . GERD (gastroesophageal reflux disease)   . Headache   . History of stress test 10/2011   Resting images reveal a normal pattern of perfusion in all regions. The post  stress myocardial perfusion images show a normal pattern of perfusion in all region. The post stress left ventricle is normal size. the rest left ventricle is normal size, there is no scintigraphic evidence of inductible myocardial ischemia.  Marland Kitchen Hx of echocardiogram 09/2009   EF >55% Normal Echo  . Hyperlipidemia   . Hypertension   . Migraines   . Pneumonia    hx of  . Pulmonary emboli ([facility]) 5/12  . Shortness of breath dyspnea    exertion or eating something shes allergic too  . Tobacco abuse     Past Surgical History:  Procedure Laterality Date  . BACK SURGERY  2012  . CARDIAC CATHETERIZATION  10/13/2007   60% narrowing twin LAD system  . FOOT SURGERY  2002,2013   right  . KNEE SURGERY  01/17/2007   right  . SACROILIAC JOINT FUSION Right 03/28/2014   Procedure: SACROILIAC JOINT FUSION;  Surgeon: Emilee Hero, MD;  Location: [facility];  Service: Orthopedics;  Laterality: Right;  Right sided sacroliac joint fusion    Current Outpatient Prescriptions  Medication Sig Dispense Refill  . ALPRAZolam (XANAX) 0.5 MG tablet Take 0.5 mg by mouth at bedtime as needed for anxiety.    Marland Kitchen atorvastatin (LIPITOR) 80 MG tablet Take 1 tablet (80 mg total) by mouth daily. 90 tablet 3  . butalbital-acetaminophen-caffeine (FIORICET WITH CODEINE) 50-325-40-30 MG per capsule  Take 1 capsule by mouth daily as needed for headache.   5  . Cholecalciferol (VITAMIN D3) 2000 UNITS TABS Take 2,000 Units by mouth daily.     Marland Kitchen diltiazem (CARDIZEM CD) 240 MG 24 hr capsule Take 1 capsule (240 mg total) by mouth at bedtime. 90 capsule 3  . escitalopram (LEXAPRO) 20 MG tablet Take 1 tablet (20 mg total) by mouth daily. 90 tablet 0  . ezetimibe (ZETIA) 10 MG tablet Take 1 tablet (10 mg total) by mouth daily. 90 tablet 3  . Multiple Vitamin (MULTI-VITAMIN DAILY PO) Take 1 tablet by mouth daily.    . nitroGLYCERIN (NITROSTAT) 0.4 MG SL tablet Place 1 tablet (0.4 mg total) under the tongue every 5 (five) minutes as  needed for chest pain. Needs appointment for future refills 25 tablet 0  . ondansetron (ZOFRAN) 4 MG tablet Take 4 mg by mouth every 8 (eight) hours as needed for nausea.    . pantoprazole (PROTONIX) 40 MG tablet Take 80 mg by mouth 2 (two) times daily.     . terbutaline (BRETHINE) 5 MG tablet Take 5 mg by mouth every 6 (six) hours as needed (asthma).     . VENTOLIN HFA 108 (90 Base) MCG/ACT inhaler INHALE 2 PUFFS INTO THE LUNGS EVERY 6 (SIX) HOURS AS NEEDED FOR WHEEZING.  11   No current facility-administered medications for this visit.     Allergies:   Cefzil [cefprozil]; Dairy aid [lactase]; Keflex [cephalexin]; Levaquin [levofloxacin]; Naproxen; Oxycodone-acetaminophen; Peanuts [peanut oil]; Penicillins; Percocet [oxycodone-acetaminophen]; Red dye; Sulfa antibiotics; Sulfacetamide sodium; Tetracycline hcl; Tetracyclines & related; Methocarbamol; Amoxicillin-pot clavulanate; Ibuprofen; Moxifloxacin; Penicillin g; Sulfamethoxazole-trimethoprim; Tetracycline; and Tramadol    Social History:  The patient  reports that she has quit smoking. Her smoking use included Cigarettes. She started smoking about 1 years ago. She smoked 0.00 packs per day. She quit smokeless tobacco use about 1 years ago. She reports that she drinks about 2.4 oz of alcohol per week . She reports that she does not use drugs.   Family History:  The patient's family history includes Diabetes in her mother and sister; Heart attack in her father and mother; Hyperlipidemia in her sister.    ROS:  Please see the history of present illness. All other systems are reviewed and negative.    PHYSICAL EXAM: VS:  BP 124/62   Pulse 67   Ht 5\' 4"  (1.626 m)   Wt 139 lb (63 kg)   BMI 23.86 kg/m  , BMI Body mass index is 23.86 kg/m. GEN: Well nourished, well developed, female in no acute distress  HEENT: normal for age  Neck: Minimal JVD, negative HJR, no carotid bruit, no masses Cardiac: RRR; soft murmur, no rubs, or  gallops Respiratory:  Scattered rales bilaterally, more in the bases, normal work of breathing GI: soft, nontender, nondistended, + BS MS: no deformity or atrophy; no edema; distal pulses are 2+ in all 4 extremities   Skin: warm and dry, no rash Neuro:  Strength and sensation are intact Psych: euthymic mood, full affect   EKG:  EKG is ordered today. The ekg ordered today demonstrates sinus rhythm, rate 67, no acute ischemic changes, wandering baseline   Recent Labs: 02/19/2015: ALT 17; BUN 14; BUN 14; Creat 0.43; Creat 0.43; Hemoglobin 11.4; Platelets 242; Potassium 4.8; Potassium 4.8; Sodium 140; Sodium 140; TSH 1.650    Lipid Panel    Component Value Date/Time   CHOL 149 02/19/2015 0939   TRIG 55 02/19/2015 0939  HDL 85 02/19/2015 0939   CHOLHDL 1.8 02/19/2015 0939   VLDL 11 02/19/2015 0939   LDLCALC 53 02/19/2015 0939     Wt Readings from Last 3 Encounters:  01/24/16 139 lb (63 kg)  04/02/15 142 lb 9.6 oz (64.7 kg)  02/11/15 147 lb (66.7 kg)     Other studies Reviewed: Additional studies/ records that were reviewed today include: Office notes, hospital records and testing.  ASSESSMENT AND PLAN:  1.  Dyspnea on exertion: She had an exacerbation in her symptoms, with no obvious cause. She is very sure that she was not having any wheezing. Her albuterol inhaler did not help, and the terbutaline did not help very much. She is concerned about her symptoms. She has mild volume overload by exam, but because of her chronic lung disease, this is difficult to assess.  We will check a chest x-ray, and echocardiogram a BMET, and a BNP. If those are all normal, she can get a stress test. She got short of breath with her previous stress test, done with adenosine. I discussed this with her and reassured her that the adenosine can be reversed, and because of that I feel it is a very safe test. I advised that if we don't do adenosine, we have to do dobutamine which cannot be reversed and  tends to cause a lot of symptoms. She understands that the adenosine can always be reversed if needed and feels she can tolerate it.  2. Tobacco use: She has not started smoking again. She has been quit over a year now. Continued cessation was encouraged.  3. CAD: There is nonobstructive by last cath. She is having no ongoing ischemic symptoms. She is compliant with medications to control her cholesterol. Because of her history of asthma, she is not on aspirin or beta blocker. Her blood pressure is well controlled. No medication changes at this time.   Current medicines are reviewed at length with the patient today.  The patient does not have concerns regarding medicines.  The following changes have been made:  no change  Labs/ tests ordered today include:   Orders Placed This Encounter  Procedures  . DG Chest 2 View  . Brain natriuretic peptide  . BASIC METABOLIC PANEL WITH GFR  . EKG 12-Lead  . ECHOCARDIOGRAM COMPLETE     Disposition:   FU with Dr. Tresa EndoKelly  Signed, Theodore DemarkBarrett, Jeffre Enriques, PA-C  01/24/2016 11:45 AM    [facility] Phone: [phone](336) [phone]; Fax: [phone]  This note was written with the assistance of speech recognition software. Please excuse any transcriptional errors.

## 2016-01-24 NOTE — Patient Instructions (Signed)
Medication Instructions: Your physician recommends that you continue on your current medications as directed. Please refer to the Current Medication list given to you today.   Labwork: Your physician recommends that you return for lab work today: BNP, BMET   Testing/Procedures: A chest x-ray takes a picture of the organs and structures inside the chest, including the heart, lungs, and blood vessels. This test can show several things, including, whether the heart is enlarges; whether fluid is building up in the lungs; and whether pacemaker / defibrillator leads are still in place. TODAY  Your physician has requested that you have an echocardiogram. Echocardiography is a painless test that uses sound waves to create images of your heart. It provides your doctor with information about the size and shape of your heart and how well your heart's chambers and valves are working. This procedure takes approximately one hour. There are no restrictions for this procedure. IN 1 WEEK   Follow-Up: We will call you with your test results. If they are abnormal, we will schedule an appointment with Theodore Demarkhonda Barrett, PA-C sooner. If normal, keep your follow-up appointment with Dr. Tresa EndoKelly in January when scheduled.  If you need a refill on your cardiac medications before your next appointment, please call your pharmacy.

## 2016-01-25 LAB — BASIC METABOLIC PANEL WITH GFR
BUN: 7 mg/dL (ref 7–25)
CALCIUM: 9.5 mg/dL (ref 8.6–10.4)
CO2: 27 mmol/L (ref 20–31)
Chloride: 101 mmol/L (ref 98–110)
Creat: 0.5 mg/dL — ABNORMAL LOW (ref 0.60–0.93)
GLUCOSE: 99 mg/dL (ref 65–99)
POTASSIUM: 5 mmol/L (ref 3.5–5.3)
Sodium: 141 mmol/L (ref 135–146)

## 2016-01-25 LAB — BRAIN NATRIURETIC PEPTIDE: BRAIN NATRIURETIC PEPTIDE: 24.4 pg/mL (ref <100)

## 2016-01-28 ENCOUNTER — Telehealth: Payer: Self-pay | Admitting: Physician Assistant

## 2016-01-28 NOTE — Telephone Encounter (Signed)
Pt would like her Xray and Lab results from Friday please.

## 2016-01-28 NOTE — Telephone Encounter (Signed)
Spoke with pt, aware of cxr and lab result recommendations. She has a f/u with dr Tresa Endokelly in January.

## 2016-01-30 ENCOUNTER — Telehealth: Payer: Self-pay | Admitting: *Deleted

## 2016-01-30 NOTE — Telephone Encounter (Signed)
Patient notified of lab results and CXR. She states that she has a echo scheduled a nd will await the results of this. She also states that she is due to see Dr Tresa Endokelly in January and will call to schedule appointment to follow up with him.

## 2016-01-30 NOTE — Telephone Encounter (Signed)
-----   Message from Darrol Jumphonda G Barrett, PA-C sent at 01/28/2016  8:15 AM EDT ----- Pt of Dr Tresa EndoKelly Please let her know that her labs and CXR were ok.  Offer a Lexi MV, if she does not want it, she should f/u with  Her primary MD to discuss respiratory issues. Thanks

## 2016-02-13 ENCOUNTER — Other Ambulatory Visit (HOSPITAL_COMMUNITY): Payer: Medicare Other

## 2016-03-25 ENCOUNTER — Other Ambulatory Visit (HOSPITAL_COMMUNITY): Payer: Medicare Other

## 2016-04-06 ENCOUNTER — Ambulatory Visit: Payer: Medicare Other | Admitting: Cardiovascular Disease

## 2016-04-09 ENCOUNTER — Ambulatory Visit: Payer: Medicare Other | Admitting: Allergy

## 2016-04-13 ENCOUNTER — Ambulatory Visit: Payer: Medicare Other | Admitting: Allergy and Immunology

## 2016-04-22 ENCOUNTER — Ambulatory Visit (INDEPENDENT_AMBULATORY_CARE_PROVIDER_SITE_OTHER): Payer: Medicare Other | Admitting: Cardiovascular Disease

## 2016-04-22 ENCOUNTER — Encounter: Payer: Self-pay | Admitting: Cardiovascular Disease

## 2016-04-22 VITALS — BP 126/70 | HR 66 | Ht 64.0 in | Wt 139.8 lb

## 2016-04-22 DIAGNOSIS — E785 Hyperlipidemia, unspecified: Secondary | ICD-10-CM | POA: Diagnosis not present

## 2016-04-22 DIAGNOSIS — J45909 Unspecified asthma, uncomplicated: Secondary | ICD-10-CM

## 2016-04-22 DIAGNOSIS — Z87891 Personal history of nicotine dependence: Secondary | ICD-10-CM

## 2016-04-22 DIAGNOSIS — I358 Other nonrheumatic aortic valve disorders: Secondary | ICD-10-CM

## 2016-04-22 DIAGNOSIS — R0609 Other forms of dyspnea: Secondary | ICD-10-CM

## 2016-04-22 DIAGNOSIS — I10 Essential (primary) hypertension: Secondary | ICD-10-CM | POA: Diagnosis not present

## 2016-04-22 DIAGNOSIS — I2510 Atherosclerotic heart disease of native coronary artery without angina pectoris: Secondary | ICD-10-CM

## 2016-04-22 MED ORDER — ATORVASTATIN CALCIUM 80 MG PO TABS: 80.0000 mg | ORAL_TABLET | Freq: Every day | ORAL | 3 refills | Status: DC

## 2016-04-22 MED ORDER — EZETIMIBE 10 MG PO TABS: 10.0000 mg | ORAL_TABLET | Freq: Every day | ORAL | 3 refills | Status: DC

## 2016-04-22 MED ORDER — DILTIAZEM HCL ER COATED BEADS 240 MG PO CP24: 240.0000 mg | ORAL_CAPSULE | Freq: Every day | ORAL | 3 refills | Status: DC

## 2016-04-22 NOTE — Patient Instructions (Addendum)
Medication Instructions:  Your physician recommends that you continue on your current medications as directed. Please refer to the Current Medication list given to you today.  Labwork: Your physician recommends that you return for lab work in: TODAY FASTING- TSH, CBC, CMET, LIPID  Testing/Procedures: NONE  Follow-Up: Your physician wants you to follow-up in: 6 MONTHS WITH DR Tresa EndoKELLY. You will receive a reminder letter in the mail two months in advance. If you don't receive a letter, please call our office to schedule the follow-up appointment.   Any Other Special Instructions Will Be Listed Below (If Applicable).  If you need a refill on your cardiac medications before your next appointment, please call your pharmacy.

## 2016-04-22 NOTE — Progress Notes (Signed)
Patient ID: Judith Stevens, female   DOB: 17-Apr-1940, 76 y.o.   MRN: [id number]     HPI: Judith Stevens is a 76 y.o. female who presents for a 85 month cardiology evaluation.  Judith Stevens has CAD and in 2001 underwent cardiac catheterization while in [place] which showed a 60% ostial narrowing of her LAD. Repeat catheterization by me in 2009 did not show any significant interval change. Unfortunately, Judith Stevens has continued her ongoing tobacco habit but is now smoking significantly reduced at less than one quarter pack per day.  Judith Stevens has a history of hypertension as well as hyperlipidemia.  A nuclear perfusion study in August 2013 which was unchanged from 2 years previously and continued to show normal perfusion.   In December 2015 prior to undergoing right hip surgery a 2 year follow-up nuclear perfusion study on 03/13/2014 was performed preoperatively.  This continued to show normal perfusion without scar or ischemia.  Judith Stevens tolerated her hip surgery well.  Judith Stevens had smoked for 50 years before finally quitting smoking when I had seen her last.  When I last saw he 2 months ago, Judith Stevens had complained of experiencing increasing shortness of breath of 6 weeks duration that was not consistently exertionally precipitated.   Judith Stevens admitted to fatigue.  Judith Stevens is unaware of palpitations.  Judith Stevens has hyperlipidemia and has been taking atorvastatin 80 mg and Zetia 10 mg in attempt to continue to induce plaque regression.    An echo Doppler study on 02/18/2015 showed an EF of 55-60%, moderate thickening and calcification of a trileaflet aortic valve, consistent with sclerosis but without stenosis and mitral annular calcification.  Laboratory revealed excellent lipid studies with a total cholesterol 149, triglycerides 55, HDL 85 and her LDL was now 53.  Thyroid function studies were normal.  Her renal function was excellent.  LFTs were normal.  Vitamin D was normal at 41 and her vitamin B-12 level was normal at 448.     Since I last saw her one year ago, Judith Stevens was seen by [name] in October 2017 with complaints of dyspnea on exertion.  Judith Stevens has a history of asthma as well as numerous allergies to foods and some drugs.  Judith Stevens tells me Judith Stevens had the flu in early January 2018.  Scheduled to undergo a follow-up echo Doppler study on January 30 118, which was scheduled at the time of her office visit with [name].  Judith Stevens denies chest tightness.  Judith Stevens denies palpitations.  Judith Stevens admits to healthy eating.  Judith Stevens has not had recent laboratory.  Past Medical History:  Diagnosis Date  . Anxiety   . Arthritis   . Asthma   . CAD (coronary artery disease)    single vessel  . GERD (gastroesophageal reflux disease)   . Headache   . History of stress test 10/2011   Resting images reveal a normal pattern of perfusion in all regions. The post stress myocardial perfusion images show a normal pattern of perfusion in all region. The post stress left ventricle is normal size. the rest left ventricle is normal size, there is no scintigraphic evidence of inductible myocardial ischemia.  Marland Kitchen Hx of echocardiogram 09/2009   EF >55% Normal Echo  . Hyperlipidemia   . Hypertension   . Migraines   . Pneumonia    hx of  . Pulmonary emboli ([place]) 5/12  . Shortness of breath dyspnea    exertion or eating something shes allergic too  . Tobacco abuse  Past Surgical History:  Procedure Laterality Date  . BACK SURGERY  2012  . CARDIAC CATHETERIZATION  10/13/2007   60% narrowing twin LAD system  . FOOT SURGERY  2002,2013   right  . KNEE SURGERY  01/17/2007   right  . SACROILIAC JOINT FUSION Right 03/28/2014   Procedure: SACROILIAC JOINT FUSION;  Surgeon: Sinclair Ship, MD;  Location: [facility];  Service: Orthopedics;  Laterality: Right;  Right sided sacroliac joint fusion    Allergies  Allergen Reactions  . Cefzil [Cefprozil] Shortness Of Breath  . Keflex [Cephalexin] Shortness Of Breath and Rash  . Lactose Intolerance  (Gi) Shortness Of Breath    ALL DAIRY PRODUCTS  . Levaquin [Levofloxacin] Shortness Of Breath and Rash  . Naproxen Shortness Of Breath    Shortness of breath Shortness of breath  . Oxycodone-Acetaminophen Nausea And Vomiting and Shortness Of Breath  . Peanuts [Peanut Oil] Shortness Of Breath  . Penicillins Shortness Of Breath  . Percocet [Oxycodone-Acetaminophen] Shortness Of Breath  . Red Dye Shortness Of Breath  . Sulfa Antibiotics Shortness Of Breath  . Sulfacetamide Sodium Shortness Of Breath  . Tetracycline Hcl Shortness Of Breath  . Tetracyclines & Related Shortness Of Breath  . Methocarbamol Nausea And Vomiting  . Amoxicillin-Pot Clavulanate Rash  . Ibuprofen Rash  . Moxifloxacin Rash  . Penicillin G Rash  . Sulfamethoxazole-Trimethoprim Rash  . Tetracycline Rash  . Tramadol Rash    Current Outpatient Prescriptions  Medication Sig Dispense Refill  . ALPRAZolam (XANAX) 0.5 MG tablet Take 0.5 mg by mouth at bedtime as needed for anxiety.    Marland Kitchen atorvastatin (LIPITOR) 80 MG tablet Take 1 tablet (80 mg total) by mouth daily. 90 tablet 3  . butalbital-acetaminophen-caffeine (FIORICET WITH CODEINE) 50-325-40-30 MG per capsule Take 1 capsule by mouth daily as needed for headache.   5  . Cholecalciferol (VITAMIN D3) 2000 UNITS TABS Take 2,000 Units by mouth daily.     Marland Kitchen diltiazem (CARDIZEM CD) 240 MG 24 hr capsule Take 1 capsule (240 mg total) by mouth at bedtime. 90 capsule 3  . escitalopram (LEXAPRO) 20 MG tablet Take 1 tablet (20 mg total) by mouth daily. 90 tablet 0  . ezetimibe (ZETIA) 10 MG tablet Take 1 tablet (10 mg total) by mouth daily. 90 tablet 3  . Fluticasone-Salmeterol (ADVAIR DISKUS) 250-50 MCG/DOSE AEPB Inhale 1 puff into the lungs 2 (two) times daily.    . Multiple Vitamin (MULTI-VITAMIN DAILY PO) Take 1 tablet by mouth daily.    . nitroGLYCERIN (NITROSTAT) 0.4 MG SL tablet Place 1 tablet (0.4 mg total) under the tongue every 5 (five) minutes as needed for chest  pain. Needs appointment for future refills 25 tablet 0  . ondansetron (ZOFRAN) 4 MG tablet Take 4 mg by mouth every 8 (eight) hours as needed for nausea.    . pantoprazole (PROTONIX) 40 MG tablet Take 80 mg by mouth 2 (two) times daily.     . terbutaline (BRETHINE) 5 MG tablet Take 5 mg by mouth every 6 (six) hours as needed (asthma).     . VENTOLIN HFA 108 (90 Base) MCG/ACT inhaler INHALE 2 PUFFS INTO THE LUNGS EVERY 6 (SIX) HOURS AS NEEDED FOR WHEEZING.  11   No current facility-administered medications for this visit.     Social History   Social History  . Marital status: Widowed    Spouse name: N/A  . Number of children: N/A  . Years of education: N/A   Occupational History  .  Not on file.   Social History Main Topics  . Smoking status: Former Smoker    Packs/day: 0.00    Types: Cigarettes    Start date: 01/30/2014  . Smokeless tobacco: Former Systems developer    Quit date: 01/29/2014  . Alcohol use 2.4 oz/week    2 Glasses of wine, 2 Cans of beer per week  . Drug use: No  . Sexual activity: Not on file   Other Topics Concern  . Not on file   Social History Narrative  . No narrative on file    Family History  Problem Relation Age of Onset  . Heart attack Mother   . Diabetes Mother   . Heart attack Father   . Diabetes Sister   . Hyperlipidemia Sister    Socially, Judith Stevens is widowed. Judith Stevens has 2 children 5 grandchildren. Judith Stevens does not exercise. Judith Stevens quit smoking 2 years ago.   ROS General: Negative; No fevers, chills, or night sweats;  HEENT: Negative; No changes in vision or hearing, sinus congestion, difficulty swallowing Pulmonary: Positive for asthma Cardiovascular: Negative; No chest pain, presyncope, syncope, palpitations GI: positive for GERD; No nausea, vomiting, diarrhea, or abdominal pain GU: Negative; No dysuria, hematuria, or difficulty voiding Musculoskeletal: Judith Stevens tolerated her hip surgery well. Hematologic/Oncology: Negative; no easy bruising, bleeding Endocrine:  Negative; no heat/cold intolerance; no diabetes Neuro: Negative; no changes in balance, headaches Skin: Negative; No rashes or skin lesions Psychiatric: Negative; No behavioral problems, depression Sleep: Negative; No snoring, daytime sleepiness, hypersomnolence, bruxism, restless legs, hypnogognic hallucinations, no cataplexy Other comprehensive 14 point system review is negative.  Nutritionally Judith Stevens cannot have dairy products due to significant milk allergy.  PE BP 126/70   Pulse 66   Ht _0  (1.626 m)   Wt 139 lb 12.8 oz (63.4 kg)   LMP  (LMP Unknown)   BMI 24.00 kg/m    Repeat blood pressure by me 122/64  Wt Readings from Last 3 Encounters:  04/22/16 139 lb 12.8 oz (63.4 kg)  01/24/16 139 lb (63 kg)  04/02/15 142 lb 9.6 oz (64.7 kg)   General: Alert, oriented, no distress.  Skin: normal turgor, no rashes HEENT: Normocephalic, atraumatic. Pupils round and reactive; sclera anicteric;no lid lag.  Nose without nasal septal hypertrophy Mouth/Parynx benign; Mallinpatti scale 3 Neck: No JVD, no carotid bruitswith normal carotid upstroke Chest wall: Nontender to palpationLungs: clear to ausculatation and percussion; no wheezing or rales Heart: RRR, s1 s2 normal 1/6 sem; .Marland Kitchen  No diastolic murmur.  No S3 or S4 gallop.  No rubs thrills or heaves. Abdomen: Very faint mid epigastric bruit.  soft, nontender; no hepatosplenomehaly, BS+; abdominal aorta nontender and not dilated by palpation. Back: No CVA tenderness Pulses 2+ Extremities: no clubbing cyanosis or edema, Homan's sign negative  Neurologic: grossly nonfocal Psychologic: normal affect and mood.  ECG (independently read by me): Normal sinus rhythm at 69 bpm.  No ectopy.  Normal intervals.  November 2016 ECG (independently read by me): Normal sinus rhythm at 60 bpm.  No significant ST-T changes.  Normal intervals.  November 2015 ECG (independently read by me): Normal sinus rhythm at 72 bpm.  Normal intervals.  No significant  ST-T changes.  Prior October 2014 ECG: Sinus rhythm with occasional isolated PVC. Heart rate 68 beats per minute. QTc interval 440 ms.  LAB: BMP Latest Ref Rng & Units 01/24/2016 02/19/2015 02/19/2015  Glucose 65 - 99 mg/dL 99 94 94  BUN 7 - 25 mg/dL _1 Creatinine 0.60 -  0.93 mg/dL 0.50(L) 0.43(L) 0.43(L)  Sodium 135 - 146 mmol/L 141 140 140  Potassium 3.5 - 5.3 mmol/L 5.0 4.8 4.8  Chloride 98 - 110 mmol/L 101 102 102  CO2 20 - 31 mmol/L _0 Calcium 8.6 - 10.4 mg/dL 9.5 8.7 8.7   Hepatic Function Latest Ref Rng & Units 02/19/2015 03/21/2014 01/25/2014  Total Protein 6.1 - 8.1 g/dL 6.2 5.9(L) 6.4  Albumin 3.6 - 5.1 g/dL 3.7 3.9 4.3  AST 10 - 35 U/L _1 ALT 6 - 29 U/L _2 Alk Phosphatase 33 - 130 U/L 94 101 98  Total Bilirubin 0.2 - 1.2 mg/dL 0.4 0.3 0.3   CBC Latest Ref Rng & Units 02/19/2015 03/21/2014 01/25/2014  WBC 4.0 - 10.5 K/uL 10.0 5.3 5.6  Hemoglobin 12.0 - 15.0 g/dL 11.4(L) 11.7(L) 12.3  Hematocrit 36.0 - 46.0 % 35.8(L) 35.5(L) 36.6  Platelets 150 - 400 K/uL 242 220 222   Lab Results  Component Value Date   MCV 91.1 02/19/2015   MCV 92.4 03/21/2014   MCV 90.1 01/25/2014   Lab Results  Component Value Date   TSH 1.650 02/19/2015   No results found for: HGBA1C   Lipid Panel     Component Value Date/Time   CHOL 149 02/19/2015 0939   TRIG 55 02/19/2015 0939   HDL 85 02/19/2015 0939   CHOLHDL 1.8 02/19/2015 0939   VLDL 11 02/19/2015 0939   LDLCALC 53 02/19/2015 0939    RADIOLOGY: No results found.  IMPRESSION:  1. Aortic valve sclerosis   2. Essential hypertension   3. Hyperlipidemia, unspecified hyperlipidemia type   4. Coronary artery disease involving native coronary artery of native heart without angina pectoris   5. DOE (dyspnea on exertion)   6. Hx of tobacco use, presenting hazards to health   7. Hyperlipidemia LDL goal <70   8. Mild asthma without complication, unspecified whether persistent     ASSESSMENT AND  PLAN: Judith Stevens is a 76 year old white female who had smoked for over 50 years, but fortunately quit smoking 2 years ago.  Judith Stevens has been documented to have CAD with a 60% ostial LAD stenosis dating back to 2001 and which again was noted on her repeat catheterization by me in 2009.  We have been aggressively treating her lipids in attempt to induce plaque regression and Judith Stevens is tolerated Lipitor 80 mg in addition to Zetia 10 mg. her last laboratory studies in 2016 were excellent with an LDL of 53, which hopefully is inducing plaque regression continued stability.  Her blood pressure today is controlled on Cardizem CD 240 mg.  Judith Stevens has GERD but this is controlled with Protonix.  Judith Stevens had experienced shortness of breath with activity.  I again reviewed 2016 echo Doppler findings. Judith Stevens had normal systolic function without regional wall motion abnormalities and there was no mention of diastolic dysfunction.  There is evidence for aortic valve sclerosis but mobility was not restricted and there was no evidence for aortic stenosis.  I suspect some of her dyspnea is due to her lung disease.  Judith Stevens has a history of asthma, but presently is not wheezing on her current treatment regimen consisting of Advair, when necessary albuterol, and Brethine.  Judith Stevens is scheduled for a follow-up echo Doppler study next week and I will contact her regarding the results.  Judith Stevens states that Judith Stevens eats healthy.  Judith Stevens has been drinking caffeine free coffee.  Judith Stevens is not having any anginal symptoms.  There are no palpitations.  Repeat fasting laboratory will be obtained.  I will contact her regarding the results and adjustments to her medical therapy are necessary.  As long as Judith Stevens remains stable, I will see her in 6 months for reevaluation.    Time spent: 25 minutes  Troy Sine, MD, [facility]  04/22/2016 1:13 PM

## 2016-04-27 LAB — CBC
HEMATOCRIT: 36.2 % (ref 35.0–45.0)
HEMOGLOBIN: 11.7 g/dL (ref 11.7–15.5)
MCH: 30.2 pg (ref 27.0–33.0)
MCHC: 32.3 g/dL (ref 32.0–36.0)
MCV: 93.5 fL (ref 80.0–100.0)
MPV: 12.7 fL — ABNORMAL HIGH (ref 7.5–12.5)
Platelets: 196 10*3/uL (ref 140–400)
RBC: 3.87 MIL/uL (ref 3.80–5.10)
RDW: 14.1 % (ref 11.0–15.0)
WBC: 6.6 10*3/uL (ref 3.8–10.8)

## 2016-04-27 LAB — LIPID PANEL
CHOL/HDL RATIO: 2 ratio (ref <5.0)
Cholesterol: 155 mg/dL (ref <200)
HDL: 79 mg/dL (ref >50)
LDL Cholesterol: 66 mg/dL (ref <100)
Triglycerides: 50 mg/dL (ref <150)
VLDL: 10 mg/dL (ref <30)

## 2016-04-27 LAB — COMPREHENSIVE METABOLIC PANEL
ALBUMIN: 4.1 g/dL (ref 3.6–5.1)
ALK PHOS: 71 U/L (ref 33–130)
ALT: 20 U/L (ref 6–29)
AST: 18 U/L (ref 10–35)
BILIRUBIN TOTAL: 0.3 mg/dL (ref 0.2–1.2)
BUN: 13 mg/dL (ref 7–25)
CALCIUM: 9 mg/dL (ref 8.6–10.4)
CO2: 31 mmol/L (ref 20–31)
Chloride: 105 mmol/L (ref 98–110)
Creat: 0.52 mg/dL — ABNORMAL LOW (ref 0.60–0.93)
Glucose, Bld: 98 mg/dL (ref 65–99)
POTASSIUM: 5.1 mmol/L (ref 3.5–5.3)
Sodium: 141 mmol/L (ref 135–146)
Total Protein: 6.3 g/dL (ref 6.1–8.1)

## 2016-04-29 ENCOUNTER — Ambulatory Visit (HOSPITAL_COMMUNITY): Payer: Medicare Other | Attending: Cardiovascular Disease

## 2016-04-29 ENCOUNTER — Other Ambulatory Visit: Payer: Self-pay

## 2016-04-29 DIAGNOSIS — I351 Nonrheumatic aortic (valve) insufficiency: Secondary | ICD-10-CM | POA: Insufficient documentation

## 2016-04-29 DIAGNOSIS — R0602 Shortness of breath: Secondary | ICD-10-CM

## 2016-04-30 ENCOUNTER — Encounter: Payer: Self-pay | Admitting: *Deleted

## 2016-06-01 ENCOUNTER — Ambulatory Visit: Payer: Medicare Other | Admitting: Cardiovascular Disease

## 2016-06-09 ENCOUNTER — Ambulatory Visit: Payer: Medicare Other | Admitting: Allergy and Immunology

## 2016-08-18 ENCOUNTER — Ambulatory Visit (INDEPENDENT_AMBULATORY_CARE_PROVIDER_SITE_OTHER): Payer: Medicare Other | Admitting: Allergy and Immunology

## 2016-08-18 ENCOUNTER — Encounter: Payer: Self-pay | Admitting: Allergy and Immunology

## 2016-08-18 ENCOUNTER — Encounter (INDEPENDENT_AMBULATORY_CARE_PROVIDER_SITE_OTHER): Payer: Self-pay

## 2016-08-18 VITALS — BP 124/72 | HR 75 | Temp 97.9°F | Resp 18 | Ht 62.5 in | Wt 139.8 lb

## 2016-08-18 DIAGNOSIS — J449 Chronic obstructive pulmonary disease, unspecified: Secondary | ICD-10-CM | POA: Diagnosis not present

## 2016-08-18 DIAGNOSIS — T485X1A Poisoning by other anti-common-cold drugs, accidental (unintentional), initial encounter: Secondary | ICD-10-CM

## 2016-08-18 DIAGNOSIS — J3089 Other allergic rhinitis: Secondary | ICD-10-CM | POA: Diagnosis not present

## 2016-08-18 DIAGNOSIS — J310 Chronic rhinitis: Secondary | ICD-10-CM

## 2016-08-18 DIAGNOSIS — I10 Essential (primary) hypertension: Secondary | ICD-10-CM

## 2016-08-18 DIAGNOSIS — I2510 Atherosclerotic heart disease of native coronary artery without angina pectoris: Secondary | ICD-10-CM | POA: Diagnosis not present

## 2016-08-18 DIAGNOSIS — T485X5A Adverse effect of other anti-common-cold drugs, initial encounter: Secondary | ICD-10-CM | POA: Insufficient documentation

## 2016-08-18 DIAGNOSIS — Z91018 Allergy to other foods: Secondary | ICD-10-CM

## 2016-08-18 MED ORDER — BUDESONIDE 0.5 MG/2ML IN SUSP: RESPIRATORY_TRACT | 3 refills | Status: DC

## 2016-08-18 NOTE — Assessment & Plan Note (Addendum)
   Continue Advair 250/50 g, one inhalation twice a day, and albuterol HFA, 1-2 inhalations every 6 hours as needed.

## 2016-08-18 NOTE — Progress Notes (Signed)
New Patient Note  RE: Judith Stevens MRN: [id number] DOB: 09-10-1940 Date of Office Visit: 08/18/2016  Referring provider: Richmond Campbell., PA-C Primary care provider: Richmond Campbell., PA-C  Chief Complaint: Allergies (food) and Nasal Congestion   History of present illness: Judith Stevens is a 76 y.o. female seen today in consultation requested by Mady Gemma, PA-C.  She has a few issues that she would like to address today.  She complains of persistent nasal congestion, sneezing, and occasional sinus pressure between the eyes.  She takes over-the-counter oxymetazoline on a regular basis and states that this is dealing medication that has the ability to provide relief.  She takes fexofenadine/pseudoephedrine as well in an attempt to relieve nasal/sinus congestion. No significant seasonal symptom variation has been noted nor have specific environmental triggers been identified with the exception of cat.  In fact, she has requested that she not be skin tested to cat hair antigen because of the anticipated large reaction. She would like be skin tested to specific foods which she believes causes her to experience mild dyspnea.  These foods include tomatos, spices, paprika, nutmeg, ground cloves, cinnamon, red dye 40, chocoate, cow's milk, soy, peanuts, tree nuts, and egg yolk.  She has apparently had food allergen skin testing in the past which were all negative.  She reports that at times, if she eats after 7 PM she will experience mild dyspnea and abdominal bloating.  She states that Protonix prevent heartburn. She is a former cigarette smoker, having quit in 2015.  She reports that her COPD is well controlled with Advair 250/50 g, one inhalation twice a day.   Assessment and plan: Other allergic rhinitis  Aeroallergen avoidance measures have been discussed and provided in written form.  Start budesonide/saline nasal irrigation twice a day.  A prescription has been  provided for budesonide 0.5 mg respules and instructions for mixing and adminstering the rinse have been discussed and provided in written form.  For thick post nasal drainage, add guaifenesin 600 mg (Mucinex)  twice daily as needed with adequate hydration as discussed.  Hypertension  Due to hypertension, I have discouraged the use of decongestants such as pseudoephedrine and phenylephrine.  Continue antihypertensives as prescribed and follow up with primary care physician and/or cardiologist as directed.  History of food allergy Food allergen skin tests were negative today despite a positive histamine control.  The negative predictive value for food allergen skin testing is excellent, however there is a 5% chance that the allergy exists.  A laboratory order form has been provided for serum specific IgE against tomato, paprika, nutmeg, cloves, cinnamon, chocolate, soybean, tree nut panel, egg and egg components, cow's milk and milk components, peanuts, and peanut components, red dye 40, galactose-alpha-1,3-galactose (alpha gal)  panel, as well as a baseline serum tryptase level.  She will be contacted when laboratory results have returned, until then she is to continue avoidance of the foods which caused symptoms in the past.  Rhinitis medicamentosa  Prednisone has been provided, 20 mg x 4 days, 10 mg x1 day, then stop.  Discontinue oxymetazoline.  Budesonide/saline rinse (as above).  Once her nasal symptoms have improved, we will see about switching her from steroid saline rinse to nasal steroid spray.  COPD (chronic obstructive pulmonary disease) (HCC)  Continue Advair 250/50 g, one inhalation twice a day, and albuterol HFA, 1-2 inhalations every 6 hours as needed.   Meds ordered this encounter  Medications  . budesonide (PULMICORT) 0.5 MG/2ML nebulizer  solution    Sig: Use as directed for nasal rinse    Dispense:  120 mL    Refill:  3    Diagnostics: Spirometry: FVC was  2.26 L (85% predicted) season FEV1 was 1.53 L (75% predicted) without post bronchodilator improvement.  Mild airways obstruction without reversibility.  Please see scanned spirometry results for details. Epicutaneous testing: Positive to red cedar pollen and mixed feathers. Intradermal testing: Positive to perennial mold mix 4. Food allergen skin testing:  Negative despite a positive histamine control.    Physical examination: Blood pressure 124/72, pulse 75, temperature 97.9 F (36.6 C), temperature source Oral, resp. rate 18, height 5' 2.5" (1.588 m), weight 139 lb 12.8 oz (63.4 kg), SpO2 97 %.  General: Alert, interactive, in no acute distress. HEENT: TMs pearly gray, turbinates edematous with thick discharge, post-pharynx mildly erythematous. Neck: Supple without lymphadenopathy. Lungs: Clear to auscultation without wheezing, rhonchi or rales. CV: Normal S1, S2 without murmurs. Abdomen: Nondistended, nontender. Skin: Warm and dry, without lesions or rashes. Extremities:  No clubbing, cyanosis or edema. Neuro:   Grossly intact.  Review of systems:  Review of systems negative except as noted in HPI / PMHx or noted below: Review of Systems  Constitutional: Negative.   HENT: Negative.   Eyes: Negative.   Respiratory: Negative.   Cardiovascular: Negative.   Gastrointestinal: Negative.   Genitourinary: Negative.   Musculoskeletal: Negative.   Skin: Negative.   Neurological: Negative.   Endo/Heme/Allergies: Negative.   Psychiatric/Behavioral: Negative.     Past medical history:  Past Medical History:  Diagnosis Date  . Anxiety   . Arthritis   . Asthma   . CAD (coronary artery disease)    single vessel  . GERD (gastroesophageal reflux disease)   . Headache   . History of stress test 10/2011   Resting images reveal a normal pattern of perfusion in all regions. The post stress myocardial perfusion images show a normal pattern of perfusion in all region. The post stress left  ventricle is normal size. the rest left ventricle is normal size, there is no scintigraphic evidence of inductible myocardial ischemia.  Marland Kitchen Hx of echocardiogram 09/2009   EF >55% Normal Echo  . Hyperlipidemia   . Hypertension   . Migraines   . Pneumonia    hx of  . Pulmonary emboli (HCC) 5/12  . Shortness of breath dyspnea    exertion or eating something shes allergic too  . Tobacco abuse     Past surgical history:  Past Surgical History:  Procedure Laterality Date  . ADENOIDECTOMY    . BACK SURGERY  2012  . CARDIAC CATHETERIZATION  10/13/2007   60% narrowing twin LAD system  . FOOT SURGERY  2002,2013   right  . KNEE SURGERY  01/17/2007   right  . SACROILIAC JOINT FUSION Right 03/28/2014   Procedure: SACROILIAC JOINT FUSION;  Surgeon: Emilee Hero, MD;  Location: [facility] OR;  Service: Orthopedics;  Laterality: Right;  Right sided sacroliac joint fusion  . TONSILLECTOMY      Family history: Family History  Problem Relation Age of Onset  . Heart attack Mother   . Diabetes Mother   . Asthma Mother   . Heart attack Father   . Diabetes Sister   . Hyperlipidemia Sister   . Allergic rhinitis Neg Hx   . Angioedema Neg Hx   . Eczema Neg Hx   . Immunodeficiency Neg Hx   . Urticaria Neg Hx     Social  history: Social History   Social History  . Marital status: Widowed    Spouse name: N/A  . Number of children: N/A  . Years of education: N/A   Occupational History  . Not on file.   Social History Main Topics  . Smoking status: Former Smoker    Packs/day: 0.00    Types: Cigarettes    Start date: 01/30/2014  . Smokeless tobacco: Former Neurosurgeon    Quit date: 01/29/2014  . Alcohol use 2.4 oz/week    2 Glasses of wine, 2 Cans of beer per week  . Drug use: No  . Sexual activity: Not on file   Other Topics Concern  . Not on file   Social History Narrative  . No narrative on file   Environmental History: The patient lives in a 74 year old house with hardwood floors  throughout and central air/heat.  There is a dog in the home until recently.  She is a former cigarette smoker.  There is no known mold/water damage in the home.  Allergies as of 08/18/2016      Reactions   Cefzil [cefprozil] Shortness Of Breath   Keflex [cephalexin] Shortness Of Breath, Rash   Lactose Intolerance (gi) Shortness Of Breath   ALL DAIRY PRODUCTS   Levaquin [levofloxacin] Shortness Of Breath, Rash   Naproxen Shortness Of Breath   Shortness of breath Shortness of breath   Oxycodone-acetaminophen Nausea And Vomiting, Shortness Of Breath   Peanuts [peanut Oil] Shortness Of Breath   Penicillins Shortness Of Breath   Percocet [oxycodone-acetaminophen] Shortness Of Breath   Red Dye Shortness Of Breath   Sulfa Antibiotics Shortness Of Breath   Sulfacetamide Sodium Shortness Of Breath   Tetracycline Hcl Shortness Of Breath   Tetracyclines & Related Shortness Of Breath   Methocarbamol Nausea And Vomiting   Tomato Other (See Comments)   SOB   Amoxicillin-pot Clavulanate Rash   Ibuprofen Rash   Moxifloxacin Rash   Penicillin G Rash   Sulfamethoxazole-trimethoprim Rash   Tetracycline Rash   Tramadol Rash      Medication List       Accurate as of 08/18/16 12:56 PM. Always use your most recent med list.          ADVAIR DISKUS 250-50 MCG/DOSE Aepb Generic drug:  Fluticasone-Salmeterol Inhale 1 puff into the lungs 2 (two) times daily.   ALPRAZolam 0.5 MG tablet Commonly known as:  XANAX Take 0.5 mg by mouth at bedtime as needed for anxiety.   atorvastatin 80 MG tablet Commonly known as:  LIPITOR Take 1 tablet (80 mg total) by mouth daily.   budesonide 0.5 MG/2ML nebulizer solution Commonly known as:  PULMICORT Use as directed for nasal rinse   butalbital-acetaminophen-caffeine 50-325-40-30 MG capsule Commonly known as:  FIORICET WITH CODEINE Take 1 capsule by mouth daily as needed for headache.   clindamycin 300 MG capsule Commonly known as:  CLEOCIN Take 300  mg by mouth 3 (three) times daily.   diltiazem 240 MG 24 hr capsule Commonly known as:  CARDIZEM CD Take 1 capsule (240 mg total) by mouth at bedtime.   escitalopram 20 MG tablet Commonly known as:  LEXAPRO Take 1 tablet (20 mg total) by mouth daily.   ezetimibe 10 MG tablet Commonly known as:  ZETIA Take 1 tablet (10 mg total) by mouth daily.   MULTI-VITAMIN DAILY PO Take 1 tablet by mouth daily.   nitroGLYCERIN 0.4 MG SL tablet Commonly known as:  NITROSTAT Place 1 tablet (0.4 mg total)  under the tongue every 5 (five) minutes as needed for chest pain. Needs appointment for future refills   ondansetron 4 MG tablet Commonly known as:  ZOFRAN Take 4 mg by mouth every 8 (eight) hours as needed for nausea.   pantoprazole 40 MG tablet Commonly known as:  PROTONIX Take 80 mg by mouth 2 (two) times daily.   terbutaline 5 MG tablet Commonly known as:  BRETHINE Take 5 mg by mouth every 6 (six) hours as needed (asthma).   VENTOLIN HFA 108 (90 Base) MCG/ACT inhaler Generic drug:  albuterol INHALE 2 PUFFS INTO THE LUNGS EVERY 6 (SIX) HOURS AS NEEDED FOR WHEEZING.   Vitamin D3 2000 units Tabs Take 2,000 Units by mouth daily.       Known medication allergies: Allergies  Allergen Reactions  . Cefzil [Cefprozil] Shortness Of Breath  . Keflex [Cephalexin] Shortness Of Breath and Rash  . Lactose Intolerance (Gi) Shortness Of Breath    ALL DAIRY PRODUCTS  . Levaquin [Levofloxacin] Shortness Of Breath and Rash  . Naproxen Shortness Of Breath    Shortness of breath Shortness of breath  . Oxycodone-Acetaminophen Nausea And Vomiting and Shortness Of Breath  . Peanuts [Peanut Oil] Shortness Of Breath  . Penicillins Shortness Of Breath  . Percocet [Oxycodone-Acetaminophen] Shortness Of Breath  . Red Dye Shortness Of Breath  . Sulfa Antibiotics Shortness Of Breath  . Sulfacetamide Sodium Shortness Of Breath  . Tetracycline Hcl Shortness Of Breath  . Tetracyclines & Related  Shortness Of Breath  . Methocarbamol Nausea And Vomiting  . Tomato Other (See Comments)    SOB  . Amoxicillin-Pot Clavulanate Rash  . Ibuprofen Rash  . Moxifloxacin Rash  . Penicillin G Rash  . Sulfamethoxazole-Trimethoprim Rash  . Tetracycline Rash  . Tramadol Rash    I appreciate the opportunity to take part in Judith Stevens's care. Please do not hesitate to contact me with questions.  Sincerely,   R. Jorene Guestarter Keyonna Comunale, MD

## 2016-08-18 NOTE — Assessment & Plan Note (Addendum)
   Prednisone has been provided, 20 mg x 4 days, 10 mg x1 day, then stop.  Discontinue oxymetazoline.  Budesonide/saline rinse (as above).  Once her nasal symptoms have improved, we will see about switching her from steroid saline rinse to nasal steroid spray.

## 2016-08-18 NOTE — Assessment & Plan Note (Signed)
   Aeroallergen avoidance measures have been discussed and provided in written form.  Start budesonide/saline nasal irrigation twice a day.  A prescription has been provided for budesonide 0.5 mg respules and instructions for mixing and adminstering the rinse have been discussed and provided in written form.  For thick post nasal drainage, add guaifenesin 600 mg (Mucinex)  twice daily as needed with adequate hydration as discussed.

## 2016-08-18 NOTE — Assessment & Plan Note (Addendum)
Food allergen skin tests were negative today despite a positive histamine control.  The negative predictive value for food allergen skin testing is excellent, however there is a 5% chance that the allergy exists.  A laboratory order form has been provided for serum specific IgE against tomato, paprika, nutmeg, cloves, cinnamon, chocolate, soybean, tree nut panel, egg and egg components, cow's milk and milk components, peanuts, and peanut components, red dye 40, galactose-alpha-1,3-galactose (alpha gal)  panel, as well as a baseline serum tryptase level.  She will be contacted when laboratory results have returned, until then she is to continue avoidance of the foods which caused symptoms in the past.

## 2016-08-18 NOTE — Assessment & Plan Note (Addendum)
   Due to hypertension, I have discouraged the use of decongestants such as pseudoephedrine and phenylephrine.  Continue antihypertensives as prescribed and follow up with primary care physician and/or cardiologist as directed.

## 2016-08-18 NOTE — Patient Instructions (Addendum)
Other allergic rhinitis  Aeroallergen avoidance measures have been discussed and provided in written form.  Start budesonide/saline nasal irrigation twice a day.  A prescription has been provided for budesonide 0.5 mg respules and instructions for mixing and adminstering the rinse have been discussed and provided in written form.  For thick post nasal drainage, add guaifenesin 600 mg (Mucinex)  twice daily as needed with adequate hydration as discussed.  Hypertension  Due to hypertension, I have discouraged the use of decongestants such as pseudoephedrine and phenylephrine.  Continue antihypertensives as prescribed and follow up with primary care physician and/or cardiologist as directed.  History of food allergy Food allergen skin tests were negative today despite a positive histamine control.  The negative predictive value for food allergen skin testing is excellent, however there is a 5% chance that the allergy exists.  A laboratory order form has been provided for serum specific IgE against tomato, paprika, nutmeg, cloves, cinnamon, chocolate, soybean, tree nut panel, egg and egg components, cow's milk and milk components, peanuts, and peanut components, red dye 40, galactose-alpha-1,3-galactose (alpha gal)  panel, as well as a baseline serum tryptase level.  She will be contacted when laboratory results have returned, until then she is to continue avoidance of the foods which caused symptoms in the past.  Rhinitis medicamentosa  Prednisone has been provided, 20 mg x 4 days, 10 mg x1 day, then stop.  Discontinue oxymetazoline.  Budesonide/saline rinse (as above).  Once her nasal symptoms have improved, we will see about switching her from steroid saline rinse to nasal steroid spray.  COPD (chronic obstructive pulmonary disease) (HCC)  Continue Advair 250/50 g, one inhalation twice a day, and albuterol HFA, 1-2 inhalations every 6 hours as needed.   When lab results have  returned the patient will be called with further recommendations and follow up instructions.   Reducing Pollen Exposure  The American Academy of Allergy, Asthma and Immunology suggests the following steps to reduce your exposure to pollen during allergy seasons.    1. Do not hang sheets or clothing out to dry; pollen may collect on these items. 2. Do not mow lawns or spend time around freshly cut grass; mowing stirs up pollen. 3. Keep windows closed at night.  Keep car windows closed while driving. 4. Minimize morning activities outdoors, a time when pollen counts are usually at their highest. 5. Stay indoors as much as possible when pollen counts or humidity is high and on windy days when pollen tends to remain in the air longer. 6. Use air conditioning when possible.  Many air conditioners have filters that trap the pollen spores. 7. Use a HEPA room air filter to remove pollen form the indoor air you breathe.   Control of Mold Allergen  Mold and fungi can grow on a variety of surfaces provided certain temperature and moisture conditions exist.  Outdoor molds grow on plants, decaying vegetation and soil.  The major outdoor mold, Alternaria and Cladosporium, are found in very high numbers during hot and dry conditions.  Generally, a late Summer - Fall peak is seen for common outdoor fungal spores.  Rain will temporarily lower outdoor mold spore count, but counts rise rapidly when the rainy period ends.  The most important indoor molds are Aspergillus and Penicillium.  Dark, humid and poorly ventilated basements are ideal sites for mold growth.  The next most common sites of mold growth are the bathroom and the kitchen.  Outdoor MicrosoftMold Control 1. Use air conditioning and  keep windows closed 2. Avoid exposure to decaying vegetation. 3. Avoid leaf raking. 4. Avoid grain handling. 5. Consider wearing a face mask if working in moldy areas.  Indoor Mold Control 1. Maintain humidity below  50%. 2. Clean washable surfaces with 5% bleach solution. 3. Remove sources e.g. Contaminated carpets.

## 2016-08-21 LAB — ALLERGENS(7)
Brazil Nut IgE: <0.1 kU/L
F020-IgE Almond: <0.1 kU/L
F202-IgE Cashew Nut: <0.1 kU/L
Hazelnut (Filbert) IgE: <0.1 kU/L
Peanut IgE: <0.1 kU/L
Pecan Nut IgE: <0.1 kU/L
Walnut IgE: <0.1 kU/L

## 2016-08-21 LAB — ALLERGEN, CINNAMON, RF220: Allergen Cinnamon IgE: <0.1 kU/L

## 2016-08-21 LAB — ALPHA-GAL PANEL
Alpha Gal IgE*: <0.1 kU/L (ref <0.35)
Beef (Bos spp) IgE: <0.1 kU/L (ref <0.35)
Class Interpretation: 0
Class Interpretation: 0
Class Interpretation: 0
Lamb/Mutton (Ovis spp) IgE: <0.1 kU/L (ref <0.35)
Pork (Sus spp) IgE: <0.1 kU/L (ref <0.35)

## 2016-08-21 LAB — F245-IGE EGG, WHOLE: Egg, Whole IgE: <0.1 kU/L

## 2016-08-21 LAB — SOYBEAN IGE: Soybean IgE: <0.1 kU/L

## 2016-08-21 LAB — IGE MILK COMPONENT PROFILE
F076-IgE Alpha Lactalbumin: <0.1 kU/L
F077-IgE Beta Lactoglobulin: <0.1 kU/L
F078-IgE Casein: <0.1 kU/L

## 2016-08-21 LAB — IGE PEANUT COMPONENT PROFILE
F352-IgE Ara h 8: <0.1 kU/L
F422-IgE Ara h 1: <0.1 kU/L
F423-IgE Ara h 2: <0.1 kU/L
F424-IgE Ara h 3: <0.1 kU/L
F427-IgE Ara h 9: <0.1 kU/L

## 2016-08-21 LAB — F282-IGE NUTMEG: F282-IgE Nutmeg: <0.1 kU/L

## 2016-08-21 LAB — CHOCOLATE/CACAO IGE: Chocolate/Cacao IgE: <0.1 kU/L

## 2016-08-21 LAB — MILK IGE: Milk IgE: <0.1 kU/L

## 2016-08-21 LAB — IGE EGG WHITE COMPONENT PROF
F232-IgE Ovalbumin: <0.1 kU/L
F233-IgE Ovomucoid: <0.1 kU/L

## 2016-08-21 LAB — F268-IGE CLOVES: F268-IgE Cloves: <0.1 kU/L

## 2016-08-21 LAB — F218-IGE PAPRIKA: Paprika IgE: <0.1 kU/L

## 2016-08-21 LAB — TOMATO IGE: Allergen Tomato, IgE: <0.1 kU/L

## 2016-08-21 LAB — F340-IGE CARMINE RED DYE: F340-IgE Carmine Red Dye: <0.1 kU/L

## 2016-08-25 ENCOUNTER — Telehealth: Payer: Self-pay | Admitting: Allergy and Immunology

## 2016-08-25 NOTE — Telephone Encounter (Signed)
Call and explained results to pt and she still is not sure why she is having issues with shortness of breath when eating certain foods. She would like for you, Dr Nunzio CobbsBobbitt to give her a call back at your earliest convince.

## 2016-08-25 NOTE — Telephone Encounter (Signed)
Patient would like test results. She looked on my chart, but said she could not tell if she was allergic or not.

## 2016-08-25 NOTE — Telephone Encounter (Signed)
I spoke with the patient and answered all of her questions. She will be contacting her PCP about getting a referral to a GI doc as she has been on twice daily Protonix for a long period of time and is uncertain if she is having silent reflux contributing to symptoms after certain foods.

## 2016-09-24 ENCOUNTER — Ambulatory Visit (INDEPENDENT_AMBULATORY_CARE_PROVIDER_SITE_OTHER): Payer: Medicare Other | Admitting: Cardiovascular Disease

## 2016-09-24 ENCOUNTER — Encounter: Payer: Self-pay | Admitting: Cardiovascular Disease

## 2016-09-24 VITALS — BP 102/64 | HR 57 | Ht 62.5 in | Wt 144.6 lb

## 2016-09-24 DIAGNOSIS — R0609 Other forms of dyspnea: Secondary | ICD-10-CM | POA: Diagnosis not present

## 2016-09-24 DIAGNOSIS — I358 Other nonrheumatic aortic valve disorders: Secondary | ICD-10-CM

## 2016-09-24 DIAGNOSIS — Z87891 Personal history of nicotine dependence: Secondary | ICD-10-CM

## 2016-09-24 DIAGNOSIS — I2583 Coronary atherosclerosis due to lipid rich plaque: Secondary | ICD-10-CM | POA: Diagnosis not present

## 2016-09-24 DIAGNOSIS — R0602 Shortness of breath: Secondary | ICD-10-CM

## 2016-09-24 DIAGNOSIS — I2510 Atherosclerotic heart disease of native coronary artery without angina pectoris: Secondary | ICD-10-CM | POA: Diagnosis not present

## 2016-09-24 DIAGNOSIS — E785 Hyperlipidemia, unspecified: Secondary | ICD-10-CM

## 2016-09-24 DIAGNOSIS — I10 Essential (primary) hypertension: Secondary | ICD-10-CM | POA: Diagnosis not present

## 2016-09-24 NOTE — Progress Notes (Signed)
Patient ID: Judith Stevens, female   DOB: May 01, 1940, 76 y.o.   MRN: [id number]     HPI: Judith Stevens is a 76 y.o. female who presents for a 5 month cardiology evaluation.  [name] has CAD and in 2001 underwent cardiac catheterization while in [place] which showed a 60% ostial narrowing of her LAD. Repeat catheterization by me in 2009 did not show any significant interval change. Unfortunately, she has continued her ongoing tobacco habit but is now smoking significantly reduced at less than one quarter pack per day.  She has a history of hypertension as well as hyperlipidemia.  A nuclear perfusion study in August 2013 which was unchanged from 2 years previously and continued to show normal perfusion.   In December 2015 prior to undergoing right hip surgery a 2 year follow-up nuclear perfusion study on 03/13/2014 was performed preoperatively.  This continued to show normal perfusion without scar or ischemia.  She tolerated her hip surgery well.  She had smoked for 50 years before finally quitting smoking when I had seen her last.  When I last saw he 2 months ago, she had complained of experiencing increasing shortness of breath of 6 weeks duration that was not consistently exertionally precipitated.   She admitted to fatigue.  She is unaware of palpitations.  She has hyperlipidemia and has been taking atorvastatin 80 mg and Zetia 10 mg in attempt to continue to induce plaque regression.    An echo Doppler study on 02/18/2015 showed an EF of 55-60%, moderate thickening and calcification of a trileaflet aortic valve, consistent with sclerosis but without stenosis and mitral annular calcification.  Laboratory revealed excellent lipid studies with a total cholesterol 149, triglycerides 55, HDL 85 and her LDL was now 53.  Thyroid function studies were normal.  Her renal function was excellent.  LFTs were normal.  Vitamin D was normal at 41 and her vitamin B-12 level was normal at 448.    She  was seen by [name] in October 2017 with complaints of dyspnea on exertion.  She has a history of asthma as well as numerous allergies to foods and some drugs.  She tells me she had the flu in early January 2018.  When I last saw her in January 2018.  She had stop smoking.  She did have complaints of dyspnea.  I recommended a follow-up echo Doppler study which was done on 04/29/2016.  This showed normal LV function and normal diastolic parameters.  The aortic valve was mildly calcified and was without stenosis.  There was mild aortic insufficiency.  Her mitral valve annulus was calcified and leaflets were mildly thickened.  Her left atrium was mildly dilated.  Unfortunately, on 08-27-22 her dog had died.  She was very stressed and since that time, had resumed smoking.  She will be going out of town tomorrow or for sister who has significant vascular issues and it seems may be undergoing an aortic stent graft.  Past Medical History:  Diagnosis Date  . Anxiety   . Arthritis   . Asthma   . CAD (coronary artery disease)    single vessel  . GERD (gastroesophageal reflux disease)   . Headache   . History of stress test 10/2011   Resting images reveal a normal pattern of perfusion in all regions. The post stress myocardial perfusion images show a normal pattern of perfusion in all region. The post stress left ventricle is normal size. the rest left ventricle is normal  size, there is no scintigraphic evidence of inductible myocardial ischemia.  Marland Kitchen Hx of echocardiogram 09/2009   EF >55% Normal Echo  . Hyperlipidemia   . Hypertension   . Migraines   . Pneumonia    hx of  . Pulmonary emboli ([place]) 5/12  . Shortness of breath dyspnea    exertion or eating something shes allergic too  . Tobacco abuse     Past Surgical History:  Procedure Laterality Date  . ADENOIDECTOMY    . BACK SURGERY  2012  . CARDIAC CATHETERIZATION  10/13/2007   60% narrowing twin LAD system  . FOOT SURGERY  2002,2013    right  . KNEE SURGERY  01/17/2007   right  . SACROILIAC JOINT FUSION Right 03/28/2014   Procedure: SACROILIAC JOINT FUSION;  Surgeon: Sinclair Ship, MD;  Location: [facility];  Service: Orthopedics;  Laterality: Right;  Right sided sacroliac joint fusion  . TONSILLECTOMY      Allergies  Allergen Reactions  . Cefzil [Cefprozil] Shortness Of Breath  . Keflex [Cephalexin] Shortness Of Breath and Rash  . Lactose Intolerance (Gi) Shortness Of Breath    ALL DAIRY PRODUCTS  . Levaquin [Levofloxacin] Shortness Of Breath and Rash  . Naproxen Shortness Of Breath    Shortness of breath Shortness of breath  . Oxycodone-Acetaminophen Nausea And Vomiting and Shortness Of Breath  . Peanuts [Peanut Oil] Shortness Of Breath  . Penicillins Shortness Of Breath  . Percocet [Oxycodone-Acetaminophen] Shortness Of Breath  . Red Dye Shortness Of Breath  . Sulfa Antibiotics Shortness Of Breath  . Sulfacetamide Sodium Shortness Of Breath  . Tetracycline Hcl Shortness Of Breath  . Tetracyclines & Related Shortness Of Breath  . Methocarbamol Nausea And Vomiting  . Tomato Other (See Comments)    SOB  . Amoxicillin-Pot Clavulanate Rash  . Ibuprofen Rash  . Moxifloxacin Rash  . Penicillin G Rash  . Sulfamethoxazole-Trimethoprim Rash  . Tetracycline Rash  . Tramadol Rash    Current Outpatient Prescriptions  Medication Sig Dispense Refill  . ALPRAZolam (XANAX) 0.5 MG tablet Take 0.5 mg by mouth at bedtime as needed for anxiety.    Marland Kitchen atorvastatin (LIPITOR) 80 MG tablet Take 1 tablet (80 mg total) by mouth daily. 90 tablet 3  . budesonide (PULMICORT) 0.5 MG/2ML nebulizer solution Use as directed for nasal rinse 120 mL 3  . butalbital-acetaminophen-caffeine (FIORICET WITH CODEINE) 50-325-40-30 MG per capsule Take 1 capsule by mouth daily as needed for headache.   5  . Cholecalciferol (VITAMIN D3) 2000 UNITS TABS Take 2,000 Units by mouth daily.     . clindamycin (CLEOCIN) 300 MG capsule Take 300 mg by  mouth 3 (three) times daily.  0  . diltiazem (CARDIZEM CD) 240 MG 24 hr capsule Take 1 capsule (240 mg total) by mouth at bedtime. 90 capsule 3  . escitalopram (LEXAPRO) 20 MG tablet Take 1 tablet (20 mg total) by mouth daily. 90 tablet 0  . ezetimibe (ZETIA) 10 MG tablet Take 1 tablet (10 mg total) by mouth daily. 90 tablet 3  . Fluticasone-Salmeterol (ADVAIR DISKUS) 250-50 MCG/DOSE AEPB Inhale 1 puff into the lungs 2 (two) times daily.    . Multiple Vitamin (MULTI-VITAMIN DAILY PO) Take 1 tablet by mouth daily.    . nitroGLYCERIN (NITROSTAT) 0.4 MG SL tablet Place 1 tablet (0.4 mg total) under the tongue every 5 (five) minutes as needed for chest pain. Needs appointment for future refills 25 tablet 0  . ondansetron (ZOFRAN) 4 MG tablet Take  4 mg by mouth every 8 (eight) hours as needed for nausea.    . pantoprazole (PROTONIX) 40 MG tablet Take 80 mg by mouth 2 (two) times daily.     . terbutaline (BRETHINE) 5 MG tablet Take 5 mg by mouth every 6 (six) hours as needed (asthma).     . VENTOLIN HFA 108 (90 Base) MCG/ACT inhaler INHALE 2 PUFFS INTO THE LUNGS EVERY 6 (SIX) HOURS AS NEEDED FOR WHEEZING.  11   No current facility-administered medications for this visit.     Social History   Social History  . Marital status: Widowed    Spouse name: N/A  . Number of children: N/A  . Years of education: N/A   Occupational History  . Not on file.   Social History Main Topics  . Smoking status: Former Smoker    Packs/day: 0.00    Types: Cigarettes    Start date: 01/30/2014  . Smokeless tobacco: Former Systems developer    Quit date: 01/29/2014  . Alcohol use 2.4 oz/week    2 Glasses of wine, 2 Cans of beer per week  . Drug use: No  . Sexual activity: Not on file   Other Topics Concern  . Not on file   Social History Narrative  . No narrative on file    Family History  Problem Relation Age of Onset  . Heart attack Mother   . Diabetes Mother   . Asthma Mother   . Heart attack Father   .  Diabetes Sister   . Hyperlipidemia Sister   . Allergic rhinitis Neg Hx   . Angioedema Neg Hx   . Eczema Neg Hx   . Immunodeficiency Neg Hx   . Urticaria Neg Hx    Socially, she is widowed. She has 2 children 5 grandchildren. She does not exercise. She quit smoking 2 years ago. resumed on Aug 17, 2022 after her dog died.  She is committed to completely discontinue tobacco.   ROS General: Negative; No fevers, chills, or night sweats;  HEENT: Negative; No changes in vision or hearing, sinus congestion, difficulty swallowing Pulmonary: Positive for asthma Cardiovascular: Negative; No chest pain, presyncope, syncope, palpitations GI: positive for GERD; No nausea, vomiting, diarrhea, or abdominal pain GU: Negative; No dysuria, hematuria, or difficulty voiding Musculoskeletal: She tolerated her hip surgery well. Hematologic/Oncology: Negative; no easy bruising, bleeding Endocrine: Negative; no heat/cold intolerance; no diabetes Neuro: Negative; no changes in balance, headaches Skin: Negative; No rashes or skin lesions Psychiatric: Negative; No behavioral problems, depression Sleep: Negative; No snoring, daytime sleepiness, hypersomnolence, bruxism, restless legs, hypnogognic hallucinations, no cataplexy Other comprehensive 14 point system review is negative.  Nutritionally she cannot have dairy products due to significant milk allergy.  PE BP 102/64 (BP Location: Left Arm, Patient Position: Sitting, Cuff Size: Normal)   Pulse (!) 57   Ht 5' 2.5" (1.588 m)   Wt 144 lb 9.6 oz (65.6 kg)   LMP  (LMP Unknown)   BMI 26.03 kg/m    Repeat blood pressure by me 122/64  Wt Readings from Last 3 Encounters:  09/24/16 144 lb 9.6 oz (65.6 kg)  08/18/16 139 lb 12.8 oz (63.4 kg)  04/22/16 139 lb 12.8 oz (63.4 kg)   General: Alert, oriented, no distress.  Skin: normal turgor, no rashes, warm and dry HEENT: Normocephalic, atraumatic. Pupils equal round and reactive to light; sclera anicteric;  extraocular muscles intact;  Nose without nasal septal hypertrophy Mouth/Parynx benign; Mallinpatti scale 3 Neck: No JVD, no carotid bruits;  normal carotid upstroke Lungs: clear to ausculatation and percussion; no wheezing or rales Chest wall: without tenderness to palpitation Heart: PMI not displaced, RRR, s1 s2 normal, 1/6 systolic murmur, no diastolic murmur, no rubs, gallops, thrills, or heaves Abdomen: soft, nontender; no hepatosplenomehaly, BS+; abdominal aorta nontender and not dilated by palpation. Back: no CVA tenderness Pulses 2+ Musculoskeletal: full range of motion, normal strength, no joint deformities Extremities: no clubbing cyanosis or edema, Homan's sign negative  Neurologic: grossly nonfocal; Cranial nerves grossly wnl Psychologic: Normal mood and affect   ECG (independently read by me): Sinus bradycardia 57 bpm.  No significant ST-T changes.  Normal intervals.  January 2018 ECG (independently read by me): Normal sinus rhythm at 69 bpm.  No ectopy.  Normal intervals.  November 2016 ECG (independently read by me): Normal sinus rhythm at 60 bpm.  No significant ST-T changes.  Normal intervals.  November 2015 ECG (independently read by me): Normal sinus rhythm at 72 bpm.  Normal intervals.  No significant ST-T changes.  Prior October 2014 ECG: Sinus rhythm with occasional isolated PVC. Heart rate 68 beats per minute. QTc interval 440 ms.  LAB: BMP Latest Ref Rng & Units 04/27/2016 01/24/2016 02/19/2015  Glucose 65 - 99 mg/dL 98 99 94  BUN 7 - 25 mg/dL _0 Creatinine 0.60 - 0.93 mg/dL 0.52(L) 0.50(L) 0.43(L)  Sodium 135 - 146 mmol/L 141 141 140  Potassium 3.5 - 5.3 mmol/L 5.1 5.0 4.8  Chloride 98 - 110 mmol/L 105 101 102  CO2 20 - 31 mmol/L _1 Calcium 8.6 - 10.4 mg/dL 9.0 9.5 8.7   Hepatic Function Latest Ref Rng & Units 04/27/2016 02/19/2015 03/21/2014  Total Protein 6.1 - 8.1 g/dL 6.3 6.2 5.9(L)  Albumin 3.6 - 5.1 g/dL 4.1 3.7 3.9  AST 10 - 35 U/L _2 ALT 6 - 29 U/L _3 Alk Phosphatase 33 - 130 U/L 71 94 101  Total Bilirubin 0.2 - 1.2 mg/dL 0.3 0.4 0.3   CBC Latest Ref Rng & Units 04/27/2016 02/19/2015 03/21/2014  WBC 3.8 - 10.8 K/uL 6.6 10.0 5.3  Hemoglobin 11.7 - 15.5 g/dL 11.7 11.4(L) 11.7(L)  Hematocrit 35.0 - 45.0 % 36.2 35.8(L) 35.5(L)  Platelets 140 - 400 K/uL 196 242 220   Lab Results  Component Value Date   MCV 93.5 04/27/2016   MCV 91.1 02/19/2015   MCV 92.4 03/21/2014   Lab Results  Component Value Date   TSH 1.650 02/19/2015   No results found for: HGBA1C   Lipid Panel     Component Value Date/Time   CHOL 155 04/27/2016 0923   TRIG 50 04/27/2016 0923   HDL 79 04/27/2016 0923   CHOLHDL 2.0 04/27/2016 0923   VLDL 10 04/27/2016 0923   LDLCALC 66 04/27/2016 0923    RADIOLOGY: No results found.  IMPRESSION:  1. DOE (dyspnea on exertion)   2. Coronary artery disease due to lipid rich plaque   3. Aortic valve sclerosis   4. Essential hypertension   5. Hyperlipidemia LDL goal <70   6. Hx of tobacco use, presenting hazards to health   7. Shortness of breath     ASSESSMENT AND PLAN: Ms. Liles is a 76 year old white female who had smoked for over 50 years and quit smoking 2 years ago.  Unfortunately, she was under significant stress with the recent death of her dog suddenly and resumed smoking for the past month.  She is committed to  completely discontinue this again.  She has CAD with a 60% ostial LAD stenosis dating back to 2001 and which again was noted on her repeat catheterization by me in 2009.  We have been aggressively treating her lipids in attempt to induce plaque regression and she is tolerated Lipitor 80 mg in addition to Zetia 10 mg. her last laboratory studies in 2016 were excellent with an LDL of 53, which hopefully is inducing plaque regression continued stability.  Her blood pressure today initially was 102/64, but on recheck by me was 130/64.  She is on diltiazem 240 mg daily, and  appears to be tolerating this well.  Her GERD is controlled with pantoprazole.  She is on atorvastatin, now at 80 mg for hyperlipidemia.  In addition to Zetia 10 mg and her last laboratory in January 2018 showed her LDL at 20.  I reviewed her echo Doppler study.  There is evidence for aortic sclerosis which is causing her murmur, but there is no stenosis.  She had normal systolic and diastolic parameters with an ejection fraction at 55-60%.  She has a history of asthma and continues to take Advair and Pulmicort.  She is on Lexapro and PRN xanax for depression/anxiety.  I will see her one year for reevaluation Time spent: 25 minutes  Troy Sine, MD, [facility]  09/24/2016 7:21 PM

## 2016-09-24 NOTE — Patient Instructions (Signed)
Your physician wants you to follow-up in: 1 year or sooner if needed. You will receive a reminder letter in the mail two months in advance. If you don't receive a letter, please call our office to schedule the follow-up appointment.   If you need a refill on your cardiac medications before your next appointment, please call your pharmacy.   

## 2017-02-04 ENCOUNTER — Other Ambulatory Visit: Payer: Self-pay | Admitting: Allergy and Immunology

## 2017-02-04 ENCOUNTER — Telehealth: Payer: Self-pay | Admitting: Cardiovascular Disease

## 2017-02-04 DIAGNOSIS — E785 Hyperlipidemia, unspecified: Secondary | ICD-10-CM

## 2017-02-04 DIAGNOSIS — I2510 Atherosclerotic heart disease of native coronary artery without angina pectoris: Secondary | ICD-10-CM

## 2017-02-04 DIAGNOSIS — I358 Other nonrheumatic aortic valve disorders: Secondary | ICD-10-CM

## 2017-02-04 DIAGNOSIS — Z79899 Other long term (current) drug therapy: Secondary | ICD-10-CM

## 2017-02-04 DIAGNOSIS — T485X5A Adverse effect of other anti-common-cold drugs, initial encounter: Secondary | ICD-10-CM

## 2017-02-04 DIAGNOSIS — J3089 Other allergic rhinitis: Secondary | ICD-10-CM

## 2017-02-04 DIAGNOSIS — I10 Essential (primary) hypertension: Secondary | ICD-10-CM

## 2017-02-04 DIAGNOSIS — J310 Chronic rhinitis: Secondary | ICD-10-CM

## 2017-02-04 NOTE — Telephone Encounter (Signed)
Returned call, orders placed for fasting labs.  Patient aware and verbalized understanding.

## 2017-02-04 NOTE — Telephone Encounter (Signed)
New message  Pt call requesting to speak with RN about getting blood work orders put in the system to complete before her appt on 11/13. Please call back to discuss

## 2017-02-04 NOTE — Telephone Encounter (Signed)
Returning your call. °

## 2017-02-04 NOTE — Telephone Encounter (Signed)
Left message to call back  

## 2017-02-09 ENCOUNTER — Encounter: Payer: Self-pay | Admitting: Cardiovascular Disease

## 2017-02-09 ENCOUNTER — Ambulatory Visit (INDEPENDENT_AMBULATORY_CARE_PROVIDER_SITE_OTHER): Payer: Medicare Other | Admitting: Cardiovascular Disease

## 2017-02-09 VITALS — BP 142/62 | HR 67 | Ht 64.0 in | Wt 137.0 lb

## 2017-02-09 DIAGNOSIS — J45909 Unspecified asthma, uncomplicated: Secondary | ICD-10-CM | POA: Diagnosis not present

## 2017-02-09 DIAGNOSIS — I358 Other nonrheumatic aortic valve disorders: Secondary | ICD-10-CM

## 2017-02-09 DIAGNOSIS — I2583 Coronary atherosclerosis due to lipid rich plaque: Secondary | ICD-10-CM | POA: Diagnosis not present

## 2017-02-09 DIAGNOSIS — R0602 Shortness of breath: Secondary | ICD-10-CM | POA: Diagnosis not present

## 2017-02-09 DIAGNOSIS — I10 Essential (primary) hypertension: Secondary | ICD-10-CM | POA: Diagnosis not present

## 2017-02-09 DIAGNOSIS — E785 Hyperlipidemia, unspecified: Secondary | ICD-10-CM

## 2017-02-09 DIAGNOSIS — Z87891 Personal history of nicotine dependence: Secondary | ICD-10-CM

## 2017-02-09 DIAGNOSIS — I2510 Atherosclerotic heart disease of native coronary artery without angina pectoris: Secondary | ICD-10-CM | POA: Diagnosis not present

## 2017-02-09 DIAGNOSIS — Z01812 Encounter for preprocedural laboratory examination: Secondary | ICD-10-CM

## 2017-02-09 LAB — CBC
HEMATOCRIT: 39.5 % (ref 34.0–46.6)
HEMOGLOBIN: 13.4 g/dL (ref 11.1–15.9)
MCH: 31.2 pg (ref 26.6–33.0)
MCHC: 33.9 g/dL (ref 31.5–35.7)
MCV: 92 fL (ref 79–97)
Platelets: 185 10*3/uL (ref 150–379)
RBC: 4.29 x10E6/uL (ref 3.77–5.28)
RDW: 13.6 % (ref 12.3–15.4)
WBC: 6.7 10*3/uL (ref 3.4–10.8)

## 2017-02-09 LAB — COMPREHENSIVE METABOLIC PANEL
A/G RATIO: 2.5 — AB (ref 1.2–2.2)
ALT: 18 IU/L (ref 0–32)
AST: 19 IU/L (ref 0–40)
Albumin: 4.7 g/dL (ref 3.5–4.8)
Alkaline Phosphatase: 78 IU/L (ref 39–117)
BUN/Creatinine Ratio: 26 (ref 12–28)
BUN: 14 mg/dL (ref 8–27)
Bilirubin Total: 0.3 mg/dL (ref 0.0–1.2)
CALCIUM: 9.6 mg/dL (ref 8.7–10.3)
CO2: 25 mmol/L (ref 20–29)
CREATININE: 0.54 mg/dL — AB (ref 0.57–1.00)
Chloride: 104 mmol/L (ref 96–106)
GFR, EST AFRICAN AMERICAN: 108 mL/min/{1.73_m2} (ref >59)
GFR, EST NON AFRICAN AMERICAN: 93 mL/min/{1.73_m2} (ref >59)
GLUCOSE: 99 mg/dL (ref 65–99)
Globulin, Total: 1.9 g/dL (ref 1.5–4.5)
POTASSIUM: 4.7 mmol/L (ref 3.5–5.2)
Sodium: 144 mmol/L (ref 134–144)
TOTAL PROTEIN: 6.6 g/dL (ref 6.0–8.5)

## 2017-02-09 LAB — LIPID PANEL
CHOL/HDL RATIO: 2.1 ratio (ref 0.0–4.4)
CHOLESTEROL TOTAL: 192 mg/dL (ref 100–199)
HDL: 93 mg/dL (ref >39)
LDL Calculated: 88 mg/dL (ref 0–99)
Triglycerides: 56 mg/dL (ref 0–149)
VLDL CHOLESTEROL CAL: 11 mg/dL (ref 5–40)

## 2017-02-09 LAB — TSH: TSH: 1.84 u[IU]/mL (ref 0.450–4.500)

## 2017-02-09 MED ORDER — AMLODIPINE BESYLATE 2.5 MG PO TABS
2.5000 mg | ORAL_TABLET | Freq: Every day | ORAL | 0 refills | Status: DC
Start: 2017-02-09 — End: 2017-02-09

## 2017-02-09 MED ORDER — AMLODIPINE BESYLATE 2.5 MG PO TABS: 2.5000 mg | ORAL_TABLET | Freq: Every day | ORAL | 3 refills | Status: DC

## 2017-02-09 MED ORDER — METOPROLOL TARTRATE 50 MG PO TABS: ORAL_TABLET | ORAL | 0 refills | Status: DC

## 2017-02-09 NOTE — Progress Notes (Signed)
Patient ID: Judith Stevens, female   DOB: Nov 14, 1940, 76 y.o.   MRN: [id number]     HPI: Judith Stevens is a 76 y.o. female who presents for a 5 month cardiology evaluation.  Ms. Philemon has CAD and in 2001 underwent cardiac catheterization while in [place] which showed a 60% ostial narrowing of her LAD. Repeat catheterization by me in 2009 did not show any significant interval change. Unfortunately, she has continued her ongoing tobacco habit but is now smoking significantly reduced at less than one quarter pack per day.  She has a history of hypertension as well as hyperlipidemia.  A nuclear perfusion study in August 2013 which was unchanged from 2 years previously and continued to show normal perfusion.   In December 2015 prior to undergoing right hip surgery a 2 year follow-up nuclear perfusion study on 03/13/2014 was performed preoperatively.  This continued to show normal perfusion without scar or ischemia.  She tolerated her hip surgery well.  She had smoked for 50 years before finally quitting smoking when I had seen her last.  When I last saw he 2 months ago, she had complained of experiencing increasing shortness of breath of 6 weeks duration that was not consistently exertionally precipitated.   She admitted to fatigue.  She is unaware of palpitations.  She has hyperlipidemia and has been taking atorvastatin 80 mg and Zetia 10 mg in attempt to continue to induce plaque regression.    An echo Doppler study on 02/18/2015 showed an EF of 55-60%, moderate thickening and calcification of a trileaflet aortic valve, consistent with sclerosis but without stenosis and mitral annular calcification.  Laboratory revealed excellent lipid studies with a total cholesterol 149, triglycerides 55, HDL 85 and her LDL was now 53.  Thyroid function studies were normal.  Her renal function was excellent.  LFTs were normal.  Vitamin D was normal at 41 and her vitamin B-12 level was normal at 448.    She  was seen by [name] in October 2017 with complaints of dyspnea on exertion.  She has a history of asthma as well as numerous allergies to foods and some drugs.  She tells me she had the flu in early January 2018.  When I last saw her in January 2018.  She had stop smoking.  She did have complaints of dyspnea.  I recommended a follow-up echo Doppler study which was done on 04/29/2016.  This showed normal LV function and normal diastolic parameters.  The aortic valve was mildly calcified and was without stenosis.  There was mild aortic insufficiency.  Her mitral valve annulus was calcified and leaflets were mildly thickened.  Her left atrium was mildly dilated.  Unfortunately, on 08/13/22 her dog had died.  She was very stressed and since that time resumed smoking.   Since I last saw her, she has continued to experience episodes of shortness of breath.  Her blood pressure has been labile.  She had spent 3 months caring for her sister out of town who had undergone aortic stent grafting.  She continues to smoke cigarettes and states that one pack may now last up to 2 weeks.  She has allergies to milk, egg yolk, and daily products.  She has asthma and has been taking Advair intermittently.  She has been evaluated by Dr. Verlin Fester, but was not satisfied and would like to see another physician.  She is on atorvastatin and Zetia for hyperlipidemia.  She has continued to be on diltiazem  240 mg daily for hypertension.  She presents for reevaluation.  Past Medical History:  Diagnosis Date  . Anxiety   . Arthritis   . Asthma   . CAD (coronary artery disease)    single vessel  . GERD (gastroesophageal reflux disease)   . Headache   . History of stress test 10/2011   Resting images reveal a normal pattern of perfusion in all regions. The post stress myocardial perfusion images show a normal pattern of perfusion in all region. The post stress left ventricle is normal size. the rest left ventricle is normal size,  there is no scintigraphic evidence of inductible myocardial ischemia.  Marland Kitchen Hx of echocardiogram 09/2009   EF >55% Normal Echo  . Hyperlipidemia   . Hypertension   . Migraines   . Pneumonia    hx of  . Pulmonary emboli ([place]) 5/12  . Shortness of breath dyspnea    exertion or eating something shes allergic too  . Tobacco abuse     Past Surgical History:  Procedure Laterality Date  . ADENOIDECTOMY    . BACK SURGERY  2012  . CARDIAC CATHETERIZATION  10/13/2007   60% narrowing twin LAD system  . FOOT SURGERY  2002,2013   right  . KNEE SURGERY  01/17/2007   right  . SACROILIAC JOINT FUSION Right 03/28/2014   Procedure: SACROILIAC JOINT FUSION;  Surgeon: Sinclair Ship, MD;  Location: [facility];  Service: Orthopedics;  Laterality: Right;  Right sided sacroliac joint fusion  . TONSILLECTOMY      Allergies  Allergen Reactions  . Cefzil [Cefprozil] Shortness Of Breath  . Keflex [Cephalexin] Shortness Of Breath and Rash  . Lactose Intolerance (Gi) Shortness Of Breath    ALL DAIRY PRODUCTS  . Levaquin [Levofloxacin] Shortness Of Breath and Rash  . Naproxen Shortness Of Breath    Shortness of breath Shortness of breath  . Oxycodone-Acetaminophen Nausea And Vomiting and Shortness Of Breath  . Peanuts [Peanut Oil] Shortness Of Breath  . Penicillins Shortness Of Breath  . Percocet [Oxycodone-Acetaminophen] Shortness Of Breath  . Red Dye Shortness Of Breath  . Sulfa Antibiotics Shortness Of Breath  . Sulfacetamide Sodium Shortness Of Breath  . Tetracycline Hcl Shortness Of Breath  . Tetracyclines & Related Shortness Of Breath  . Methocarbamol Nausea And Vomiting  . Tomato Other (See Comments)    SOB  . Amoxicillin-Pot Clavulanate Rash  . Ibuprofen Rash  . Moxifloxacin Rash  . Penicillin G Rash  . Sulfamethoxazole-Trimethoprim Rash  . Tetracycline Rash  . Tramadol Rash    Current Outpatient Medications  Medication Sig Dispense Refill  . ALPRAZolam (XANAX) 0.5 MG tablet  Take 0.5 mg by mouth at bedtime as needed for anxiety.    Marland Kitchen atorvastatin (LIPITOR) 80 MG tablet Take 1 tablet (80 mg total) by mouth daily. 90 tablet 3  . budesonide (PULMICORT) 0.5 MG/2ML nebulizer solution USE AS DIRECTED FOR NASAL RINSE 120 mL 0  . butalbital-acetaminophen-caffeine (FIORICET WITH CODEINE) 50-325-40-30 MG per capsule Take 1 capsule by mouth daily as needed for headache.   5  . Cholecalciferol (VITAMIN D3) 2000 UNITS TABS Take 2,000 Units by mouth daily.     . clindamycin (CLEOCIN) 300 MG capsule Take 300 mg by mouth 3 (three) times daily.  0  . diltiazem (CARDIZEM CD) 240 MG 24 hr capsule Take 1 capsule (240 mg total) by mouth at bedtime. 90 capsule 3  . escitalopram (LEXAPRO) 20 MG tablet Take 1 tablet (20 mg total) by mouth  daily. 90 tablet 0  . ezetimibe (ZETIA) 10 MG tablet Take 1 tablet (10 mg total) by mouth daily. 90 tablet 3  . Fluticasone-Salmeterol (ADVAIR DISKUS) 250-50 MCG/DOSE AEPB Inhale 1 puff into the lungs 2 (two) times daily.    . Multiple Vitamin (MULTI-VITAMIN DAILY PO) Take 1 tablet by mouth daily.    . nitroGLYCERIN (NITROSTAT) 0.4 MG SL tablet Place 1 tablet (0.4 mg total) under the tongue every 5 (five) minutes as needed for chest pain. Needs appointment for future refills 25 tablet 0  . ondansetron (ZOFRAN) 4 MG tablet Take 4 mg by mouth every 8 (eight) hours as needed for nausea.    . pantoprazole (PROTONIX) 40 MG tablet Take 80 mg by mouth 2 (two) times daily.     . terbutaline (BRETHINE) 5 MG tablet Take 5 mg by mouth every 6 (six) hours as needed (asthma).     . VENTOLIN HFA 108 (90 Base) MCG/ACT inhaler INHALE 2 PUFFS INTO THE LUNGS EVERY 6 (SIX) HOURS AS NEEDED FOR WHEEZING.  11  . amLODipine (NORVASC) 2.5 MG tablet Take 1 tablet (2.5 mg total) daily by mouth. 90 tablet 3  . metoprolol tartrate (LOPRESSOR) 50 MG tablet Take 50 mg (1 tablet) one hour prior to Cardiac CT 1 tablet 0   No current facility-administered medications for this visit.      Social History   Socioeconomic History  . Marital status: Widowed    Spouse name: Not on file  . Number of children: Not on file  . Years of education: Not on file  . Highest education level: Not on file  Social Needs  . Financial resource strain: Not on file  . Food insecurity - worry: Not on file  . Food insecurity - inability: Not on file  . Transportation needs - medical: Not on file  . Transportation needs - non-medical: Not on file  Occupational History  . Not on file  Tobacco Use  . Smoking status: Former Smoker    Packs/day: 0.00    Types: Cigarettes    Start date: 01/30/2014  . Smokeless tobacco: Former Systems developer    Quit date: 01/29/2014  Substance and Sexual Activity  . Alcohol use: Yes    Alcohol/week: 2.4 oz    Types: 2 Glasses of wine, 2 Cans of beer per week  . Drug use: No  . Sexual activity: Not on file  Other Topics Concern  . Not on file  Social History Narrative  . Not on file    Family History  Problem Relation Age of Onset  . Heart attack Mother   . Diabetes Mother   . Asthma Mother   . Heart attack Father   . Diabetes Sister   . Hyperlipidemia Sister   . Allergic rhinitis Neg Hx   . Angioedema Neg Hx   . Eczema Neg Hx   . Immunodeficiency Neg Hx   . Urticaria Neg Hx    Socially, she is widowed. She has 2 children 5 grandchildren. She does not exercise. She quit smoking 2 years ago. resumed on 22-Aug-2022 after her dog died.  She is committed to completely discontinue tobacco.   ROS General: Negative; No fevers, chills, or night sweats;  HEENT: Negative; No changes in vision or hearing, sinus congestion, difficulty swallowing Pulmonary: Positive for asthma Cardiovascular: Negative; No chest pain, presyncope, syncope, palpitations GI: positive for GERD; No nausea, vomiting, diarrhea, or abdominal pain GU: Negative; No dysuria, hematuria, or difficulty voiding Musculoskeletal: She tolerated  her hip surgery well. Hematologic/Oncology: Negative;  no easy bruising, bleeding Endocrine: Negative; no heat/cold intolerance; no diabetes Neuro: Negative; no changes in balance, headaches Skin: Negative; No rashes or skin lesions Psychiatric: Negative; No behavioral problems, depression Sleep: Negative; No snoring, daytime sleepiness, hypersomnolence, bruxism, restless legs, hypnogognic hallucinations, no cataplexy Other comprehensive 14 point system review is negative.  Nutritionally she cannot have dairy products due to significant milk allergy.  PE BP (!) 142/62   Pulse 67   Ht '5\' 4"'  (1.626 m)   Wt 137 lb (62.1 kg)   LMP  (LMP Unknown)   BMI 23.52 kg/m    Repeat blood pressure by me was 180/84  Wt Readings from Last 3 Encounters:  02/09/17 137 lb (62.1 kg)  09/24/16 144 lb 9.6 oz (65.6 kg)  08/18/16 139 lb 12.8 oz (63.4 kg)   General: Alert, oriented, no distress.  Skin: normal turgor, no rashes, warm and dry HEENT: Normocephalic, atraumatic. Pupils equal round and reactive to light; sclera anicteric; extraocular muscles intact; Nose without nasal septal hypertrophy Mouth/Parynx benign; Mallinpatti scale 3 Neck: No JVD, no carotid bruits; normal carotid upstroke Lungs: Decreased breath sounds without audible wheezing.  Chest wall: without tenderness to palpitation Heart: PMI not displaced, RRR, s1 s2 normal, 1/6 systolic murmur, no diastolic murmur, no rubs, gallops, thrills, or heaves Abdomen: soft, nontender; no hepatosplenomehaly, BS+; abdominal aorta nontender and not dilated by palpation. Back: no CVA tenderness Pulses 2+ Musculoskeletal: full range of motion, normal strength, no joint deformities Extremities: no clubbing cyanosis or edema, Homan's sign negative  Neurologic: grossly nonfocal; Cranial nerves grossly wnl Psychologic: Normal mood and affect   ECG (independently read by me): normal sinus rhythm at 67 bpm.  PAC.  Normal intervals.  June 2018 ECG (independently read by me): Sinus bradycardia 57 bpm.  No  significant ST-T changes.  Normal intervals.  January 2018 ECG (independently read by me): Normal sinus rhythm at 69 bpm.  No ectopy.  Normal intervals.  November 2016 ECG (independently read by me): Normal sinus rhythm at 60 bpm.  No significant ST-T changes.  Normal intervals.  November 2015 ECG (independently read by me): Normal sinus rhythm at 72 bpm.  Normal intervals.  No significant ST-T changes.  Prior October 2014 ECG: Sinus rhythm with occasional isolated PVC. Heart rate 68 beats per minute. QTc interval 440 ms.  LAB: BMP Latest Ref Rng & Units 02/08/2017 04/27/2016 01/24/2016  Glucose 65 - 99 mg/dL 99 98 99  BUN 8 - 27 mg/dL '14 13 7  ' Creatinine 0.57 - 1.00 mg/dL 0.54(L) 0.52(L) 0.50(L)  BUN/Creat Ratio 12 - 28 26 - -  Sodium 134 - 144 mmol/L 144 141 141  Potassium 3.5 - 5.2 mmol/L 4.7 5.1 5.0  Chloride 96 - 106 mmol/L 104 105 101  CO2 20 - 29 mmol/L '25 31 27  ' Calcium 8.7 - 10.3 mg/dL 9.6 9.0 9.5   Hepatic Function Latest Ref Rng & Units 02/08/2017 04/27/2016 02/19/2015  Total Protein 6.0 - 8.5 g/dL 6.6 6.3 6.2  Albumin 3.5 - 4.8 g/dL 4.7 4.1 3.7  AST 0 - 40 IU/L '19 18 19  ' ALT 0 - 32 IU/L '18 20 17  ' Alk Phosphatase 39 - 117 IU/L 78 71 94  Total Bilirubin 0.0 - 1.2 mg/dL 0.3 0.3 0.4   CBC Latest Ref Rng & Units 02/08/2017 04/27/2016 02/19/2015  WBC 3.4 - 10.8 x10E3/uL 6.7 6.6 10.0  Hemoglobin 11.1 - 15.9 g/dL 13.4 11.7 11.4(L)  Hematocrit 34.0 - 46.6 %  39.5 36.2 35.8(L)  Platelets 150 - 379 x10E3/uL 185 196 242   Lab Results  Component Value Date   MCV 92 02/08/2017   MCV 93.5 04/27/2016   MCV 91.1 02/19/2015   Lab Results  Component Value Date   TSH 1.840 02/08/2017   No results found for: HGBA1C   Lipid Panel     Component Value Date/Time   CHOL 192 02/08/2017 0858   TRIG 56 02/08/2017 0858   HDL 93 02/08/2017 0858   CHOLHDL 2.1 02/08/2017 0858   CHOLHDL 2.0 04/27/2016 0923   VLDL 10 04/27/2016 0923   LDLCALC 88 02/08/2017 0858    RADIOLOGY: No  results found.  IMPRESSION:  1. Shortness of breath   2. Coronary artery disease due to lipid rich plaque   3. Aortic valve sclerosis   4. Essential hypertension   5. Hyperlipidemia LDL goal <70   6. Pre-procedure lab exam   7. Hx of tobacco use, presenting hazards to health   8. Mild asthma without complication, unspecified whether persistent     ASSESSMENT AND PLAN: Judith Stevens is a 76 year old white female who had smoked for over 50 years and quit smoking 2 years ago.  Unfortunately, she was under significant stress with the death of her dog suddenly and resumed smoking since May 2008 She has CAD with a 60% ostial LAD stenosis dating back to 2001 and which again was noted on her repeat catheterization by me in 2009.  We have been aggressively treating her lipids in attempt to induce plaque regression and she has tolerated Lipitor 80 mg in addition to Zetia 10 mg. Laboratory studies in 2016 were excellent with an LDL of 53, which hopefully is inducing plaque regression continued stability.  Lab work on 02/08/2017 has shown slight increase in LDL at 88.  She has had labile blood pressure.  Her blood pressure initially today was 142/62, but  repeat by me was 180/84.  She has continued to take diltiazem 240 mg daily. When  I last saw her, her blood pressure was low.  She will monitor blood pressure closely.  She states that she does wheeze fairly frequently.  I have recommended that she utilize her Advair 1 puff twice a day.  She has had issues with shortness of breath was can occur any time.  Her last echo Doppler study revealed normal EF at 55-60%.  She had normal left ventricular diastolic parameters.  There was mild aortic insufficiency and mitral annular calcification.  With her ongoing tobacco history, I have recommended further evaluation for her CAD.   a'm scheduling her to undergo a coronary CT angiogram to reassess her previously noted, ostial LAD disease.  I again have recommended complete  smoking cessation.  I have also recommended pulmonology evaluation and will refer her to Dr. Curt Jews for further evaluation.  With her long-standing tobacco history.  I suspect she will undergo a high-resolution CT scan, but I will defer this to Dr. Lake Bells any further evaluation he deems appropriate.  I'll see her in several months for follow-up evaluation.   Troy Sine, MD, [facility]  02/16/2017 9:44 PM

## 2017-02-09 NOTE — Patient Instructions (Addendum)
Medication Instructions: START amlodipine 2.5 mg (1 tablet) daily   Take your Advair twice daily  **you will take metoprolol 50 mg (1 tablet) 1 hour prior to cardiac CT**  Labwork: BMET prior to cardiac CT if scheduled after 12/12  Testing/Procedures: Your physician has requested that you have cardiac CT. Cardiac computed tomography (CT) is a painless test that uses an x-ray machine to take clear, detailed pictures of your heart. For further information please visit [email or web address]. Please follow instruction sheet as given.  Follow-Up: You have been referred to Pulmonology-Dr. Kendrick FriesMcQuaid  Your physician recommends that you schedule a follow-up appointment in: 2 months with Dr. Tresa EndoKelly.   Any Other Special Instructions Will Be Listed Below (If Applicable).  Please arrive at the [facility] main entrance of [facility] (30-45 minutes prior to test start time)  [facility] [address] [place], [place] [address] [phone]  Proceed to the [facility] Radiology Department (First Floor).  Please follow these instructions carefully (unless otherwise directed):  On the Night Before the Test: . Drink plenty of water. . Do not consume any caffeinated/decaffeinated beverages or chocolate 12 hours prior to your test. . Do not take any antihistamines 12 hours prior to your test. . If you take Metformin do not take 24 hours prior to test.  On the Day of the Test: . Drink plenty of water. Do not drink any water within one hour of the test. . Do not eat any food 4 hours prior to the test. . You may take your regular medications prior to the test. . IF NOT ON A BETA BLOCKER - Take 50 mg of lopressor (metoprolol) one hour before the test. . HOLD Furosemide morning of the test.  After the Test: . Drink plenty of water. . After receiving IV contrast, you may experience a mild flushed feeling. This is normal. . On occasion, you may experience a mild rash up to 24  hours after the test. This is not dangerous. If this occurs, you can take Benadryl 25 mg and increase your fluid intake. . If you experience trouble breathing, this can be serious. If it is severe call 911 IMMEDIATELY. If it is mild, please call our office. . If you take any of these medications: Glipizide/Metformin, Avandament, Glucavance, please do not take 48 hours after completing test.

## 2017-02-11 ENCOUNTER — Telehealth: Payer: Self-pay | Admitting: Cardiovascular Disease

## 2017-02-11 NOTE — Telephone Encounter (Signed)
New Message   Patient calling regarding some tests the needs to have done. Wanting to talk to nurse first.Requesting call back.

## 2017-02-11 NOTE — Telephone Encounter (Signed)
Returned call to pt she states that she is confused, she states that her paperwork says to have cardiac CT and to refer to pulmonary but it does not say what order. Which should she do first, and should she have CT done before pulmonary appt? Please advise

## 2017-02-11 NOTE — Telephone Encounter (Signed)
There is no necessary order.  The cardiac CT scan can be done as soon as he can get scheduled.  The pulmonary referral to Dr. Kathrin PennerMcQuade can be done as soon as it can get referred.  I will will defer to Dr. Kathrin PennerMcQuade as to if he wants.  The patient also to have a high resolution chest CT or other studies.

## 2017-02-12 NOTE — Telephone Encounter (Signed)
Left detailed message about Dr Ellin GoodieKellys message and to ask surgeon if they want appt after CT

## 2017-02-15 ENCOUNTER — Telehealth: Payer: Self-pay | Admitting: Cardiovascular Disease

## 2017-02-15 NOTE — Telephone Encounter (Signed)
Returned patient's call to clarify what type of test.  I will forward this message to [employer].

## 2017-02-15 NOTE — Telephone Encounter (Signed)
Judith Stevens is calling because she would like to find out how long willl it take to get approval for the test she is schedule to have. Pease call

## 2017-02-16 ENCOUNTER — Encounter: Payer: Self-pay | Admitting: Cardiovascular Disease

## 2017-02-22 NOTE — Telephone Encounter (Signed)
F/u Call:  Patient calling states that she was told by pre-cert that she did not have to wait to get an approval before scheduling CT.

## 2017-02-22 NOTE — Telephone Encounter (Signed)
I'm unclear on what is needed - does a test just need to be scheduled? If [employer] has contacted patient, will fwd to scheduling to arrange.

## 2017-02-23 NOTE — Telephone Encounter (Signed)
Pt says she needs somebody to call her today please.Marland Kitchen. She would like to have her test before Christmas.

## 2017-03-03 ENCOUNTER — Institutional Professional Consult (permissible substitution): Payer: Medicare Other | Admitting: Pulmonary Disease

## 2017-03-12 ENCOUNTER — Ambulatory Visit (HOSPITAL_COMMUNITY)
Admission: RE | Admit: 2017-03-12 | Discharge: 2017-03-12 | Disposition: A | Discharge status: Home / Self Care | Payer: Medicare Other | Source: Ambulatory Visit | Attending: Cardiovascular Disease | Admitting: Cardiovascular Disease

## 2017-03-12 DIAGNOSIS — I2510 Atherosclerotic heart disease of native coronary artery without angina pectoris: Secondary | ICD-10-CM

## 2017-03-12 DIAGNOSIS — M47814 Spondylosis without myelopathy or radiculopathy, thoracic region: Secondary | ICD-10-CM | POA: Diagnosis not present

## 2017-03-12 DIAGNOSIS — I358 Other nonrheumatic aortic valve disorders: Secondary | ICD-10-CM | POA: Diagnosis not present

## 2017-03-12 DIAGNOSIS — R0602 Shortness of breath: Secondary | ICD-10-CM

## 2017-03-12 DIAGNOSIS — I2583 Coronary atherosclerosis due to lipid rich plaque: Secondary | ICD-10-CM | POA: Insufficient documentation

## 2017-03-12 MED ORDER — IOPAMIDOL (ISOVUE-370) INJECTION 76%
INTRAVENOUS | Status: AC
  Administered 2017-03-12: 80 mL via INTRAVENOUS
  Filled 2017-03-12: qty 100

## 2017-03-12 MED ORDER — NITROGLYCERIN 0.4 MG SL SUBL
0.8000 mg | SUBLINGUAL_TABLET | Freq: Once | SUBLINGUAL | Status: AC
Start: 2017-03-12 — End: 2017-03-12
  Administered 2017-03-12: 0.8 mg via SUBLINGUAL

## 2017-03-12 MED ORDER — NITROGLYCERIN 0.4 MG SL SUBL
SUBLINGUAL_TABLET | SUBLINGUAL | Status: AC
  Filled 2017-03-12: qty 2

## 2017-03-12 MED ORDER — METOPROLOL TARTRATE 5 MG/5ML IV SOLN
INTRAVENOUS | Status: AC
  Filled 2017-03-12: qty 5

## 2017-03-12 NOTE — Progress Notes (Signed)
CT scan completed. Tolerated well. D/C home walking. In no distress. 

## 2017-03-15 ENCOUNTER — Telehealth: Payer: Self-pay | Admitting: Cardiovascular Disease

## 2017-03-15 MED ORDER — DILTIAZEM HCL ER COATED BEADS 240 MG PO CP24: 240.0000 mg | ORAL_CAPSULE | Freq: Every day | ORAL | 1 refills | Status: DC

## 2017-03-15 MED ORDER — DILTIAZEM HCL ER COATED BEADS 240 MG PO CP24: 240.0000 mg | ORAL_CAPSULE | Freq: Every day | ORAL | 3 refills | Status: DC

## 2017-03-15 NOTE — Telephone Encounter (Signed)
Okay to refill diltiazem.  Cardiac CT results are not yet available for review.

## 2017-03-15 NOTE — Telephone Encounter (Signed)
REFILL SENT AS REQUESTED ?

## 2017-03-15 NOTE — Telephone Encounter (Signed)
Spoke with pt, aware results not available at this time.

## 2017-03-15 NOTE — Telephone Encounter (Signed)
Returned call to patient she stated she needs 90 day Diltiazem refill [facility].She also would like results of coronary ct done 03/12/17 before this Friday.She is going out of town and would like results.Advised I will send in Diltiazem refill.Advised coronary ct results not available.I will send message to Dr.Kelly.

## 2017-03-15 NOTE — Telephone Encounter (Signed)
New Message     *STAT* If patient is at the pharmacy, call can be transferred to refill team.   1. Which medications need to be refilled? (please list name of each medication and dose if known) Dialtiazem  2. Which pharmacy/location (including street and city if local pharmacy) is medication to be sent to? [facility] located on [address]   3. Do they need a 30 day or 90 day supply? 30 day

## 2017-03-15 NOTE — Telephone Encounter (Signed)
F/u Message  Pt call to f/u on medication diltiazem. Pt would like to know when that medication was sent to the pharmacy did it cancel out her other bp medication .please call back to discuss

## 2017-03-15 NOTE — Telephone Encounter (Signed)
Follow up    Patient is calling to get CT results.

## 2017-03-19 ENCOUNTER — Telehealth: Payer: Self-pay | Admitting: Cardiovascular Disease

## 2017-03-19 NOTE — Telephone Encounter (Signed)
Patient aware of MD recommendations 

## 2017-03-19 NOTE — Telephone Encounter (Signed)
Patient called with coronary CT results - FFR pending. She would like to know if it is OK for her to fly to [place] - her flight leaves on Sunday 12/23  Will route to Dr. Tresa EndoKelly + DOD (Dr. Allyson SabalBerry)

## 2017-03-19 NOTE — Telephone Encounter (Signed)
Coronary CTA and FFR were entirely normal. Okay to fly to [place].

## 2017-03-19 NOTE — Telephone Encounter (Signed)
Pt would like her CT results please from 03-12-17.

## 2017-04-05 ENCOUNTER — Ambulatory Visit: Payer: Medicare Other | Admitting: Cardiovascular Disease

## 2017-05-04 ENCOUNTER — Ambulatory Visit (INDEPENDENT_AMBULATORY_CARE_PROVIDER_SITE_OTHER): Payer: Medicare Other | Admitting: Cardiovascular Disease

## 2017-05-04 ENCOUNTER — Encounter: Payer: Self-pay | Admitting: Cardiovascular Disease

## 2017-05-04 VITALS — BP 140/59 | HR 77 | Resp 16 | Ht 64.0 in | Wt 136.8 lb

## 2017-05-04 DIAGNOSIS — I2510 Atherosclerotic heart disease of native coronary artery without angina pectoris: Secondary | ICD-10-CM

## 2017-05-04 DIAGNOSIS — E785 Hyperlipidemia, unspecified: Secondary | ICD-10-CM

## 2017-05-04 DIAGNOSIS — I2583 Coronary atherosclerosis due to lipid rich plaque: Secondary | ICD-10-CM | POA: Diagnosis not present

## 2017-05-04 DIAGNOSIS — Z87891 Personal history of nicotine dependence: Secondary | ICD-10-CM

## 2017-05-04 DIAGNOSIS — Z79899 Other long term (current) drug therapy: Secondary | ICD-10-CM | POA: Diagnosis not present

## 2017-05-04 DIAGNOSIS — I10 Essential (primary) hypertension: Secondary | ICD-10-CM

## 2017-05-04 MED ORDER — DILTIAZEM HCL ER COATED BEADS 300 MG PO CP24: 300.0000 mg | ORAL_CAPSULE | Freq: Every day | ORAL | 3 refills | Status: DC

## 2017-05-04 NOTE — Progress Notes (Signed)
Patient ID: Judith Stevens, female   DOB: 1941/01/09, 77 y.o.   MRN: [id number]     HPI: Judith Stevens is a 77 y.o. female who presents for an 23 month cardiology evaluation.  Judith Stevens has CAD and in 2001 underwent cardiac catheterization while in [place] which showed a 60% ostial narrowing of her LAD. Repeat catheterization by me in 2009 did not show any significant interval change. Unfortunately, she has continued her ongoing tobacco habit but is now smoking significantly reduced at less than one quarter pack per day.  She has a history of hypertension as well as hyperlipidemia.  A nuclear perfusion study in August 2013 which was unchanged from 2 years previously and continued to show normal perfusion.   In December 2015 prior to undergoing right hip surgery a 2 year follow-up nuclear perfusion study on 03/13/2014 was performed preoperatively.  This continued to show normal perfusion without scar or ischemia.  She tolerated her hip surgery well.  She had smoked for 50 years before finally quitting smoking when I had seen her last.  When I last saw he 2 months ago, she had complained of experiencing increasing shortness of breath of 6 weeks duration that was not consistently exertionally precipitated.   She admitted to fatigue.  She is unaware of palpitations.  She has hyperlipidemia and has been taking atorvastatin 80 mg and Zetia 10 mg in attempt to continue to induce plaque regression.    An echo Doppler study on 02/18/2015 showed an EF of 55-60%, moderate thickening and calcification of a trileaflet aortic valve, consistent with sclerosis but without stenosis and mitral annular calcification.  Laboratory revealed excellent lipid studies with a total cholesterol 149, triglycerides 55, HDL 85 and her LDL was now 53.  Thyroid function studies were normal.  Her renal function was excellent.  LFTs were normal.  Vitamin D was normal at 41 and her vitamin B-12 level was normal at 448.     She was seen by [name] in October 2017 with complaints of dyspnea on exertion.  She has a history of asthma as well as numerous allergies to foods and some drugs.  She tells me she had the flu in early January 2018.  When I last saw her in January 2018.  She had stop smoking.  She did have complaints of dyspnea.  I recommended a follow-up echo Doppler study which was done on 04/29/2016.  This showed normal LV function and normal diastolic parameters.  The aortic valve was mildly calcified and was without stenosis.  There was mild aortic insufficiency.  Her mitral valve annulus was calcified and leaflets were mildly thickened.  Her left atrium was mildly dilated.   I last saw her in November 2018.  Prior to that she has been 3 months caring for her sister 80 who had undergone aortic stent grafting.  She has allergies to milk, HDL, daily products and has seen an allergist.  When I saw her, I recommended complete smoking cessation and suggested a pulmonary evaluation with Dr. Curt Jews.  She has not had this done.  She underwent a coronary CT, which showed a calcium score of 81 which was 51 percentile for age and sex matched control.  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  There is right dominance.  There was diffuse calcified plaque in the RCA and proximal LAD with narrowing of 25-50%.  7.  She underwent FFR  analysis with both lesions being in the normal FFR range of greater than 0.80.  She has significantly reduced her tobacco and now smokes very intermittently.  She presents for reevaluation.  Past Medical History:  Diagnosis Date  . Anxiety   . Arthritis   . Asthma   . CAD (coronary artery disease)    single vessel  . GERD (gastroesophageal reflux disease)   . Headache   . History of stress test 10/2011   Resting images reveal a normal pattern of perfusion in all regions. The post stress myocardial perfusion  images show a normal pattern of perfusion in all region. The post stress left ventricle is normal size. the rest left ventricle is normal size, there is no scintigraphic evidence of inductible myocardial ischemia.  Marland Kitchen Hx of echocardiogram 09/2009   EF >55% Normal Echo  . Hyperlipidemia   . Hypertension   . Migraines   . Pneumonia    hx of  . Pulmonary emboli ([place]) 5/12  . Shortness of breath dyspnea    exertion or eating something shes allergic too  . Tobacco abuse     Past Surgical History:  Procedure Laterality Date  . ADENOIDECTOMY    . BACK SURGERY  2012  . CARDIAC CATHETERIZATION  10/13/2007   60% narrowing twin LAD system  . FOOT SURGERY  2002,2013   right  . KNEE SURGERY  01/17/2007   right  . SACROILIAC JOINT FUSION Right 03/28/2014   Procedure: SACROILIAC JOINT FUSION;  Surgeon: Sinclair Ship, MD;  Location: [facility];  Service: Orthopedics;  Laterality: Right;  Right sided sacroliac joint fusion  . TONSILLECTOMY      Allergies  Allergen Reactions  . Cefzil [Cefprozil] Shortness Of Breath  . Keflex [Cephalexin] Shortness Of Breath and Rash  . Lactose Intolerance (Gi) Shortness Of Breath    ALL DAIRY PRODUCTS  . Levaquin [Levofloxacin] Shortness Of Breath and Rash  . Naproxen Shortness Of Breath    Shortness of breath Shortness of breath  . Oxycodone-Acetaminophen Nausea And Vomiting and Shortness Of Breath  . Peanuts [Peanut Oil] Shortness Of Breath  . Penicillins Shortness Of Breath  . Percocet [Oxycodone-Acetaminophen] Shortness Of Breath  . Red Dye Shortness Of Breath  . Sulfa Antibiotics Shortness Of Breath  . Sulfacetamide Sodium Shortness Of Breath  . Tetracycline Hcl Shortness Of Breath  . Tetracyclines & Related Shortness Of Breath  . Methocarbamol Nausea And Vomiting  . Tomato Other (See Comments)    SOB  . Amoxicillin-Pot Clavulanate Rash  . Ibuprofen Rash  . Moxifloxacin Rash  . Penicillin G Rash  . Sulfamethoxazole-Trimethoprim Rash  .  Tetracycline Rash  . Tramadol Rash    Current Outpatient Medications  Medication Sig Dispense Refill  . ALPRAZolam (XANAX) 0.5 MG tablet Take 0.5 mg by mouth at bedtime as needed for anxiety.    Marland Kitchen atorvastatin (LIPITOR) 80 MG tablet Take 1 tablet (80 mg total) by mouth daily. 90 tablet 3  . budesonide (PULMICORT) 0.5 MG/2ML nebulizer solution USE AS DIRECTED FOR NASAL RINSE 120 mL 0  . butalbital-acetaminophen-caffeine (FIORICET WITH CODEINE) 50-325-40-30 MG per capsule Take 1 capsule by mouth daily as needed for headache.   5  . Cholecalciferol (VITAMIN D3) 2000 UNITS TABS Take 2,000 Units by mouth daily.     Marland Kitchen diltiazem (CARDIZEM CD) 300 MG 24 hr capsule Take 1 capsule (300 mg total) by mouth at bedtime. 90 capsule 3  . escitalopram (LEXAPRO) 20 MG tablet Take 1 tablet (20 mg  total) by mouth daily. 90 tablet 0  . ezetimibe (ZETIA) 10 MG tablet Take 1 tablet (10 mg total) by mouth daily. 90 tablet 3  . Fluticasone-Salmeterol (ADVAIR DISKUS) 250-50 MCG/DOSE AEPB Inhale 1 puff into the lungs 2 (two) times daily.    . metoprolol tartrate (LOPRESSOR) 50 MG tablet Take 50 mg (1 tablet) one hour prior to Cardiac CT 1 tablet 0  . Multiple Vitamin (MULTI-VITAMIN DAILY PO) Take 1 tablet by mouth daily.    . nitroGLYCERIN (NITROSTAT) 0.4 MG SL tablet Place 1 tablet (0.4 mg total) under the tongue every 5 (five) minutes as needed for chest pain. Needs appointment for future refills 25 tablet 0  . ondansetron (ZOFRAN) 4 MG tablet Take 4 mg by mouth every 8 (eight) hours as needed for nausea.    . pantoprazole (PROTONIX) 40 MG tablet Take 80 mg by mouth 2 (two) times daily.     Marland Kitchen Phenylephrine HCl ([facility] SINUS PE DECONGESTANT PO) Take by mouth.    . predniSONE (DELTASONE) 10 MG tablet Take 3 tablets PO for 3 days, then take 2 tablets PO for 3 days, then take 1 tablet PO for 3 days.    . terbutaline (BRETHINE) 5 MG tablet Take 5 mg by mouth every 6 (six) hours as needed (asthma).     . VENTOLIN HFA 108 (90  Base) MCG/ACT inhaler INHALE 2 PUFFS INTO THE LUNGS EVERY 6 (SIX) HOURS AS NEEDED FOR WHEEZING.  11   No current facility-administered medications for this visit.     Social History   Socioeconomic History  . Marital status: Widowed    Spouse name: Not on file  . Number of children: Not on file  . Years of education: Not on file  . Highest education level: Not on file  Social Needs  . Financial resource strain: Not on file  . Food insecurity - worry: Not on file  . Food insecurity - inability: Not on file  . Transportation needs - medical: Not on file  . Transportation needs - non-medical: Not on file  Occupational History  . Not on file  Tobacco Use  . Smoking status: Former Smoker    Packs/day: 0.00    Types: Cigarettes    Start date: 01/30/2014  . Smokeless tobacco: Former Systems developer    Quit date: 01/29/2014  Substance and Sexual Activity  . Alcohol use: Yes    Alcohol/week: 2.4 oz    Types: 2 Glasses of wine, 2 Cans of beer per week  . Drug use: No  . Sexual activity: Not on file  Other Topics Concern  . Not on file  Social History Narrative  . Not on file    Family History  Problem Relation Age of Onset  . Heart attack Mother   . Diabetes Mother   . Asthma Mother   . Heart attack Father   . Diabetes Sister   . Hyperlipidemia Sister   . Allergic rhinitis Neg Hx   . Angioedema Neg Hx   . Eczema Neg Hx   . Immunodeficiency Neg Hx   . Urticaria Neg Hx    Socially, she is widowed. She has 2 children 5 grandchildren. She does not exercise. She quit smoking 2 years ago. resumed on 2022-08-12 after her dog died.  She is committed to completely discontinue tobacco.   ROS General: Negative; No fevers, chills, or night sweats;  HEENT: Negative; No changes in vision or hearing, sinus congestion, difficulty swallowing Pulmonary: Positive for asthma  Cardiovascular: Negative; No chest pain, presyncope, syncope, palpitations GI: positive for GERD; No nausea, vomiting, diarrhea,  or abdominal pain GU: Negative; No dysuria, hematuria, or difficulty voiding Musculoskeletal: She tolerated her hip surgery well. Hematologic/Oncology: Negative; no easy bruising, bleeding Endocrine: Negative; no heat/cold intolerance; no diabetes Neuro: Negative; no changes in balance, headaches Skin: Negative; No rashes or skin lesions Psychiatric: Negative; No behavioral problems, depression Sleep: Negative; No snoring, daytime sleepiness, hypersomnolence, bruxism, restless legs, hypnogognic hallucinations, no cataplexy Other comprehensive 14 point system review is negative.  Nutritionally she cannot have dairy products due to significant milk allergy.  PE BP (!) 140/59   Pulse 77   Resp 16   Ht _0  (1.626 m)   Wt 136 lb 12.8 oz (62.1 kg)   LMP  (LMP Unknown)   SpO2 95%   BMI 23.48 kg/m   Repeat blood pressure was 128/64.  Wt Readings from Last 3 Encounters:  05/04/17 136 lb 12.8 oz (62.1 kg)  02/09/17 137 lb (62.1 kg)  09/24/16 144 lb 9.6 oz (65.6 kg)   General: Alert, oriented, no distress.  Skin: normal turgor, no rashes, warm and dry HEENT: Normocephalic, atraumatic. Pupils equal round and reactive to light; sclera anicteric; extraocular muscles intact;  Nose without nasal septal hypertrophy Mouth/Parynx benign; Mallinpatti scale 3 Neck: No JVD, very soft carotid bruits; normal carotid upstroke Lungs: clear to ausculatation and percussion; no wheezing or rales Chest wall: without tenderness to palpitation Heart: PMI not displaced, RRR, s1 s2 normal, 1/6 systolic murmur, no diastolic murmur, no rubs, gallops, thrills, or heaves Abdomen: soft, nontender; no hepatosplenomehaly, BS+; abdominal aorta nontender and not dilated by palpation. Back: no CVA tenderness Pulses 2+ Musculoskeletal: full range of motion, normal strength, no joint deformities Extremities: no clubbing cyanosis or edema, Homan's sign negative  Neurologic: grossly nonfocal; Cranial nerves grossly  wnl Psychologic: Normal mood and affect   ECG (independently read by me): Normal sinus rhythm at 72 bpm.  Nonspecific ST changes inferiorly.  QTc interval 451 ms.  June 2018 ECG (independently read by me): Sinus bradycardia 57 bpm.  No significant ST-T changes.  Normal intervals.  January 2018 ECG (independently read by me): Normal sinus rhythm at 69 bpm.  No ectopy.  Normal intervals.  November 2016 ECG (independently read by me): Normal sinus rhythm at 60 bpm.  No significant ST-T changes.  Normal intervals.  November 2015 ECG (independently read by me): Normal sinus rhythm at 72 bpm.  Normal intervals.  No significant ST-T changes.  Prior October 2014 ECG: Sinus rhythm with occasional isolated PVC. Heart rate 68 beats per minute. QTc interval 440 ms.  LAB: BMP Latest Ref Rng & Units 02/08/2017 04/27/2016 01/24/2016  Glucose 65 - 99 mg/dL 99 98 99  BUN 8 - 27 mg/dL _1 Creatinine 0.57 - 1.00 mg/dL 0.54(L) 0.52(L) 0.50(L)  BUN/Creat Ratio 12 - 28 26 - -  Sodium 134 - 144 mmol/L 144 141 141  Potassium 3.5 - 5.2 mmol/L 4.7 5.1 5.0  Chloride 96 - 106 mmol/L 104 105 101  CO2 20 - 29 mmol/L _2 Calcium 8.7 - 10.3 mg/dL 9.6 9.0 9.5   Hepatic Function Latest Ref Rng & Units 02/08/2017 04/27/2016 02/19/2015  Total Protein 6.0 - 8.5 g/dL 6.6 6.3 6.2  Albumin 3.5 - 4.8 g/dL 4.7 4.1 3.7  AST 0 - 40 IU/L _3 ALT 0 - 32 IU/L _4 Alk Phosphatase 39 - 117 IU/L 78  71 94  Total Bilirubin 0.0 - 1.2 mg/dL 0.3 0.3 0.4   CBC Latest Ref Rng & Units 02/08/2017 04/27/2016 02/19/2015  WBC 3.4 - 10.8 x10E3/uL 6.7 6.6 10.0  Hemoglobin 11.1 - 15.9 g/dL 13.4 11.7 11.4(L)  Hematocrit 34.0 - 46.6 % 39.5 36.2 35.8(L)  Platelets 150 - 379 x10E3/uL 185 196 242   Lab Results  Component Value Date   MCV 92 02/08/2017   MCV 93.5 04/27/2016   MCV 91.1 02/19/2015   Lab Results  Component Value Date   TSH 1.840 02/08/2017   No results found for: HGBA1C   Lipid Panel     Component  Value Date/Time   CHOL 192 02/08/2017 0858   TRIG 56 02/08/2017 0858   HDL 93 02/08/2017 0858   CHOLHDL 2.1 02/08/2017 0858   CHOLHDL 2.0 04/27/2016 0923   VLDL 10 04/27/2016 0923   LDLCALC 88 02/08/2017 0858    RADIOLOGY: No results found.  IMPRESSION:  1. Coronary artery disease due to lipid rich plaque   2. Hyperlipidemia LDL goal <70   3. Essential hypertension   4. Medication management   5. Hx of tobacco use, presenting hazards to health     ASSESSMENT AND PLAN: Judith Stevens is a 77 year old white female who had smoked for over 50 years and quit smoking 2 years ago.  Unfortunately, she was under significant stress with the  death of her dog suddenly and resumed smoking in May 2018 and at present smokes very intermittently.   She has CAD with a 60% ostial LAD stenosis dating back to 2001 and which again was noted on her repeat catheterization by me in 2009.  We have been aggressively treating her lipids in attempt to induce plaque regression and she is tolerated Lipitor 80 mg in addition to Zetia 10 mg. her last laboratory studies in 2016 were excellent with an LDL of 53, which hopefully is inducing plaque regression continued stability.  .  She recently underwent coronary CT angiography which did not demonstrate significant progression of disease and showed 25-50% stenoses in the LAD and RCA; FFR was negative.  Her blood pressure today was initially elevated but improved on recheck by me on a regimen consisting of diltiazem to to 40 mg apparently, she also is taking amlodipine 2.5 mg.  I have recommended discontinuance of amlodipine.  I will further titrate diltiazem to 300 mg daily.  She is on atorvastatin 80 mg and Zetia 10 mg for hyperlipidemia with target LDL less than 70.  She has GERD on Protonix.  She is now on Advair and at present, denies wheezing.  She never pursued her pulmonary evaluation with Dr. Curt Jews.  I reviewed her CT images in detail.  Repeat laboratory in the fasting  state will be obtained in 6 months and I will see her in the office for follow-up evaluation.  Time spent: 25 minutes  Troy Sine, MD, [facility]  05/06/2017 7:39 PM

## 2017-05-04 NOTE — Patient Instructions (Addendum)
Medication Instructions:  INCREASE diltiazem to 300 mg daily  STOP amlodipine  Labs: Please return for FASTING labs in 6 months (CMET, CBC, Lipid, TSH)-lab orders provided.   Follow-Up: Your physician wants you to follow-up in: 6 months with Dr. Tresa EndoKelly.  You will receive a reminder letter in the mail two months in advance. If you don't receive a letter, please call our office to schedule the follow-up appointment.   Any Other Special Instructions Will Be Listed Below (If Applicable).     If you need a refill on your cardiac medications before your next appointment, please call your pharmacy.

## 2017-05-06 ENCOUNTER — Encounter: Payer: Self-pay | Admitting: Cardiovascular Disease

## 2017-06-10 ENCOUNTER — Telehealth: Payer: Self-pay | Admitting: Cardiovascular Disease

## 2017-06-10 NOTE — Telephone Encounter (Signed)
New message      *STAT* If patient is at the pharmacy, call can be transferred to refill team.   1. Which medications need to be refilled? (please list name of each medication and dose if known)  ezetimibe (ZETIA) 10 MG tablet Take 1 tablet (10 mg total) by mouth daily.   atorvastatin (LIPITOR) 80 MG tablet Take 1 tablet (80 mg total) by mouth daily.      2. Which pharmacy/location (including street and city if local pharmacy) is medication to be sent to? [facility]   3. Do they need a 30 day or 90 day supply?  90

## 2017-06-11 MED ORDER — ATORVASTATIN CALCIUM 80 MG PO TABS: 80.0000 mg | ORAL_TABLET | Freq: Every day | ORAL | 1 refills | Status: DC

## 2017-06-11 MED ORDER — EZETIMIBE 10 MG PO TABS: 10.0000 mg | ORAL_TABLET | Freq: Every day | ORAL | 1 refills | Status: DC

## 2017-11-01 ENCOUNTER — Telehealth: Payer: Self-pay | Admitting: Cardiovascular Disease

## 2017-11-01 NOTE — Telephone Encounter (Signed)
New Message:     Pt would like for you to put an order in for her to have lab work tomorrow or Wednesday. She is seeing Wynema BirchHao on Friday and wants her lab work before she comes in.

## 2017-11-01 NOTE — Telephone Encounter (Signed)
SPOKE TO PATIENT - LABSLIP WILL BE AVAILABLE - COME AFTER  8 AM  DO NOT DRINK OR EAT  SUGARS OR  DAIRY, ANYTHING WITH CALORIES PATIENT VERBALIZED UNDERSTANDING.

## 2017-11-03 LAB — COMPREHENSIVE METABOLIC PANEL
ALT: 22 IU/L (ref 0–32)
AST: 16 IU/L (ref 0–40)
Albumin/Globulin Ratio: 2.8 — ABNORMAL HIGH (ref 1.2–2.2)
Albumin: 4.5 g/dL (ref 3.5–4.8)
Alkaline Phosphatase: 67 IU/L (ref 39–117)
BILIRUBIN TOTAL: 0.2 mg/dL (ref 0.0–1.2)
BUN/Creatinine Ratio: 26 (ref 12–28)
BUN: 14 mg/dL (ref 8–27)
CHLORIDE: 103 mmol/L (ref 96–106)
CO2: 25 mmol/L (ref 20–29)
Calcium: 9.3 mg/dL (ref 8.7–10.3)
Creatinine, Ser: 0.54 mg/dL — ABNORMAL LOW (ref 0.57–1.00)
GFR calc Af Amer: 107 mL/min/{1.73_m2} (ref >59)
GFR calc non Af Amer: 93 mL/min/{1.73_m2} (ref >59)
GLUCOSE: 95 mg/dL (ref 65–99)
Globulin, Total: 1.6 g/dL (ref 1.5–4.5)
Potassium: 4.9 mmol/L (ref 3.5–5.2)
Sodium: 142 mmol/L (ref 134–144)
Total Protein: 6.1 g/dL (ref 6.0–8.5)

## 2017-11-03 LAB — CBC
HEMATOCRIT: 36.5 % (ref 34.0–46.6)
Hemoglobin: 12 g/dL (ref 11.1–15.9)
MCH: 30.2 pg (ref 26.6–33.0)
MCHC: 32.9 g/dL (ref 31.5–35.7)
MCV: 92 fL (ref 79–97)
Platelets: 197 10*3/uL (ref 150–450)
RBC: 3.97 x10E6/uL (ref 3.77–5.28)
RDW: 13.6 % (ref 12.3–15.4)
WBC: 4.5 10*3/uL (ref 3.4–10.8)

## 2017-11-03 LAB — LIPID PANEL
CHOL/HDL RATIO: 1.8 ratio (ref 0.0–4.4)
CHOLESTEROL TOTAL: 174 mg/dL (ref 100–199)
HDL: 97 mg/dL (ref >39)
LDL Calculated: 69 mg/dL (ref 0–99)
TRIGLYCERIDES: 39 mg/dL (ref 0–149)
VLDL Cholesterol Cal: 8 mg/dL (ref 5–40)

## 2017-11-03 LAB — TSH: TSH: 2.15 u[IU]/mL (ref 0.450–4.500)

## 2017-11-05 ENCOUNTER — Ambulatory Visit (INDEPENDENT_AMBULATORY_CARE_PROVIDER_SITE_OTHER): Payer: Medicare Other | Admitting: Physician Assistant

## 2017-11-05 ENCOUNTER — Encounter: Payer: Self-pay | Admitting: Physician Assistant

## 2017-11-05 VITALS — BP 150/72 | HR 63 | Ht 64.0 in | Wt 138.2 lb

## 2017-11-05 DIAGNOSIS — I2510 Atherosclerotic heart disease of native coronary artery without angina pectoris: Secondary | ICD-10-CM | POA: Diagnosis not present

## 2017-11-05 DIAGNOSIS — Z720 Tobacco use: Secondary | ICD-10-CM | POA: Diagnosis not present

## 2017-11-05 DIAGNOSIS — I2583 Coronary atherosclerosis due to lipid rich plaque: Secondary | ICD-10-CM | POA: Diagnosis not present

## 2017-11-05 DIAGNOSIS — I10 Essential (primary) hypertension: Secondary | ICD-10-CM | POA: Diagnosis not present

## 2017-11-05 DIAGNOSIS — E785 Hyperlipidemia, unspecified: Secondary | ICD-10-CM | POA: Diagnosis not present

## 2017-11-05 NOTE — Patient Instructions (Signed)
Medication Instructions:  Your physician recommends that you continue on your current medications as directed. Please refer to the Current Medication list given to you today.  Follow-Up: Your physician wants you to follow-up in: 6 months with Dr. Kelly.  You will receive a reminder letter in the mail two months in advance. If you don't receive a letter, please call our office to schedule the follow-up appointment.      If you need a refill on your cardiac medications before your next appointment, please call your pharmacy.   

## 2017-11-05 NOTE — Progress Notes (Signed)
Cardiology Office Note    Date:  11/07/2017   ID:  Judith Stevens, DOB Oct 11, 1940, MRN [id number]  PCP:  Richmond Campbell., PA-C  Cardiologist:  Dr. Tresa Endo   Chief Complaint  Patient presents with  . Follow-up    seen for Dr. Tresa Endo.     History of Present Illness:  Judith Stevens is a 77 y.o. female with PMH of CAD, GERD, HTN, HLD, tobacco abuse and h/o PE.  She had a cardiac catheterization in [place] around 2001 which revealed 60% ostial LAD lesion.  Repeat cardiac catheterization in 2009 did not show significant interval changes.  Myoview in 2015 was unchanged with normal perfusion.  Last echocardiogram obtained on 04/29/2016 showed EF 55 to 60%, mild AI, mildly dilated left atrium.  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  FFR was negative.  Patient presents today for six-month follow-up.  Recent lipid panel obtained 2 days ago showed total cholesterol 174, HDL 97, LDL 69, triglyceride 39.  Patient presents today for cardiology office visit.  She occasionally have a right-sided twinge that only last a few seconds.  This does not occur with exertion and appears to be quite rare.  Otherwise, she continued to have occasional dyspnea, this too, does not have clear correlation with exertion.  At this point, I do not recommend any further work-up.  She does not have any lower extremity edema, orthopnea or PND.  Past Medical History:  Diagnosis Date  . Anxiety   . Arthritis   . Asthma   . CAD (coronary artery disease)    single vessel  . GERD (gastroesophageal reflux disease)   . Headache   . History of stress test 10/2011   Resting images reveal a normal pattern of perfusion in all regions. The post  stress myocardial perfusion images show a normal pattern of perfusion in all region. The post stress left ventricle is normal size. the rest left ventricle is normal size, there is no scintigraphic evidence of inductible myocardial ischemia.  Marland Kitchen Hx of echocardiogram 09/2009   EF >55% Normal Echo  . Hyperlipidemia   . Hypertension   . Migraines   . Pneumonia    hx of  . Pulmonary emboli (HCC) 5/12  . Shortness of breath dyspnea    exertion or eating something shes allergic too  . Tobacco abuse     Past Surgical History:  Procedure Laterality Date  . ADENOIDECTOMY    . BACK SURGERY  2012  . CARDIAC CATHETERIZATION  10/13/2007   60% narrowing twin LAD system  . FOOT SURGERY  2002,2013   right  . KNEE SURGERY  01/17/2007   right  . SACROILIAC JOINT FUSION Right 03/28/2014   Procedure: SACROILIAC JOINT FUSION;  Surgeon: Emilee Hero, MD;  Location: [facility] OR;  Service: Orthopedics;  Laterality: Right;  Right sided sacroliac joint fusion  . TONSILLECTOMY      Current Medications: Outpatient Medications Prior to Visit  Medication Sig Dispense Refill  . ALPRAZolam (XANAX) 0.5 MG tablet Take 0.5 mg by mouth at bedtime as needed for anxiety.    Marland Kitchen atorvastatin (LIPITOR) 80 MG tablet Take 1 tablet (80 mg total) by mouth daily. 90 tablet 1  .  budesonide (PULMICORT) 0.5 MG/2ML nebulizer solution USE AS DIRECTED FOR NASAL RINSE 120 mL 0  . butalbital-acetaminophen-caffeine (FIORICET WITH CODEINE) 50-325-40-30 MG per capsule Take 1 capsule by mouth daily as needed for headache.   5  . Cholecalciferol (VITAMIN D3) 2000 UNITS TABS Take 2,000 Units by mouth daily.     Marland Kitchen diltiazem (CARDIZEM CD) 300 MG 24 hr capsule Take 1 capsule (300 mg total) by mouth at bedtime. 90 capsule 3  . escitalopram (LEXAPRO) 20 MG tablet Take 1 tablet (20 mg total) by mouth daily. 90 tablet 0  . ezetimibe (ZETIA) 10 MG tablet Take 1 tablet (10 mg total) by mouth daily. 90 tablet 1  . Fluticasone-Salmeterol (ADVAIR  DISKUS) 250-50 MCG/DOSE AEPB Inhale 1 puff into the lungs 2 (two) times daily.    . Multiple Vitamin (MULTI-VITAMIN DAILY PO) Take 1 tablet by mouth daily.    . nitroGLYCERIN (NITROSTAT) 0.4 MG SL tablet Place 1 tablet (0.4 mg total) under the tongue every 5 (five) minutes as needed for chest pain. Needs appointment for future refills 25 tablet 0  . ondansetron (ZOFRAN) 4 MG tablet Take 4 mg by mouth every 8 (eight) hours as needed for nausea.    . pantoprazole (PROTONIX) 40 MG tablet Take 1 tablet by mouth 2 (two) times daily.    Marland Kitchen Phenylephrine HCl ([employer] SINUS PE DECONGESTANT PO) Take by mouth.    . predniSONE (DELTASONE) 10 MG tablet Take 3 tablets PO for 3 days, then take 2 tablets PO for 3 days, then take 1 tablet PO for 3 days.    . terbutaline (BRETHINE) 5 MG tablet Take 5 mg by mouth every 6 (six) hours as needed (asthma).     . VENTOLIN HFA 108 (90 Base) MCG/ACT inhaler INHALE 2 PUFFS INTO THE LUNGS EVERY 6 (SIX) HOURS AS NEEDED FOR WHEEZING.  11  . metoprolol tartrate (LOPRESSOR) 50 MG tablet Take 50 mg (1 tablet) one hour prior to Cardiac CT 1 tablet 0  . pantoprazole (PROTONIX) 40 MG tablet Take 80 mg by mouth 2 (two) times daily.      No facility-administered medications prior to visit.      Allergies:   Cefzil [cefprozil]; Keflex [cephalexin]; Lactose intolerance (gi); Levaquin [levofloxacin]; Naproxen; Oxycodone-acetaminophen; Peanuts [peanut oil]; Penicillins; Percocet [oxycodone-acetaminophen]; Red dye; Sulfa antibiotics; Sulfacetamide sodium; Tetracycline hcl; Tetracyclines & related; Methocarbamol; Tomato; Amoxicillin-pot clavulanate; Ibuprofen; Moxifloxacin; Penicillin g; Sulfamethoxazole-trimethoprim; Tetracycline; and Tramadol   Social History   Socioeconomic History  . Marital status: Widowed    Spouse name: Not on file  . Number of children: Not on file  . Years of education: Not on file  . Highest education level: Not on file  Occupational History  . Not on file    Social Needs  . Financial resource strain: Not on file  . Food insecurity:    Worry: Not on file    Inability: Not on file  . Transportation needs:    Medical: Not on file    Non-medical: Not on file  Tobacco Use  . Smoking status: Former Smoker    Packs/day: 0.00    Types: Cigarettes    Start date: 01/30/2014  . Smokeless tobacco: Former Neurosurgeon    Quit date: 01/29/2014  Substance and Sexual Activity  . Alcohol use: Yes    Alcohol/week: 4.0 standard drinks    Types: 2 Glasses of wine, 2 Cans of beer per week  . Drug use: No  . Sexual activity: Not on file  Lifestyle  .  Physical activity:    Days per week: Not on file    Minutes per session: Not on file  . Stress: Not on file  Relationships  . Social connections:    Talks on phone: Not on file    Gets together: Not on file    Attends religious service: Not on file    Active member of club or organization: Not on file    Attends meetings of clubs or organizations: Not on file    Relationship status: Not on file  Other Topics Concern  . Not on file  Social History Narrative  . Not on file     Family History:  The patient's family history includes Asthma in her mother; Diabetes in her mother and sister; Heart attack in her father and mother; Hyperlipidemia in her sister.   ROS:   Please see the history of present illness.    ROS All other systems reviewed and are negative.   PHYSICAL EXAM:   VS:  BP (!) 150/72   Pulse 63   Ht 5\' 4"  (1.626 m)   Wt 138 lb 3.2 oz (62.7 kg)   LMP  (LMP Unknown)   SpO2 99%   BMI 23.72 kg/m    GEN: Well nourished, well developed, in no acute distress  HEENT: normal  Neck: no JVD, carotid bruits, or masses Cardiac: RRR; no murmurs, rubs, or gallops,no edema  Respiratory:  clear to auscultation bilaterally, normal work of breathing GI: soft, nontender, nondistended, + BS MS: no deformity or atrophy  Skin: warm and dry, no rash Neuro:  Alert and Oriented x 3, Strength and sensation  are intact Psych: euthymic mood, full affect  Wt Readings from Last 3 Encounters:  11/05/17 138 lb 3.2 oz (62.7 kg)  05/04/17 136 lb 12.8 oz (62.1 kg)  02/09/17 137 lb (62.1 kg)      Studies/Labs Reviewed:   EKG:  EKG is ordered today.  The ekg ordered today demonstrates normal sinus rhythm, no significant ST-T wave changes.  Recent Labs: 11/03/2017: ALT 22; BUN 14; Creatinine, Ser 0.54; Hemoglobin 12.0; Platelets 197; Potassium 4.9; Sodium 142; TSH 2.150   Lipid Panel    Component Value Date/Time   CHOL 174 11/03/2017 0835   TRIG 39 11/03/2017 0835   HDL 97 11/03/2017 0835   CHOLHDL 1.8 11/03/2017 0835   CHOLHDL 2.0 04/27/2016 0923   VLDL 10 04/27/2016 0923   LDLCALC 69 11/03/2017 0835    Additional studies/ records that were reviewed today include:   Echo 04/29/2016 LV EF: 55% -   60% Study Conclusions  - Left ventricle: The cavity size was normal. Systolic function was   normal. The estimated ejection fraction was in the range of 55%   to 60%. Wall motion was normal; there were no regional wall   motion abnormalities. Left ventricular diastolic function   parameters were normal. - Aortic valve: There was mild regurgitation. - Mitral valve: Calcified annulus. Mildly thickened leaflets . - Left atrium: The atrium was mildly dilated. - Atrial septum: No defect or patent foramen ovale was identified.   Coronary CT 03/12/2017 IMPRESSION: 1. Coronary calcium score of 881. This was 34 percentile for age and sex matched control.  2. 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 Right dominance.  3. Diffuse calcified plaque in the RCA and proximal LAD. Given significant calcifications, additional analysis with CT FFR will be submitted. IMPRESSION: 1.  CT FFR analysis didn't show  any significant stenosis.  ASSESSMENT:    1. Coronary artery disease involving native coronary artery of native heart without angina  pectoris   2. Essential hypertension   3. Hyperlipidemia, unspecified hyperlipidemia type   4. Tobacco abuse      PLAN:  In order of problems listed above:  1. CAD: History of nonobstructive disease.  A coronary CT in December 2018 showed moderate LAD disease, FFR was negative.  Continue Lipitor.  2. Hypertension: Blood pressure elevated, however according to the patient, her blood pressure is usually quite well controlled at home.  We will hold off on increasing blood pressure medication  3. Hyperlipidemia: On Lipitor, recent lipid panel well controlled  4. Tobacco abuse: Importance of tobacco cessation discussed.    Medication Adjustments/Labs and Tests Ordered: Current medicines are reviewed at length with the patient today.  Concerns regarding medicines are outlined above.  Medication changes, Labs and Tests ordered today are listed in the Patient Instructions below. Patient Instructions  Medication Instructions: Your physician recommends that you continue on your current medications as directed. Please refer to the Current Medication list given to you today.   Follow-Up: Your physician wants you to follow-up in: 6 months with Dr. Tresa EndoKelly. You will receive a reminder letter in the mail two months in advance. If you don't receive a letter, please call our office to schedule the follow-up appointment.  If you need a refill on your cardiac medications before your next appointment, please call your pharmacy.     Ramond DialSigned, Moe Brier, [place]  11/07/2017 11:54 PM    [facility] [address], [place], [place]  [address] Phone: [phone]) [phone]; Fax: [phone]

## 2017-11-07 ENCOUNTER — Encounter: Payer: Self-pay | Admitting: Physician Assistant

## 2017-11-23 ENCOUNTER — Ambulatory Visit: Payer: Medicare Other | Admitting: Physician Assistant

## 2017-12-27 ENCOUNTER — Telehealth: Payer: Self-pay | Admitting: Cardiovascular Disease

## 2017-12-27 MED ORDER — ATORVASTATIN CALCIUM 80 MG PO TABS: 80.0000 mg | ORAL_TABLET | Freq: Every day | ORAL | 3 refills | Status: DC

## 2017-12-27 MED ORDER — EZETIMIBE 10 MG PO TABS: 10.0000 mg | ORAL_TABLET | Freq: Every day | ORAL | 3 refills | Status: DC

## 2017-12-27 NOTE — Telephone Encounter (Signed)
Spoke with pt and informed that her a new prescription will be sent for her Zetia and Atorvastatin. Verbalized understanding.

## 2017-12-27 NOTE — Telephone Encounter (Signed)
New Message         *STAT* If patient is at the pharmacy, call can be transferred to refill team.   1. Which medications need to be refilled? (please list name of each medication and dose if known) Atorvastatin 80 mg/Etimive 10mg   2. Which pharmacy/location (including street and city if local pharmacy) is medication to be sent to?Meds by mail  3. Do they need a 30 day or 90 day supply? 90

## 2018-01-07 ENCOUNTER — Encounter

## 2018-01-11 ENCOUNTER — Ambulatory Visit: Payer: Medicare Other | Admitting: Cardiovascular Disease

## 2018-04-08 ENCOUNTER — Ambulatory Visit: Payer: Medicare Other | Admitting: Cardiovascular Disease

## 2018-04-26 ENCOUNTER — Ambulatory Visit (INDEPENDENT_AMBULATORY_CARE_PROVIDER_SITE_OTHER): Payer: Medicare Other | Admitting: Cardiovascular Disease

## 2018-04-26 ENCOUNTER — Encounter: Payer: Self-pay | Admitting: Cardiovascular Disease

## 2018-04-26 VITALS — BP 150/76 | HR 68 | Ht 64.0 in | Wt 135.8 lb

## 2018-04-26 DIAGNOSIS — Z720 Tobacco use: Secondary | ICD-10-CM

## 2018-04-26 DIAGNOSIS — F329 Major depressive disorder, single episode, unspecified: Secondary | ICD-10-CM

## 2018-04-26 DIAGNOSIS — F32A Depression, unspecified: Secondary | ICD-10-CM

## 2018-04-26 DIAGNOSIS — F419 Anxiety disorder, unspecified: Secondary | ICD-10-CM

## 2018-04-26 DIAGNOSIS — Z79899 Other long term (current) drug therapy: Secondary | ICD-10-CM | POA: Diagnosis not present

## 2018-04-26 DIAGNOSIS — E785 Hyperlipidemia, unspecified: Secondary | ICD-10-CM | POA: Diagnosis not present

## 2018-04-26 DIAGNOSIS — I2510 Atherosclerotic heart disease of native coronary artery without angina pectoris: Secondary | ICD-10-CM | POA: Diagnosis not present

## 2018-04-26 DIAGNOSIS — I10 Essential (primary) hypertension: Secondary | ICD-10-CM

## 2018-04-26 MED ORDER — EZETIMIBE 10 MG PO TABS: 10.0000 mg | ORAL_TABLET | Freq: Every day | ORAL | 3 refills | Status: DC

## 2018-04-26 MED ORDER — OLMESARTAN MEDOXOMIL 20 MG PO TABS: 20.0000 mg | ORAL_TABLET | Freq: Every day | ORAL | 2 refills | Status: DC

## 2018-04-26 MED ORDER — NITROGLYCERIN 0.4 MG SL SUBL: 0.4000 mg | SUBLINGUAL_TABLET | SUBLINGUAL | 0 refills | Status: DC | PRN

## 2018-04-26 MED ORDER — ATORVASTATIN CALCIUM 80 MG PO TABS: 80.0000 mg | ORAL_TABLET | Freq: Every day | ORAL | 3 refills | Status: DC

## 2018-04-26 MED ORDER — DILTIAZEM HCL ER COATED BEADS 300 MG PO CP24: 300.0000 mg | ORAL_CAPSULE | Freq: Every day | ORAL | 3 refills | Status: DC

## 2018-04-26 NOTE — Patient Instructions (Signed)
Medication Instructions:  Start Olmesartan 20 mg daily.  If you need a refill on your cardiac medications before your next appointment, please call your pharmacy.   Lab work: Fasting labs: CMET, LIPID 3-4 weeks. If you have labs (blood work) drawn today and your tests are completely normal, you will receive your results only by: Marland Kitchen MyChart Message (if you have MyChart) OR . A paper copy in the mail If you have any lab test that is abnormal or we need to change your treatment, we will call you to review the results.  Follow-Up: At [facility], you and your health needs are our priority.  As part of our continuing mission to provide you with exceptional heart care, we have created designated Provider Care Teams.  These Care Teams include your primary Cardiologist (physician) and Advanced Practice Providers (APPs -  Physician Assistants and Nurse Practitioners) who all work together to provide you with the care you need, when you need it. You will need a follow up appointment in 3-4 months. You may see Dr.Kelly or one of the following Advanced Practice Providers on your designated Care Team: Azalee Course, [place] . Micah Flesher, PA-C

## 2018-04-26 NOTE — Progress Notes (Signed)
Patient ID: Judith Stevens, female   DOB: 24-Sep-1940, 78 y.o.   MRN: [id number]     HPI: Judith Stevens is a 78 y.o. female who presents for a 2 month cardiology evaluation.  Judith Stevens has CAD and in 2001 underwent cardiac catheterization while in [place] which showed a 60% ostial narrowing of her LAD. Repeat catheterization by me in 2009 did not show any significant interval change. Unfortunately, she has continued her ongoing tobacco habit but is now smoking significantly reduced at less than one quarter pack per day.  She has a history of hypertension as well as hyperlipidemia.  A nuclear perfusion study in August 2013 which was unchanged from 2 years previously and continued to show normal perfusion.   In December 2015 prior to undergoing right hip surgery a 2 year follow-up nuclear perfusion study on 03/13/2014 was performed preoperatively.  This continued to show normal perfusion without scar or ischemia.  She tolerated her hip surgery well.  She had smoked for 50 years before finally quitting smoking when I had seen her last.  When I last saw he 2 months ago, she had complained of experiencing increasing shortness of breath of 6 weeks duration that was not consistently exertionally precipitated.   She admitted to fatigue.  She is unaware of palpitations.  She has hyperlipidemia and has been taking atorvastatin 80 mg and Zetia 10 mg in attempt to continue to induce plaque regression.    An echo Doppler study on 02/18/2015 showed an EF of 55-60%, moderate thickening and calcification of a trileaflet aortic valve, consistent with sclerosis but without stenosis and mitral annular calcification.  Laboratory revealed excellent lipid studies with a total cholesterol 149, triglycerides 55, HDL 85 and her LDL was now 53.  Thyroid function studies were normal.  Her renal function was excellent.  LFTs were normal.  Vitamin D was normal at 41 and her vitamin B-12 level was normal at 448.     She was seen by Judith Stevens in October 2017 with complaints of dyspnea on exertion.  She has a history of asthma as well as numerous allergies to foods and some drugs.  She tells me she had the flu in early January 2018.  When I last saw her in January 2018.  She had stop smoking.  She did have complaints of dyspnea.  I recommended a follow-up echo Doppler study which was done on 04/29/2016.  This showed normal LV function and normal diastolic parameters.  The aortic valve was mildly calcified and was without stenosis.  There was mild aortic insufficiency.  Her mitral valve annulus was calcified and leaflets were mildly thickened.  Her left atrium was mildly dilated.   When I saw her in November 2018 she had been taking caring for her sister who had undergone aortic stent grafting.  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  When I saw her, I recommended complete smoking cessation and suggested a pulmonary evaluation with Judith Stevens.   She underwent a coronary CT, which showed a calcium score of 81 which was 71 percentile for age and sex matched control.  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  There is right dominance.  There was diffuse calcified plaque in the RCA and proximal LAD with narrowing of 25-50%.  7.  She underwent FFR analysis with both lesions being in the normal FFR range of greater  than 0.80.  She has significantly reduced her tobacco and now smokes very intermittently.   I last saw her in May 04, 2017 currently at that time it appeared that she was on both low-dose amlodipine in addition to diltiazem.  I discontinued amlodipine and further titrated diltiazem.  Over the past year she has done fairly well.  She had seen Judith Russian, PA C in August 2019.  She had experienced occasional twinges of chest pain.  Admits to having a very stressful weekend.  She is still  intermittently snoring.  She tells me she will be undergoing right cataract surgery next month and remotely had undergone left cataract surgery several years ago.  She presents for evaluation.  Past Medical History:  Diagnosis Date  . Anxiety   . Arthritis   . Asthma   . CAD (coronary artery disease)    single vessel  . GERD (gastroesophageal reflux disease)   . Headache   . History of stress test 10/2011   Resting images reveal a normal pattern of perfusion in all regions. The post stress myocardial perfusion images show a normal pattern of perfusion in all region. The post stress left ventricle is normal size. the rest left ventricle is normal size, there is no scintigraphic evidence of inductible myocardial ischemia.  Marland Kitchen Hx of echocardiogram 09/2009   EF >55% Normal Echo  . Hyperlipidemia   . Hypertension   . Migraines   . Pneumonia    hx of  . Pulmonary emboli ([place]) 5/12  . Shortness of breath dyspnea    exertion or eating something shes allergic too  . Tobacco abuse     Past Surgical History:  Procedure Laterality Date  . ADENOIDECTOMY    . BACK SURGERY  2012  . CARDIAC CATHETERIZATION  10/13/2007   60% narrowing twin LAD system  . FOOT SURGERY  2002,2013   right  . KNEE SURGERY  01/17/2007   right  . SACROILIAC JOINT FUSION Right 03/28/2014   Procedure: SACROILIAC JOINT FUSION;  Surgeon: Sinclair Ship, MD;  Location: [facility];  Service: Orthopedics;  Laterality: Right;  Right sided sacroliac joint fusion  . TONSILLECTOMY      Allergies  Allergen Reactions  . Cefzil [Cefprozil] Shortness Of Breath  . Keflex [Cephalexin] Shortness Of Breath and Rash  . Lactose Intolerance (Gi) Shortness Of Breath    ALL DAIRY PRODUCTS  . Levaquin [Levofloxacin] Shortness Of Breath and Rash  . Naproxen Shortness Of Breath    Shortness of breath Shortness of breath  . Oxycodone-Acetaminophen Nausea And Vomiting and Shortness Of Breath  . Peanuts [Peanut Oil] Shortness Of  Breath  . Penicillins Shortness Of Breath  . Percocet [Oxycodone-Acetaminophen] Shortness Of Breath  . Red Dye Shortness Of Breath  . Sulfa Antibiotics Shortness Of Breath  . Sulfacetamide Sodium Shortness Of Breath  . Tetracycline Hcl Shortness Of Breath  . Tetracyclines & Related Shortness Of Breath  . Methocarbamol Nausea And Vomiting  . Prednisone Other (See Comments)    Rash  . Tomato Other (See Comments)    SOB  . Amoxicillin-Pot Clavulanate Rash  . Ibuprofen Rash  . Moxifloxacin Rash  . Penicillin G Rash  . Sulfamethoxazole-Trimethoprim Rash  . Tetracycline Rash  . Tramadol Rash    Current Outpatient Medications  Medication Sig Dispense Refill  . ALPRAZolam (XANAX) 1 MG tablet TAKE 1 TABLET UP TO THREE TIMES PER DAY FOR ANXIETY    . atorvastatin (LIPITOR) 80 MG tablet Take 1 tablet (80  mg total) by mouth daily. 90 tablet 3  . budesonide (PULMICORT) 0.5 MG/2ML nebulizer solution USE AS DIRECTED FOR NASAL RINSE 120 mL 0  . butalbital-acetaminophen-caffeine (FIORICET WITH CODEINE) 50-325-40-30 MG per capsule Take 1 capsule by mouth daily as needed for headache.   5  . Cholecalciferol (VITAMIN D3) 2000 UNITS TABS Take 2,000 Units by mouth daily.     Marland Kitchen diltiazem (CARDIZEM CD) 300 MG 24 hr capsule Take 1 capsule (300 mg total) by mouth at bedtime. 90 capsule 3  . escitalopram (LEXAPRO) 20 MG tablet Take 1 tablet (20 mg total) by mouth daily. 90 tablet 0  . ezetimibe (ZETIA) 10 MG tablet Take 1 tablet (10 mg total) by mouth daily. 90 tablet 3  . Fluticasone-Salmeterol (ADVAIR DISKUS) 250-50 MCG/DOSE AEPB Inhale 1 puff into the lungs 2 (two) times daily.    . Multiple Vitamin (MULTI-VITAMIN DAILY PO) Take 1 tablet by mouth daily.    . nitroGLYCERIN (NITROSTAT) 0.4 MG SL tablet Place 1 tablet (0.4 mg total) under the tongue every 5 (five) minutes as needed for chest pain. Needs appointment for future refills 25 tablet 0  . ondansetron (ZOFRAN) 4 MG tablet Take 4 mg by mouth every 8  (eight) hours as needed for nausea.    . pantoprazole (PROTONIX) 40 MG tablet Take 1 tablet by mouth 2 (two) times daily.    Marland Kitchen Phenylephrine HCl ([employer] SINUS PE DECONGESTANT PO) Take by mouth.    . predniSONE (DELTASONE) 10 MG tablet Take 3 tablets PO for 3 days, then take 2 tablets PO for 3 days, then take 1 tablet PO for 3 days.    . terbutaline (BRETHINE) 5 MG tablet Take 5 mg by mouth every 6 (six) hours as needed (asthma).     . VENTOLIN HFA 108 (90 Base) MCG/ACT inhaler INHALE 2 PUFFS INTO THE LUNGS EVERY 6 (SIX) HOURS AS NEEDED FOR WHEEZING.  11  . olmesartan (BENICAR) 20 MG tablet Take 1 tablet (20 mg total) by mouth daily. 90 tablet 2   No current facility-administered medications for this visit.     Social History   Socioeconomic History  . Marital status: Widowed    Spouse name: Not on file  . Number of children: Not on file  . Years of education: Not on file  . Highest education level: Not on file  Occupational History  . Not on file  Social Needs  . Financial resource strain: Not on file  . Food insecurity:    Worry: Not on file    Inability: Not on file  . Transportation needs:    Medical: Not on file    Non-medical: Not on file  Tobacco Use  . Smoking status: Current Some Day Smoker    Packs/day: 0.10    Types: Cigarettes    Start date: 01/30/2014  . Smokeless tobacco: Former Systems developer    Quit date: 01/29/2014  Substance and Sexual Activity  . Alcohol use: Yes    Alcohol/week: 4.0 standard drinks    Types: 2 Glasses of wine, 2 Cans of beer per week  . Drug use: No  . Sexual activity: Not on file  Lifestyle  . Physical activity:    Days per week: Not on file    Minutes per session: Not on file  . Stress: Not on file  Relationships  . Social connections:    Talks on phone: Not on file    Gets together: Not on file    Attends religious service:  Not on file    Active member of club or organization: Not on file    Attends meetings of clubs or organizations: Not  on file    Relationship status: Not on file  . Intimate partner violence:    Fear of current or ex partner: Not on file    Emotionally abused: Not on file    Physically abused: Not on file    Forced sexual activity: Not on file  Other Topics Concern  . Not on file  Social History Narrative  . Not on file    Family History  Problem Relation Age of Onset  . Heart attack Mother   . Diabetes Mother   . Asthma Mother   . Heart attack Father   . Diabetes Sister   . Hyperlipidemia Sister   . Allergic rhinitis Neg Hx   . Angioedema Neg Hx   . Eczema Neg Hx   . Immunodeficiency Neg Hx   . Urticaria Neg Hx    Socially, she is widowed. She has 2 children 5 grandchildren. She does not exercise. She quit smoking 2 years ago. resumed on August 30, 2022 after her dog died.  She is committed to completely discontinue tobacco.   ROS General: Negative; No fevers, chills, or night sweats;  HEENT: Negative; No changes in vision or hearing, sinus congestion, difficulty swallowing Pulmonary: Positive for asthma Cardiovascular: Negative; No chest pain, presyncope, syncope, palpitations GI: positive for GERD; No nausea, vomiting, diarrhea, or abdominal pain GU: Negative; No dysuria, hematuria, or difficulty voiding Musculoskeletal: She tolerated her hip surgery well. Hematologic/Oncology: Negative; no easy bruising, bleeding Endocrine: Negative; no heat/cold intolerance; no diabetes Neuro: Negative; no changes in balance, headaches Skin: Negative; No rashes or skin lesions Psychiatric: Negative; No behavioral problems, depression Sleep: Negative; No snoring, daytime sleepiness, hypersomnolence, bruxism, restless legs, hypnogognic hallucinations, no cataplexy Other comprehensive 14 point system review is negative.  Nutritionally she cannot have dairy products due to significant milk allergy.  PE BP (!) 150/76   Pulse 68   Ht '5\' 4"'  (1.626 m)   Wt 135 lb 12.8 oz (61.6 kg)   LMP  (LMP Unknown)    SpO2 96%   BMI 23.31 kg/m    Repeat blood pressure by me was elevated with supine blood pressure 168/78, and standing blood pressure 156/78.  Wt Readings from Last 3 Encounters:  04/26/18 135 lb 12.8 oz (61.6 kg)  11/05/17 138 lb 3.2 oz (62.7 kg)  05/04/17 136 lb 12.8 oz (62.1 kg)   General: Alert, oriented, no distress.  Skin: normal turgor, no rashes, warm and dry HEENT: Normocephalic, atraumatic. Pupils equal round and reactive to light; sclera anicteric; extraocular muscles intact; left lens implant Nose without nasal septal hypertrophy Mouth/Parynx benign; Mallinpatti scale 3 Neck: No JVD, no carotid bruits; normal carotid upstroke Lungs: clear to ausculatation and percussion; no wheezing or rales Chest wall: without tenderness to palpitation Heart: PMI not displaced, RRR, s1 s2 normal, 1/6 systolic murmur, no diastolic murmur, no rubs, gallops, thrills, or heaves Abdomen: soft, nontender; no hepatosplenomehaly, BS+; abdominal aorta nontender and not dilated by palpation. Back: no CVA tenderness Pulses 2+ Musculoskeletal: full range of motion, normal strength, no joint deformities Extremities: no clubbing cyanosis or edema, Homan's sign negative  Neurologic: grossly nonfocal; Cranial nerves grossly wnl Psychologic: Normal mood and affect   ECG (independently read by me): Normal sinus rhythm at 61 bpm.  No ectopy.  Specific ST change in lead III.  February 2019 ECG (independently read by  me): Normal sinus rhythm at 72 bpm.  Nonspecific ST changes inferiorly.  QTc interval 451 ms.  June 2018 ECG (independently read by me): Sinus bradycardia 57 bpm.  No significant ST-T changes.  Normal intervals.  January 2018 ECG (independently read by me): Normal sinus rhythm at 69 bpm.  No ectopy.  Normal intervals.  November 2016 ECG (independently read by me): Normal sinus rhythm at 60 bpm.  No significant ST-T changes.  Normal intervals.  November 2015 ECG (independently read by me):  Normal sinus rhythm at 72 bpm.  Normal intervals.  No significant ST-T changes.  Prior October 2014 ECG: Sinus rhythm with occasional isolated PVC. Heart rate 68 beats per minute. QTc interval 440 ms.  LAB: BMP Latest Ref Rng & Units 11/03/2017 02/08/2017 04/27/2016  Glucose 65 - 99 mg/dL 95 99 98  BUN 8 - 27 mg/dL '14 14 13  ' Creatinine 0.57 - 1.00 mg/dL 0.54(L) 0.54(L) 0.52(L)  BUN/Creat Ratio 12 - '28 26 26 ' -  Sodium 134 - 144 mmol/L 142 144 141  Potassium 3.5 - 5.2 mmol/L 4.9 4.7 5.1  Chloride 96 - 106 mmol/L 103 104 105  CO2 20 - 29 mmol/L '25 25 31  ' Calcium 8.7 - 10.3 mg/dL 9.3 9.6 9.0   Hepatic Function Latest Ref Rng & Units 11/03/2017 02/08/2017 04/27/2016  Total Protein 6.0 - 8.5 g/dL 6.1 6.6 6.3  Albumin 3.5 - 4.8 g/dL 4.5 4.7 4.1  AST 0 - 40 IU/L '16 19 18  ' ALT 0 - 32 IU/L '22 18 20  ' Alk Phosphatase 39 - 117 IU/L 67 78 71  Total Bilirubin 0.0 - 1.2 mg/dL 0.2 0.3 0.3   CBC Latest Ref Rng & Units 11/03/2017 02/08/2017 04/27/2016  WBC 3.4 - 10.8 x10E3/uL 4.5 6.7 6.6  Hemoglobin 11.1 - 15.9 g/dL 12.0 13.4 11.7  Hematocrit 34.0 - 46.6 % 36.5 39.5 36.2  Platelets 150 - 450 x10E3/uL 197 185 196   Lab Results  Component Value Date   MCV 92 11/03/2017   MCV 92 02/08/2017   MCV 93.5 04/27/2016   Lab Results  Component Value Date   TSH 2.150 11/03/2017   No results found for: HGBA1C   Lipid Panel     Component Value Date/Time   CHOL 174 11/03/2017 0835   TRIG 39 11/03/2017 0835   HDL 97 11/03/2017 0835   CHOLHDL 1.8 11/03/2017 0835   CHOLHDL 2.0 04/27/2016 0923   VLDL 10 04/27/2016 0923   LDLCALC 69 11/03/2017 0835    RADIOLOGY: No results found.  IMPRESSION:  1. Essential hypertension   2. Hyperlipidemia LDL goal <70   3. Medication management   4. Coronary artery disease involving native coronary artery of native heart without angina pectoris   5. Tobacco abuse   6. Anxiety and depression     ASSESSMENT AND PLAN: Ms. Gary is a 78 year old white female who  had smoked for over 50 years and quit smoking 2 years ago.  Unfortunately, she was under significant stress with the  death of her dog suddenly and resumed smoking in May 2018 and at present smokes  intermittently.   She has CAD with a 60% ostial LAD stenosis dating back to 2001 and which again was noted on her repeat catheterization by me in 2009.  We have been aggressively treating her lipids in attempt to induce plaque regression and she is tolerated Lipitor 80 mg in addition to Zetia 10 mg.  Laboratory studies in 2016 showed an LDL of 53.  Most recent lipid studies in August 2019 continues to show good control of lipids with an LDL of 69.   A coronary CT angiography  did not demonstrate significant progression of disease and showed 25-50% stenoses in the LAD and RCA; FFR was negative.  Blood pressure today is elevated despite taking diltiazem 300 mg daily.  I have recommended the addition of olmesartan 20 mg to her medical regimen with target blood pressure less than 130/80.  She has a history of anxiety/depression for which he takes Lexapro.  She takes Advair in addition to Pulmicort for her lung disease as well as as needed terbutaline.  She believes her sublingual nitroglycerin has long expired and I have renewed this today.  Is not having any anginal symptomatology.  In several weeks I have recommended follow-up fasting lipid panel and chemistry profile with the addition of olmesartan.  She will monitor her blood pressure.  I will see her in 3 months for follow-up evaluation or sooner if problems arise.  Time spent: 25 minutes  Troy Sine, MD, [facility]  04/28/2018 12:27 AM

## 2018-04-28 ENCOUNTER — Encounter: Payer: Self-pay | Admitting: Cardiovascular Disease

## 2018-07-26 ENCOUNTER — Telehealth: Payer: Self-pay | Admitting: Cardiovascular Disease

## 2018-07-26 NOTE — Telephone Encounter (Signed)
New Message:     I called pt to see if pt could reschedule her 07-28-18. Pt said she have been having some shortness of breath. She thought it might have been her allergies, but went to her doctor and it was not.So pt needs to keep her appt for Thursday.

## 2018-07-26 NOTE — Telephone Encounter (Signed)
That is fine- just switch her to a virtual visit. Thanks!

## 2018-07-27 ENCOUNTER — Telehealth: Payer: Self-pay | Admitting: Cardiovascular Disease

## 2018-07-27 NOTE — Telephone Encounter (Signed)
Spoke to pt who stated she does have a smartphone for video visit. She also has a bp cuff, pulse ox, and weight scale at home.  Explained to pt Doxy[employer]Me process for video visit.  Pt verbalized understanding and thanks for the call.

## 2018-07-27 NOTE — Telephone Encounter (Signed)
Smartphone/ my chart/ consent/ pre reg completed °

## 2018-07-28 ENCOUNTER — Telehealth (INDEPENDENT_AMBULATORY_CARE_PROVIDER_SITE_OTHER): Payer: Medicare Other | Admitting: Cardiovascular Disease

## 2018-07-28 VITALS — BP 147/70 | HR 72 | Ht 64.0 in | Wt 135.0 lb

## 2018-07-28 DIAGNOSIS — I2510 Atherosclerotic heart disease of native coronary artery without angina pectoris: Secondary | ICD-10-CM | POA: Diagnosis not present

## 2018-07-28 DIAGNOSIS — R0609 Other forms of dyspnea: Secondary | ICD-10-CM

## 2018-07-28 DIAGNOSIS — Z87891 Personal history of nicotine dependence: Secondary | ICD-10-CM | POA: Diagnosis not present

## 2018-07-28 DIAGNOSIS — K219 Gastro-esophageal reflux disease without esophagitis: Secondary | ICD-10-CM

## 2018-07-28 DIAGNOSIS — I10 Essential (primary) hypertension: Secondary | ICD-10-CM | POA: Diagnosis not present

## 2018-07-28 DIAGNOSIS — J45909 Unspecified asthma, uncomplicated: Secondary | ICD-10-CM

## 2018-07-28 DIAGNOSIS — E785 Hyperlipidemia, unspecified: Secondary | ICD-10-CM

## 2018-07-28 DIAGNOSIS — R0602 Shortness of breath: Secondary | ICD-10-CM

## 2018-07-28 NOTE — Progress Notes (Signed)
Virtual Visit via Video Note   This visit type was conducted due to national recommendations for restrictions regarding the COVID-19 Pandemic (e.g. social distancing) in an effort to limit this patient's exposure and mitigate transmission in our community.  Due to her co-morbid illnesses, this patient is at least at moderate risk for complications without adequate follow up.  This format is felt to be most appropriate for this patient at this time.  All issues noted in this document were discussed and addressed.  A limited physical exam was performed with this format.  Please refer to the patient's chart for her consent to telehealth for [facility].   Evaluation Performed:  Follow-up visit  Date:  07/28/2018   ID:  Judith, Stevens 08-05-1940, MRN [id number]  Patient Location: Home Provider Location: Home  PCP:  Richmond Campbell., PA-C  Cardiologist:  Nicki Guadalajara, MD Electrophysiologist:  None   Chief Complaint:  4 month F/U  History of Present Illness:    Judith Stevens is a 78 y.o. female with Ms. Markiewicz has CAD and in 2001 underwent cardiac catheterization while in [place] which showed a 60% ostial narrowing of her LAD. Repeat catheterization by me in 2009 did not show any significant interval change. Unfortunately, she has continued her ongoing tobacco habit but is now smoking significantly reduced at less than one quarter pack per day.  She has a history of hypertension as well as hyperlipidemia.  A nuclear perfusion study in August 2013 which was unchanged from 2 years previously and continued to show normal perfusion.   In December 2015 prior to undergoing right hip surgery a 2 year follow-up nuclear perfusion study on 03/13/2014 was performed preoperatively.  This continued to show normal perfusion without scar or ischemia.  She tolerated her hip surgery well.  She had smoked for 50 years before finally quitting smoking when I had seen her last.  When I last  saw he 2 months ago, she had complained of experiencing increasing shortness of breath of 6 weeks duration that was not consistently exertionally precipitated.   She admitted to fatigue.  She is unaware of palpitations.  She has hyperlipidemia and has been taking atorvastatin 80 mg and Zetia 10 mg in attempt to continue to induce plaque regression.    An echo Doppler study on 02/18/2015 showed an EF of 55-60%, moderate thickening and calcification of a trileaflet aortic valve, consistent with sclerosis but without stenosis and mitral annular calcification.  Laboratory revealed excellent lipid studies with a total cholesterol 149, triglycerides 55, HDL 85 and her LDL was now 53.  Thyroid function studies were normal.  Her renal function was excellent.  LFTs were normal.  Vitamin D was normal at 41 and her vitamin B-12 level was normal at 448.    She was seen by Theodore Demark in October 2017 with complaints of dyspnea on exertion.  She has a history of asthma as well as numerous allergies to foods and some drugs.  She tells me she had the flu in early January 2018.  When I last saw her in January 2018.  She had stop smoking.  She did have complaints of dyspnea.  I recommended a follow-up echo Doppler study which was done on 04/29/2016.  This showed normal LV function and normal diastolic parameters.  The aortic valve was mildly calcified and was without stenosis.  There was mild aortic insufficiency.  Her mitral valve annulus was calcified and leaflets were mildly thickened.  Her  left atrium was mildly dilated.   When I saw her in November 2018 she had been taking caring for her sister who had undergone aortic stent grafting.  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  When I saw her, I recommended complete smoking cessation and suggested a pulmonary evaluation with Dr. Kathrin PennerMcQuade.   She underwent a coronary CT, which showed a calcium score of 81 which was 93 percentile for age and  sex matched control.  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  There is right dominance.  There was diffuse calcified plaque in the RCA and proximal LAD with narrowing of 25-50%.  7.  She underwent FFR analysis with both lesions being in the normal FFR range of greater than 0.80.  She has significantly reduced her tobacco and now smokes very intermittently.   I  saw her in May 04, 2017 currently at that time it appeared that she was on both low-dose amlodipine in addition to diltiazem.  I discontinued amlodipine and further titrated diltiazem.    She saw Azalee CourseHao Meng, [place] C in August 2019.    I last saw her on April 26, 2018 .  At that time, she was experiencing occasional twinges of chest discomfort but admitted that these seem to be more stress mediated.   She is still intermittently snoring.   Since I last saw her, she has continued to do well.  She has tolerated the addition of olmesartan 20 mg to take with her diltiazem.  Her blood pressure at home typically has run in the 120s over 60s to less than 130.  She has had some issues with mild shortness of breath.  She saw an allergist.  She also was sent to see a GI doctor because typically the shortness of breath seems to occur after she eats certain foods.  The plan was to have an endoscopy but this is been on hold with COVID-19 pandemic.  She had been evaluated by Dr. Audley HoseHong.  She has tolerated the addition of Zetia 10 mg to her atorvastatin.  She never had her follow-up laboratory which is still on order.  She finally quit smoking last cigarette the beginning of March.  She denies any anginal type symptoms.  She presents for reevaluation.    The patient does not have symptoms concerning for COVID-19 infection (fever, chills, cough, or new shortness of breath).    Past Medical History:  Diagnosis Date  . Anxiety   . Arthritis   . Asthma   . CAD (coronary  artery disease)    single vessel  . GERD (gastroesophageal reflux disease)   . Headache   . History of stress test 10/2011   Resting images reveal a normal pattern of perfusion in all regions. The post stress myocardial perfusion images show a normal pattern of perfusion in all region. The post stress left ventricle is normal size. the rest left ventricle is normal size, there is no scintigraphic evidence of inductible myocardial ischemia.  Marland Kitchen. Hx of echocardiogram 09/2009   EF >55% Normal Echo  . Hyperlipidemia   . Hypertension   . Migraines   . Pneumonia    hx of  . Pulmonary emboli (HCC) 5/12  . Shortness of breath dyspnea    exertion or eating something shes allergic too  . Tobacco abuse    Past Surgical History:  Procedure Laterality Date  . ADENOIDECTOMY    .  BACK SURGERY  2012  . CARDIAC CATHETERIZATION  10/13/2007   60% narrowing twin LAD system  . FOOT SURGERY  2002,2013   right  . KNEE SURGERY  01/17/2007   right  . SACROILIAC JOINT FUSION Right 03/28/2014   Procedure: SACROILIAC JOINT FUSION;  Surgeon: Emilee Hero, MD;  Location: [facility] OR;  Service: Orthopedics;  Laterality: Right;  Right sided sacroliac joint fusion  . TONSILLECTOMY       Current Meds  Medication Sig  . ALPRAZolam (XANAX) 1 MG tablet TAKE 1 TABLET UP TO THREE TIMES PER DAY FOR ANXIETY  . atorvastatin (LIPITOR) 80 MG tablet Take 1 tablet (80 mg total) by mouth daily.  . budesonide (PULMICORT) 0.5 MG/2ML nebulizer solution USE AS DIRECTED FOR NASAL RINSE  . butalbital-acetaminophen-caffeine (FIORICET WITH CODEINE) 50-325-40-30 MG per capsule Take 1 capsule by mouth daily as needed for headache.   . Cholecalciferol (VITAMIN D3) 2000 UNITS TABS Take 2,000 Units by mouth daily.   Marland Kitchen diltiazem (CARDIZEM CD) 300 MG 24 hr capsule Take 1 capsule (300 mg total) by mouth at bedtime.  Marland Kitchen escitalopram (LEXAPRO) 20 MG tablet Take 1 tablet (20 mg total) by mouth daily.  Marland Kitchen ezetimibe (ZETIA) 10 MG tablet Take 1  tablet (10 mg total) by mouth daily.  . Fluticasone-Salmeterol (ADVAIR DISKUS) 250-50 MCG/DOSE AEPB Inhale 1 puff into the lungs 2 (two) times daily.  . Multiple Vitamin (MULTI-VITAMIN DAILY PO) Take 1 tablet by mouth daily.  . nitroGLYCERIN (NITROSTAT) 0.4 MG SL tablet Place 1 tablet (0.4 mg total) under the tongue every 5 (five) minutes as needed for chest pain. Needs appointment for future refills  . olmesartan (BENICAR) 20 MG tablet Take 1 tablet (20 mg total) by mouth daily.  . ondansetron (ZOFRAN) 4 MG tablet Take 4 mg by mouth every 8 (eight) hours as needed for nausea.  . pantoprazole (PROTONIX) 40 MG tablet Take 1 tablet by mouth 2 (two) times daily.  Marland Kitchen Phenylephrine HCl ([employer] SINUS PE DECONGESTANT PO) Take by mouth.  . predniSONE (DELTASONE) 10 MG tablet Take 3 tablets PO for 3 days, then take 2 tablets PO for 3 days, then take 1 tablet PO for 3 days.  . terbutaline (BRETHINE) 5 MG tablet Take 5 mg by mouth every 6 (six) hours as needed (asthma).   . VENTOLIN HFA 108 (90 Base) MCG/ACT inhaler INHALE 2 PUFFS INTO THE LUNGS EVERY 6 (SIX) HOURS AS NEEDED FOR WHEEZING.     Allergies:   Cefzil [cefprozil]; Keflex [cephalexin]; Lactose intolerance (gi); Levaquin [levofloxacin]; Naproxen; Oxycodone-acetaminophen; Peanuts [peanut oil]; Penicillins; Percocet [oxycodone-acetaminophen]; Red dye; Sulfa antibiotics; Sulfacetamide sodium; Tetracycline hcl; Tetracyclines & related; Methocarbamol; Prednisone; Tomato; Amoxicillin-pot clavulanate; Ibuprofen; Moxifloxacin; Penicillin g; Sulfamethoxazole-trimethoprim; Tetracycline; and Tramadol   Social History   Tobacco Use  . Smoking status: Current Some Day Smoker    Packs/day: 0.10    Types: Cigarettes    Start date: 01/30/2014  . Smokeless tobacco: Former Neurosurgeon    Quit date: 01/29/2014  Substance Use Topics  . Alcohol use: Yes    Alcohol/week: 4.0 standard drinks    Types: 2 Glasses of wine, 2 Cans of beer per week  . Drug use: No     Family  Hx: The patient's family history includes Asthma in her mother; Diabetes in her mother and sister; Heart attack in her father and mother; Hyperlipidemia in her sister. There is no history of Allergic rhinitis, Angioedema, Eczema, Immunodeficiency, or Urticaria.  ROS:   Please see the history  of present illness.    No fevers chills or night sweats No cough Mild shortness of breath with activity History of mild asthma No anginal type symptoms No palpitations, presyncope or syncope GERD No bleeding History of cataracts status post surgery History of mild anxiety Sleeping well All other systems reviewed and are negative.   Prior CV studies:   The following studies were reviewed today:  I reviewed her last echo Doppler study in January 2018 and her last nuclear stress test in 2015  Labs/Other Tests and Data Reviewed:    EKG:  An ECG dated 04/26/2018 was personally reviewed today and demonstrated:  Normal sinus rhythm at 61 bpm.  No ectopy.  Specific ST change in lead III.  Recent Labs: 11/03/2017: ALT 22; BUN 14; Creatinine, Ser 0.54; Hemoglobin 12.0; Platelets 197; Potassium 4.9; Sodium 142; TSH 2.150   Recent Lipid Panel Lab Results  Component Value Date/Time   CHOL 174 11/03/2017 08:35 AM   TRIG 39 11/03/2017 08:35 AM   HDL 97 11/03/2017 08:35 AM   CHOLHDL 1.8 11/03/2017 08:35 AM   CHOLHDL 2.0 04/27/2016 09:23 AM   LDLCALC 69 11/03/2017 08:35 AM    Wt Readings from Last 3 Encounters:  07/28/18 135 lb (61.2 kg)  04/26/18 135 lb 12.8 oz (61.6 kg)  11/05/17 138 lb 3.2 oz (62.7 kg)     Objective:    Vital Signs:  BP (!) 147/70   Pulse 72   Ht  (1.626 m)   Wt 135 lb (61.2 kg)   LMP  (LMP Unknown)   SpO2 98%   BMI 23.17 kg/m    She states her blood pressure typically runs around 120/63. She had taken it earlier this morning after she had walked when she was mildly short of breath and it was 147/70  She is well-developed and well-nourished in no acute distress.  Breathing was normal and nonlabored She appears euvolemic HEENT was unremarkable.  She needs new glasses There is no JVD There was no wheezing that was audible She denied any chest pressure to palpation Her abdomen was nontender by her assessment She denied any leg swelling She appeared neurologically intact She had a normal affect and mood  ASSESSMENT & PLAN:    1. CAD: Documented at initial catheterization in 2001 which showed 60% ostial LAD stenosis.  Repeat catheterization in 2009 was unchanged.  She is not having anginal symptoms.  Her last nuclear stress test showed normal perfusion 2. Essential hypertension: She now is on olmesartan 20 mg in the morning and diltiazem 300 mg at bedtime.  With the addition of therapy her blood pressure has improved and by her report typically is running in the 120s over 60s. 3. Hyperlipidemia: She now is on atorvastatin 80 mg and Zetia 10 mg.  Target LDL less than 70.  She has not yet had a follow-up laboratory.  Ultimately this will be done in the office for reassessment. 4. Longstanding tobacco history: I commended her on finally quitting in early March 2020. 5. Mild shortness of breath: Most likely pulmonary etiology.  The patient is on Pulmicort and Advair.  Recently her medication changed from Advair by her [place] [employer] but she is going to resume taking her prior Advair dose even if she has to pay more since it seems to have worked better. 6. GERD: She is being evaluated by Dr. Margo Aye and also had been tentatively scheduled for endoscopy which is currently on hold. 7. History of allergies: She had recently  been evaluated by an allergist  COVID-19 Education: The signs and symptoms of COVID-19 were discussed with the patient and how to seek care for testing (follow up with PCP or arrange E-visit).  The importance of social distancing was discussed today.  Time:   Today, I have spent 25 minutes with the patient with telehealth technology discussing the  above problems.     Medication Adjustments/Labs and Tests Ordered: Current medicines are reviewed at length with the patient today.  Concerns regarding medicines are outlined above.   Tests Ordered: No orders of the defined types were placed in this encounter.   Medication Changes: No orders of the defined types were placed in this encounter.   Disposition:  Follow up I plan to see her in 4 to 6 months for reevaluation.  Over the next month she will also undergo repeat comprehensive metabolic panel and lipid studies following the addition of olmesartan and to reassess Zetia/atorvastatin benefit  Signed, Nicki Guadalajara, MD  07/28/2018 10:32 AM    [facility]

## 2018-07-28 NOTE — Patient Instructions (Addendum)
Medication Instructions:  The current medical regimen is effective;  continue present plan and medications.  If you need a refill on your cardiac medications before your next appointment, please call your pharmacy.   Lab work: Have lab work done when safe to do so.   If you have labs (blood work) drawn today and your tests are completely normal, you will receive your results only by: Marland Kitchen MyChart Message (if you have MyChart) OR . A paper copy in the mail If you have any lab test that is abnormal or we need to change your treatment, we will call you to review the results.  Follow-Up: At [facility], you and your health needs are our priority.  As part of our continuing mission to provide you with exceptional heart care, we have created designated Provider Care Teams.  These Care Teams include your primary Cardiologist (physician) and Advanced Practice Providers (APPs -  Physician Assistants and Nurse Practitioners) who all work together to provide you with the care you need, when you need it. You will need a follow up appointment in 4-6 months.   You may see Dr.Kelly or one of the following Advanced Practice Providers on your designated Care Team: Azalee Course, [place] . Micah Flesher, PA-C

## 2018-12-09 LAB — COMPREHENSIVE METABOLIC PANEL
ALT: 26 IU/L (ref 0–32)
AST: 21 IU/L (ref 0–40)
Albumin/Globulin Ratio: 2.7 — ABNORMAL HIGH (ref 1.2–2.2)
Albumin: 4.9 g/dL — ABNORMAL HIGH (ref 3.7–4.7)
Alkaline Phosphatase: 63 IU/L (ref 39–117)
BUN/Creatinine Ratio: 26 (ref 12–28)
BUN: 16 mg/dL (ref 8–27)
Bilirubin Total: 0.3 mg/dL (ref 0.0–1.2)
CO2: 27 mmol/L (ref 20–29)
Calcium: 9.6 mg/dL (ref 8.7–10.3)
Chloride: 99 mmol/L (ref 96–106)
Creatinine, Ser: 0.61 mg/dL (ref 0.57–1.00)
GFR calc Af Amer: 102 mL/min/{1.73_m2} (ref >59)
GFR calc non Af Amer: 88 mL/min/{1.73_m2} (ref >59)
Globulin, Total: 1.8 g/dL (ref 1.5–4.5)
Glucose: 93 mg/dL (ref 65–99)
Potassium: 4.7 mmol/L (ref 3.5–5.2)
Sodium: 138 mmol/L (ref 134–144)
Total Protein: 6.7 g/dL (ref 6.0–8.5)

## 2018-12-09 LAB — LIPID PANEL
Chol/HDL Ratio: 2 ratio (ref 0.0–4.4)
Cholesterol, Total: 159 mg/dL (ref 100–199)
HDL: 81 mg/dL (ref >39)
LDL Chol Calc (NIH): 68 mg/dL (ref 0–99)
Triglycerides: 46 mg/dL (ref 0–149)
VLDL Cholesterol Cal: 10 mg/dL (ref 5–40)

## 2018-12-14 ENCOUNTER — Ambulatory Visit (INDEPENDENT_AMBULATORY_CARE_PROVIDER_SITE_OTHER): Payer: Medicare Other | Admitting: Cardiovascular Disease

## 2018-12-14 ENCOUNTER — Other Ambulatory Visit: Payer: Self-pay

## 2018-12-14 ENCOUNTER — Encounter: Payer: Self-pay | Admitting: Cardiovascular Disease

## 2018-12-14 VITALS — BP 151/63 | HR 63 | Temp 97.2°F | Ht 64.0 in | Wt 134.0 lb

## 2018-12-14 DIAGNOSIS — F329 Major depressive disorder, single episode, unspecified: Secondary | ICD-10-CM

## 2018-12-14 DIAGNOSIS — E785 Hyperlipidemia, unspecified: Secondary | ICD-10-CM | POA: Diagnosis not present

## 2018-12-14 DIAGNOSIS — J45909 Unspecified asthma, uncomplicated: Secondary | ICD-10-CM | POA: Diagnosis not present

## 2018-12-14 DIAGNOSIS — I2510 Atherosclerotic heart disease of native coronary artery without angina pectoris: Secondary | ICD-10-CM

## 2018-12-14 DIAGNOSIS — I10 Essential (primary) hypertension: Secondary | ICD-10-CM | POA: Diagnosis not present

## 2018-12-14 DIAGNOSIS — F419 Anxiety disorder, unspecified: Secondary | ICD-10-CM

## 2018-12-14 DIAGNOSIS — F32A Depression, unspecified: Secondary | ICD-10-CM

## 2018-12-14 MED ORDER — OLMESARTAN MEDOXOMIL 20 MG PO TABS: 30.0000 mg | ORAL_TABLET | Freq: Every day | ORAL | 3 refills | Status: DC

## 2018-12-14 NOTE — Progress Notes (Signed)
Patient ID: NEMESIS RAINWATER, female   DOB: Jul 07, 1940, 78 y.o.   MRN: [id number]     HPI: AKIMA SLAUGH is a 78 y.o. female who presents for a 5 month cardiology evaluation.  Ms. Corron has CAD and in 2001 underwent cardiac catheterization while in [place] which showed a 60% ostial narrowing of her LAD. Repeat catheterization by me in 2009 did not show any significant interval change. Unfortunately, she has continued her ongoing tobacco habit but is now smoking significantly reduced at less than one quarter pack per day.  She has a history of hypertension as well as hyperlipidemia.  A nuclear perfusion study in August 2013 which was unchanged from 2 years previously and continued to show normal perfusion.   In December 2015 prior to undergoing right hip surgery a 2 year follow-up nuclear perfusion study on 03/13/2014 was performed preoperatively.  This continued to show normal perfusion without scar or ischemia.  She tolerated her hip surgery well.  She had smoked for 50 years before finally quitting smoking when I had seen her last.  When I last saw he 2 months ago, she had complained of experiencing increasing shortness of breath of 6 weeks duration that was not consistently exertionally precipitated.   She admitted to fatigue.  She is unaware of palpitations.  She has hyperlipidemia and has been taking atorvastatin 80 mg and Zetia 10 mg in attempt to continue to induce plaque regression.    An echo Doppler study on 02/18/2015 showed an EF of 55-60%, moderate thickening and calcification of a trileaflet aortic valve, consistent with sclerosis but without stenosis and mitral annular calcification.  Laboratory revealed excellent lipid studies with a total cholesterol 149, triglycerides 55, HDL 85 and her LDL was now 53.  Thyroid function studies were normal.  Her renal function was excellent.  LFTs were normal.  Vitamin D was normal at 41 and her vitamin B-12 level was normal at 448.    She  was seen by [name] in October 2017 with complaints of dyspnea on exertion.  She has a history of asthma as well as numerous allergies to foods and some drugs.  She tells me she had the flu in early January 2018.  When I last saw her in January 2018.  She had stop smoking.  She did have complaints of dyspnea.  I recommended a follow-up echo Doppler study which was done on 04/29/2016.  This showed normal LV function and normal diastolic parameters.  The aortic valve was mildly calcified and was without stenosis.  There was mild aortic insufficiency.  Her mitral valve annulus was calcified and leaflets were mildly thickened.  Her left atrium was mildly dilated.   When I saw her in November 2018 she had been taking caring for her sister who had undergone aortic stent grafting.  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  When I saw her, I recommended complete smoking cessation and suggested a pulmonary evaluation with Dr. Curt Jews.   She underwent a coronary CT, which showed a calcium score of 81 which was 59 percentile for age and sex matched control.  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  There is right dominance.  There was diffuse calcified plaque in the RCA and proximal LAD with narrowing of 25-50%.  7.  She underwent FFR analysis with both lesions being in the normal FFR range of greater  than 0.80.  She has significantly reduced her tobacco and now smokes very intermittently.   I saw her in May 04, 2017 currently at that time it appeared that she was on both low-dose amlodipine in addition to diltiazem.  I discontinued amlodipine and further titrated diltiazem.  Over the past year she has done fairly well.  She had seen Mammie Russian, PA C in August 2019.  She had experienced occasional twinges of chest pain.  He had undergone cataract surgery.  I saw her in January 2020 in the office  and last saw her in April 2020 in a telemedicine visit.  At that time, she was tolerating olmesartan 20 mg with her diltiazem for blood pressure control.  There was concerns of GI issues and she is in need for a colonoscopy.  She had been evaluated by Dr. Benson Norway and this had been deferred with the COVID-19 pandemic.  Recently, her sister died with cardiogenic shock and sepsis.  She has seen Dr. Verlin Fester for allergies.  She denies chest pain.  She states her blood pressure at home has been running in the 120s to 140s.  She presents for reevaluation.  Past Medical History:  Diagnosis Date  . Anxiety   . Arthritis   . Asthma   . CAD (coronary artery disease)    single vessel  . GERD (gastroesophageal reflux disease)   . Headache   . History of stress test 10/2011   Resting images reveal a normal pattern of perfusion in all regions. The post stress myocardial perfusion images show a normal pattern of perfusion in all region. The post stress left ventricle is normal size. the rest left ventricle is normal size, there is no scintigraphic evidence of inductible myocardial ischemia.  Marland Kitchen Hx of echocardiogram 09/2009   EF >55% Normal Echo  . Hyperlipidemia   . Hypertension   . Migraines   . Pneumonia    hx of  . Pulmonary emboli ([place]) 5/12  . Shortness of breath dyspnea    exertion or eating something shes allergic too  . Tobacco abuse     Past Surgical History:  Procedure Laterality Date  . ADENOIDECTOMY    . BACK SURGERY  2012  . CARDIAC CATHETERIZATION  10/13/2007   60% narrowing twin LAD system  . FOOT SURGERY  2002,2013   right  . KNEE SURGERY  01/17/2007   right  . SACROILIAC JOINT FUSION Right 03/28/2014   Procedure: SACROILIAC JOINT FUSION;  Surgeon: Sinclair Ship, MD;  Location: [facility];  Service: Orthopedics;  Laterality: Right;  Right sided sacroliac joint fusion  . TONSILLECTOMY      Allergies  Allergen Reactions  . Cefzil [Cefprozil] Shortness Of Breath  . Keflex  [Cephalexin] Shortness Of Breath and Rash  . Lactose Intolerance (Gi) Shortness Of Breath    ALL DAIRY PRODUCTS  . Levaquin [Levofloxacin] Shortness Of Breath and Rash  . Naproxen Shortness Of Breath    Shortness of breath Shortness of breath  . Oxycodone-Acetaminophen Nausea And Vomiting and Shortness Of Breath  . Peanuts [Peanut Oil] Shortness Of Breath  . Penicillins Shortness Of Breath  . Percocet [Oxycodone-Acetaminophen] Shortness Of Breath  . Red Dye Shortness Of Breath  . Sulfa Antibiotics Shortness Of Breath  . Sulfacetamide Sodium Shortness Of Breath  . Tetracycline Hcl Shortness Of Breath  . Tetracyclines & Related Shortness Of Breath  . Methocarbamol Nausea And Vomiting  . Prednisone Other (See Comments)    Rash  . Tomato Other (See  Comments)    SOB  . Amoxicillin-Pot Clavulanate Rash  . Ibuprofen Rash  . Moxifloxacin Rash  . Penicillin G Rash  . Sulfamethoxazole-Trimethoprim Rash  . Tetracycline Rash  . Tramadol Rash    Current Outpatient Medications  Medication Sig Dispense Refill  . ALPRAZolam (XANAX) 1 MG tablet TAKE 1 TABLET UP TO THREE TIMES PER DAY FOR ANXIETY    . atorvastatin (LIPITOR) 80 MG tablet Take 1 tablet (80 mg total) by mouth daily. 90 tablet 3  . budesonide (PULMICORT) 0.5 MG/2ML nebulizer solution USE AS DIRECTED FOR NASAL RINSE 120 mL 0  . butalbital-acetaminophen-caffeine (FIORICET WITH CODEINE) 50-325-40-30 MG per capsule Take 1 capsule by mouth daily as needed for headache.   5  . Cholecalciferol (VITAMIN D3) 2000 UNITS TABS Take 2,000 Units by mouth daily.     Marland Kitchen diltiazem (CARDIZEM CD) 300 MG 24 hr capsule Take 1 capsule (300 mg total) by mouth at bedtime. 90 capsule 3  . escitalopram (LEXAPRO) 20 MG tablet Take 1 tablet (20 mg total) by mouth daily. 90 tablet 0  . ezetimibe (ZETIA) 10 MG tablet Take 1 tablet (10 mg total) by mouth daily. 90 tablet 3  . Fluticasone-Salmeterol (ADVAIR DISKUS) 250-50 MCG/DOSE AEPB Inhale 1 puff into the lungs  2 (two) times daily.    . Multiple Vitamin (MULTI-VITAMIN DAILY PO) Take 1 tablet by mouth daily.    . nitroGLYCERIN (NITROSTAT) 0.4 MG SL tablet Place 1 tablet (0.4 mg total) under the tongue every 5 (five) minutes as needed for chest pain. Needs appointment for future refills 25 tablet 0  . olmesartan (BENICAR) 20 MG tablet Take 1.5 tablets (30 mg total) by mouth daily. Take 30-65m as needed for BP 90 tablet 3  . ondansetron (ZOFRAN) 4 MG tablet Take 4 mg by mouth every 8 (eight) hours as needed for nausea.    . pantoprazole (PROTONIX) 40 MG tablet Take 1 tablet by mouth 2 (two) times daily.    .Marland KitchenPhenylephrine HCl ([facility] SINUS PE DECONGESTANT PO) Take by mouth.    . predniSONE (DELTASONE) 10 MG tablet Take 3 tablets PO for 3 days, then take 2 tablets PO for 3 days, then take 1 tablet PO for 3 days.    . terbutaline (BRETHINE) 5 MG tablet Take 5 mg by mouth every 6 (six) hours as needed (asthma).     . VENTOLIN HFA 108 (90 Base) MCG/ACT inhaler INHALE 2 PUFFS INTO THE LUNGS EVERY 6 (SIX) HOURS AS NEEDED FOR WHEEZING.  11   No current facility-administered medications for this visit.     Social History   Socioeconomic History  . Marital status: Widowed    Spouse name: Not on file  . Number of children: Not on file  . Years of education: Not on file  . Highest education level: Not on file  Occupational History  . Not on file  Social Needs  . Financial resource strain: Not on file  . Food insecurity    Worry: Not on file    Inability: Not on file  . Transportation needs    Medical: Not on file    Non-medical: Not on file  Tobacco Use  . Smoking status: Current Some Day Smoker    Packs/day: 0.10    Types: Cigarettes    Start date: 01/30/2014  . Smokeless tobacco: Former [employer] developer   Quit date: 01/29/2014  Substance and Sexual Activity  . Alcohol use: Yes    Alcohol/week: 4.0 standard drinks  Types: 2 Glasses of wine, 2 Cans of beer per week  . Drug use: No  . Sexual activity: Not  on file  Lifestyle  . Physical activity    Days per week: Not on file    Minutes per session: Not on file  . Stress: Not on file  Relationships  . Social Herbalist on phone: Not on file    Gets together: Not on file    Attends religious service: Not on file    Active member of club or organization: Not on file    Attends meetings of clubs or organizations: Not on file    Relationship status: Not on file  . Intimate partner violence    Fear of current or ex partner: Not on file    Emotionally abused: Not on file    Physically abused: Not on file    Forced sexual activity: Not on file  Other Topics Concern  . Not on file  Social History Narrative  . Not on file    Family History  Problem Relation Age of Onset  . Heart attack Mother   . Diabetes Mother   . Asthma Mother   . Heart attack Father   . Diabetes Sister   . Hyperlipidemia Sister   . Allergic rhinitis Neg Hx   . Angioedema Neg Hx   . Eczema Neg Hx   . Immunodeficiency Neg Hx   . Urticaria Neg Hx    Socially, she is widowed. She has 2 children 5 grandchildren. She does not exercise. She quit smoking 2 years ago. resumed on 08/21/22 after her dog died.  She is committed to completely discontinue tobacco.   ROS General: Negative; No fevers, chills, or night sweats;  HEENT: Negative; No changes in vision or hearing, sinus congestion, difficulty swallowing Pulmonary: Positive for asthma Cardiovascular: Negative; No chest pain, presyncope, syncope, palpitations GI: positive for GERD; No nausea, vomiting, diarrhea, or abdominal pain GU: Negative; No dysuria, hematuria, or difficulty voiding Musculoskeletal: She tolerated her hip surgery well. Hematologic/Oncology: Negative; no easy bruising, bleeding Endocrine: Negative; no heat/cold intolerance; no diabetes Neuro: Negative; no changes in balance, headaches Skin: Negative; No rashes or skin lesions Psychiatric: Negative; No behavioral problems, depression  Sleep: Negative; No snoring, daytime sleepiness, hypersomnolence, bruxism, restless legs, hypnogognic hallucinations, no cataplexy Other comprehensive 14 point system review is negative.  Nutritionally she cannot have dairy products due to significant milk allergy.  PE BP (!) 151/63   Pulse 63   Temp (!) 97.2 F (36.2 C)   Ht 5' 4" (1.626 m)   Wt 134 lb (60.8 kg)   LMP  (LMP Unknown)   SpO2 98%   BMI 23.00 kg/m    Repeat blood pressure by me was elevated at 150/64  Wt Readings from Last 3 Encounters:  12/14/18 134 lb (60.8 kg)  07/28/18 135 lb (61.2 kg)  04/26/18 135 lb 12.8 oz (61.6 kg)   General: Alert, oriented, no distress.  Skin: normal turgor, no rashes, warm and dry HEENT: Normocephalic, atraumatic. Pupils equal round and reactive to light; sclera anicteric; extraocular muscles intact;   Nose without nasal septal hypertrophy Mouth/Parynx benign; Mallinpatti scale 3 Neck: No JVD, no carotid bruits; normal carotid upstroke Lungs: clear to ausculatation and percussion; no wheezing or rales Chest wall: without tenderness to palpitation Heart: PMI not displaced, RRR, s1 s2 normal, 1/6 systolic murmur, no diastolic murmur, no rubs, gallops, thrills, or heaves Abdomen: soft, nontender; no hepatosplenomehaly, BS+; abdominal  aorta nontender and not dilated by palpation. Back: no CVA tenderness Pulses 2+ Musculoskeletal: full range of motion, normal strength, no joint deformities Extremities: no clubbing cyanosis or edema, Homan's sign negative  Neurologic: grossly nonfocal; Cranial nerves grossly wnl Psychologic: Normal mood and affect   ECG (independently read by me): Sinus bradycardia at 58 bpm.  Nonspecific ST changes.  Poor R wave progression V1 through V3  January 2020 ECG (independently read by me): Normal sinus rhythm at 61 bpm.  No ectopy.  Specific ST change in lead III.  February 2019 ECG (independently read by me): Normal sinus rhythm at 72 bpm.  Nonspecific ST  changes inferiorly.  QTc interval 451 ms.  June 2018 ECG (independently read by me): Sinus bradycardia 57 bpm.  No significant ST-T changes.  Normal intervals.  January 2018 ECG (independently read by me): Normal sinus rhythm at 69 bpm.  No ectopy.  Normal intervals.  November 2016 ECG (independently read by me): Normal sinus rhythm at 60 bpm.  No significant ST-T changes.  Normal intervals.  November 2015 ECG (independently read by me): Normal sinus rhythm at 72 bpm.  Normal intervals.  No significant ST-T changes.  Prior October 2014 ECG: Sinus rhythm with occasional isolated PVC. Heart rate 68 beats per minute. QTc interval 440 ms.  LAB: BMP Latest Ref Rng & Units 12/09/2018 11/03/2017 02/08/2017  Glucose 65 - 99 mg/dL 93 95 99  BUN 8 - 27 mg/dL _0 Creatinine 0.57 - 1.00 mg/dL 0.61 0.54(L) 0.54(L)  BUN/Creat Ratio 12 - _1 Sodium 134 - 144 mmol/L 138 142 144  Potassium 3.5 - 5.2 mmol/L 4.7 4.9 4.7  Chloride 96 - 106 mmol/L 99 103 104  CO2 20 - 29 mmol/L _2 Calcium 8.7 - 10.3 mg/dL 9.6 9.3 9.6   Hepatic Function Latest Ref Rng & Units 12/09/2018 11/03/2017 02/08/2017  Total Protein 6.0 - 8.5 g/dL 6.7 6.1 6.6  Albumin 3.7 - 4.7 g/dL 4.9(H) 4.5 4.7  AST 0 - 40 IU/L _3 ALT 0 - 32 IU/L _4 Alk Phosphatase 39 - 117 IU/L 63 67 78  Total Bilirubin 0.0 - 1.2 mg/dL 0.3 0.2 0.3   CBC Latest Ref Rng & Units 11/03/2017 02/08/2017 04/27/2016  WBC 3.4 - 10.8 x10E3/uL 4.5 6.7 6.6  Hemoglobin 11.1 - 15.9 g/dL 12.0 13.4 11.7  Hematocrit 34.0 - 46.6 % 36.5 39.5 36.2  Platelets 150 - 450 x10E3/uL 197 185 196   Lab Results  Component Value Date   MCV 92 11/03/2017   MCV 92 02/08/2017   MCV 93.5 04/27/2016   Lab Results  Component Value Date   TSH 2.150 11/03/2017   No results found for: HGBA1C   Lipid Panel     Component Value Date/Time   CHOL 159 12/09/2018 0931   TRIG 46 12/09/2018 0931   HDL 81 12/09/2018 0931   CHOLHDL 2.0 12/09/2018 0931    CHOLHDL 2.0 04/27/2016 0923   VLDL 10 04/27/2016 0923   LDLCALC 69 11/03/2017 0835    RADIOLOGY: No results found.  IMPRESSION:  1. Essential hypertension   2. Hyperlipidemia LDL goal <70   3. Coronary artery disease involving native coronary artery of native heart without angina pectoris   4. Mild asthma without complication, unspecified whether persistent   5. Anxiety and depression     ASSESSMENT AND PLAN: Ms. Kjos is a 78 year-old white female who had smoked for over  50 years and quit smoking 2 years ago.  Unfortunately, she was under significant stress with the  death of her dog suddenly and resumed smoking in May 2018 continue to smoke intermittently but states she quit smoking in March 2020.  She has CAD with a 60% ostial LAD stenosis dating back to 2001 and which again was noted on her repeat catheterization by me in 2009.  We have been aggressively treating her lipids in attempt to induce plaque regression and she is tolerated Lipitor 80 mg in addition to Zetia 10 mg.  Most recent laboratory on December 09, 2018 showed a total cholesterol 159, triglycerides 46, HDL 81, and LDL cholesterol at 68.  A previous coronary CT angiography in December 2018 did not demonstrate significant progression of disease and showed 25-50% stenoses in the LAD and RCA; FFR was negative.  I had seen her earlier this year, I had instituted olmesartan due to continued blood pressure elevation.  Her blood pressure today continues to be elevated at 150/64.  She admits that she has been under some increased stress particularly with her recent sister's death.  She also has noticed some increased shortness of breath in August.  She denies any chest pain.  I am recommending further titration of olmesartan to 30 mg and she continues to take diltiazem 300 mg daily.  She will monitor her blood pressure and if her blood pressure is still staying above 1 35-1 40 chronically further titration of 40 mg will be done.  Her  asthma is controlled and she continues to take Pulmicort court in addition to Advair and has a prescription for as needed terbutaline.  Her GERD is controlled with Protonix.  She will be undergoing a future endoscopy with Dr. Benson Norway.  She continues to take alprazolam for anxiety.  I will see her in 4 months for reevaluation  Time spent: 25 minutes  Troy Sine, MD, [facility]  12/16/2018 6:18 PM

## 2018-12-14 NOTE — Patient Instructions (Signed)
Medication Instructions:  TAKE OLMESARTAN 30MG  DAILY MAY INCREASE TO 40MG  DAILY If you need a refill on your cardiac medications before your next appointment, please call your pharmacy.  Special Instructions: TAKE BLOOD PRESSURE DAILY IF BP >130-140 (TOP NUMBER) MAY INCREASE TO 40MG  DAILY  Follow-Up: You will need a follow up appointment in 4 months.  Please call our office 2 months in advance to schedule this appointment.  You may see Shelva Majestic, MD or one of the following Advanced Practice Providers on your designated Care Team: [place], [place] . Fabian Sharp, PA-C     At [facility], you and your health needs are our priority.  As part of our continuing mission to provide you with exceptional heart care, we have created designated Provider Care Teams.  These Care Teams include your primary Cardiologist (physician) and Advanced Practice Providers (APPs -  Physician Assistants and Nurse Practitioners) who all work together to provide you with the care you need, when you need it.  Thank you for choosing [facility] at [facility]!!

## 2018-12-16 ENCOUNTER — Encounter: Payer: Self-pay | Admitting: Cardiovascular Disease

## 2019-04-05 ENCOUNTER — Other Ambulatory Visit: Payer: Self-pay | Admitting: Cardiovascular Disease

## 2019-04-05 ENCOUNTER — Telehealth: Payer: Self-pay | Admitting: Cardiovascular Disease

## 2019-04-05 MED ORDER — DILTIAZEM HCL ER COATED BEADS 300 MG PO CP24: 300.0000 mg | ORAL_CAPSULE | Freq: Every day | ORAL | 3 refills | Status: DC

## 2019-04-05 NOTE — Telephone Encounter (Signed)
*  STAT* If patient is at the pharmacy, call can be transferred to refill team.   1. Which medications need to be refilled? (please list name of each medication and dose if known) diltiazem (CARDIZEM CD) 300 MG 24 hr capsule  2. Which pharmacy/location (including street and city if local pharmacy) is medication to be sent to? [id number] - [place], [place] - [address]  3. Do they need a 30 day or 90 day supply? 30 day  Patient is out of medication. She states her refill was lost in the mail.

## 2019-04-14 ENCOUNTER — Other Ambulatory Visit: Payer: Self-pay

## 2019-04-14 ENCOUNTER — Ambulatory Visit (INDEPENDENT_AMBULATORY_CARE_PROVIDER_SITE_OTHER): Payer: Medicare Other | Admitting: Cardiovascular Disease

## 2019-04-14 ENCOUNTER — Encounter: Payer: Self-pay | Admitting: Cardiovascular Disease

## 2019-04-14 VITALS — BP 140/70 | HR 70 | Ht 64.0 in | Wt 133.0 lb

## 2019-04-14 DIAGNOSIS — Z79899 Other long term (current) drug therapy: Secondary | ICD-10-CM

## 2019-04-14 DIAGNOSIS — I25118 Atherosclerotic heart disease of native coronary artery with other forms of angina pectoris: Secondary | ICD-10-CM

## 2019-04-14 DIAGNOSIS — I10 Essential (primary) hypertension: Secondary | ICD-10-CM

## 2019-04-14 DIAGNOSIS — J45909 Unspecified asthma, uncomplicated: Secondary | ICD-10-CM

## 2019-04-14 DIAGNOSIS — R0600 Dyspnea, unspecified: Secondary | ICD-10-CM | POA: Diagnosis not present

## 2019-04-14 DIAGNOSIS — E785 Hyperlipidemia, unspecified: Secondary | ICD-10-CM

## 2019-04-14 DIAGNOSIS — R0609 Other forms of dyspnea: Secondary | ICD-10-CM

## 2019-04-14 MED ORDER — METOPROLOL TARTRATE 25 MG PO TABS: 12.5000 mg | ORAL_TABLET | Freq: Two times a day (BID) | ORAL | 3 refills | Status: DC

## 2019-04-14 MED ORDER — ISOSORBIDE MONONITRATE ER 30 MG PO TB24: 30.0000 mg | ORAL_TABLET | Freq: Every day | ORAL | 3 refills | Status: DC

## 2019-04-14 NOTE — Progress Notes (Signed)
Patient ID: Judith Stevens, female   DOB: 19-Dec-1940, 79 y.o.   MRN: [id number]     HPI: Judith Stevens is a 79 y.o. female who presents for a 4 month cardiology evaluation.  Judith Stevens has CAD and in 2001 underwent cardiac catheterization while in [place] which showed a 60% ostial narrowing of her LAD. Repeat catheterization by me in 2009 did not show any significant interval change. Unfortunately, she has continued her ongoing tobacco habit but is now smoking significantly reduced at less than one quarter pack per day.  She has a history of hypertension as well as hyperlipidemia.  A nuclear perfusion study in August 2013 which was unchanged from 2 years previously and continued to show normal perfusion.   In December 2015 prior to undergoing right hip surgery a 2 year follow-up nuclear perfusion study on 03/13/2014 was performed preoperatively.  This continued to show normal perfusion without scar or ischemia.  She tolerated her hip surgery well.  She had smoked for 50 years before finally quitting smoking when I had seen her last.  When I last saw he 2 months ago, she had complained of experiencing increasing shortness of breath of 6 weeks duration that was not consistently exertionally precipitated.   She admitted to fatigue.  She is unaware of palpitations.  She has hyperlipidemia and has been taking atorvastatin 80 mg and Zetia 10 mg in attempt to continue to induce plaque regression.    An echo Doppler study on 02/18/2015 showed an EF of 55-60%, moderate thickening and calcification of a trileaflet aortic valve, consistent with sclerosis but without stenosis and mitral annular calcification.  Laboratory revealed excellent lipid studies with a total cholesterol 149, triglycerides 55, HDL 85 and her LDL was now 53.  Thyroid function studies were normal.  Her renal function was excellent.  LFTs were normal.  Vitamin D was normal at 41 and her vitamin B-12 level was normal at 448.    She  was seen by Judith Stevens in October 2017 with complaints of dyspnea on exertion.  She has a history of asthma as well as numerous allergies to foods and some drugs.  She tells me she had the flu in early January 2018.  When I last saw her in January 2018.  She had stop smoking.  She did have complaints of dyspnea.  I recommended a follow-up echo Doppler study which was done on 04/29/2016.  This showed normal LV function and normal diastolic parameters.  The aortic valve was mildly calcified and was without stenosis.  There was mild aortic insufficiency.  Her mitral valve annulus was calcified and leaflets were mildly thickened.  Her left atrium was mildly dilated.   When I saw her in November 2018 she had been taking caring for her sister who had undergone aortic stent grafting.  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  When I saw her, I recommended complete smoking cessation and suggested a pulmonary evaluation with Dr. Curt Stevens.   She underwent a coronary CT, which showed a calcium score of 81 which was 35 percentile for age and sex matched control.  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  There is right dominance.  There was diffuse calcified plaque in the RCA and proximal LAD with narrowing of 25-50%.  7.  She underwent FFR analysis with both lesions being in the normal FFR range of greater  than 0.80.  She has significantly reduced her tobacco and now smokes very intermittently.   I saw her in May 04, 2017 currently at that time it appeared that she was on both low-dose amlodipine in addition to diltiazem.  I discontinued amlodipine and further titrated diltiazem.  Over the past year she has done fairly well.  She had seen Judith Russian, PA C in August 2019.  She had experienced occasional twinges of chest pain.  He had undergone cataract surgery.  I saw her in January 2020 in the office  and  in April 2020 in a telemedicine visit.  At that time, she was tolerating olmesartan 20 mg with her diltiazem for blood pressure control.  There was concerns of GI issues and she is in need for a colonoscopy.  She had been evaluated by Dr. Benson Stevens and this had been deferred with the COVID-19 pandemic.    I last saw her in September 2020.  At that time she denied any chest pain PND orthopnea.  Her blood pressure had been stable typically running in the 120s to 140s at home.  Recently, her sister died with cardiogenic shock and sepsis.  She has seen Dr. Verlin Stevens for allergies.  During that evaluation, her blood pressure was elevated at 150/64.  She had been under increased stress particularly with her sister's death.  I recommended further titration of olmesartan to 30 mg she was continue to take diltiazem CD 300 mg daily.  I recommended that if her blood pressure consistently stayed above 336 systolically further titration to olmesartan 40 mg would be necessary.  Over the last several months she had felt fairly well but for the past 6 to 8 weeks she has noticed more shortness of breath than she had experienced previously.  Approximately 3 weeks ago, she had an episode of chest tightness which resolved.  She denies any significant chest pain since that time and denies any nocturnal symptoms.  She has experienced shortness of breath with activity and has taken a terbutaline tablet which she had had and noted improvement.  She denies any PND orthopnea.  She denies any nocturnal chest tightness.  She presents to the office today for evaluation.  Past Medical History:  Diagnosis Date  . Anxiety   . Arthritis   . Asthma   . CAD (coronary artery disease)    single vessel  . GERD (gastroesophageal reflux disease)   . Headache   . History of stress test 10/2011   Resting images reveal a normal pattern of perfusion in all regions. The post stress myocardial perfusion images show a normal pattern of perfusion in  all region. The post stress left ventricle is normal size. the rest left ventricle is normal size, there is no scintigraphic evidence of inductible myocardial ischemia.  Marland Kitchen Hx of echocardiogram 09/2009   EF >55% Normal Echo  . Hyperlipidemia   . Hypertension   . Migraines   . Pneumonia    hx of  . Pulmonary emboli ([place]) 5/12  . Shortness of breath dyspnea    exertion or eating something shes allergic too  . Tobacco abuse     Past Surgical History:  Procedure Laterality Date  . ADENOIDECTOMY    . BACK SURGERY  2012  . CARDIAC CATHETERIZATION  10/13/2007   60% narrowing twin LAD system  . FOOT SURGERY  2002,2013   right  . KNEE SURGERY  01/17/2007   right  . SACROILIAC JOINT FUSION Right 03/28/2014   Procedure: SACROILIAC  JOINT FUSION;  Surgeon: Sinclair Ship, MD;  Location: [facility];  Service: Orthopedics;  Laterality: Right;  Right sided sacroliac joint fusion  . TONSILLECTOMY      Allergies  Allergen Reactions  . Cefzil [Cefprozil] Shortness Of Breath  . Keflex [Cephalexin] Shortness Of Breath and Rash  . Lactose Intolerance (Gi) Shortness Of Breath    ALL DAIRY PRODUCTS  . Levaquin [Levofloxacin] Shortness Of Breath and Rash  . Naproxen Shortness Of Breath    Shortness of breath Shortness of breath  . Oxycodone-Acetaminophen Nausea And Vomiting and Shortness Of Breath  . Peanuts [Peanut Oil] Shortness Of Breath  . Penicillins Shortness Of Breath  . Percocet [Oxycodone-Acetaminophen] Shortness Of Breath  . Red Dye Shortness Of Breath  . Sulfa Antibiotics Shortness Of Breath  . Sulfacetamide Sodium Shortness Of Breath  . Tetracycline Hcl Shortness Of Breath  . Tetracyclines & Related Shortness Of Breath  . Methocarbamol Nausea And Vomiting  . Prednisone Other (See Comments)    Rash  . Tomato Other (See Comments)    SOB  . Amoxicillin-Pot Clavulanate Rash  . Ibuprofen Rash  . Moxifloxacin Rash  . Penicillin G Rash  . Sulfamethoxazole-Trimethoprim Rash  .  Tetracycline Rash  . Tramadol Rash    Current Outpatient Medications  Medication Sig Dispense Refill  . ALPRAZolam (XANAX) 1 MG tablet TAKE 1 TABLET UP TO THREE TIMES PER DAY FOR ANXIETY    . aspirin EC 81 MG tablet Take 81 mg by mouth daily.    Marland Kitchen atorvastatin (LIPITOR) 80 MG tablet Take 1 tablet (80 mg total) by mouth daily. 90 tablet 3  . budesonide (PULMICORT) 0.5 MG/2ML nebulizer solution USE AS DIRECTED FOR NASAL RINSE 120 mL 0  . butalbital-acetaminophen-caffeine (FIORICET) 50-325-40 MG tablet Take 1 tablet by mouth every 6 (six) hours as needed.    . Cholecalciferol (VITAMIN D3) 2000 UNITS TABS Take 2,000 Units by mouth daily.     Marland Kitchen diltiazem (CARDIZEM CD) 300 MG 24 hr capsule Take 1 capsule (300 mg total) by mouth at bedtime. 90 capsule 3  . escitalopram (LEXAPRO) 20 MG tablet Take 1 tablet (20 mg total) by mouth daily. 90 tablet 0  . ezetimibe (ZETIA) 10 MG tablet Take 1 tablet (10 mg total) by mouth daily. 90 tablet 3  . Fluticasone-Salmeterol (ADVAIR DISKUS) 250-50 MCG/DOSE AEPB Inhale 1 puff into the lungs 2 (two) times daily.    . Multiple Vitamin (MULTI-VITAMIN DAILY PO) Take 1 tablet by mouth daily.    . nitroGLYCERIN (NITROSTAT) 0.4 MG SL tablet Place 1 tablet (0.4 mg total) under the tongue every 5 (five) minutes as needed for chest pain. Needs appointment for future refills 25 tablet 0  . olmesartan (BENICAR) 20 MG tablet Take 1.5 tablets (30 mg total) by mouth daily. Take 30-57m as needed for BP 90 tablet 3  . ondansetron (ZOFRAN) 4 MG tablet Take 4 mg by mouth every 8 (eight) hours as needed for nausea.    . pantoprazole (PROTONIX) 40 MG tablet Take 1 tablet by mouth 2 (two) times daily.    .Marland KitchenPhenylephrine HCl ([employer] SINUS PE DECONGESTANT PO) Take by mouth.    . predniSONE (DELTASONE) 10 MG tablet Take 3 tablets PO for 3 days, then take 2 tablets PO for 3 days, then take 1 tablet PO for 3 days.    . terbutaline (BRETHINE) 5 MG tablet Take 5 mg by mouth every 6 (six) hours as  needed (asthma).     . VENTOLIN  HFA 108 (90 Base) MCG/ACT inhaler INHALE 2 PUFFS INTO THE LUNGS EVERY 6 (SIX) HOURS AS NEEDED FOR WHEEZING.  11  . isosorbide mononitrate (IMDUR) 30 MG 24 hr tablet Take 1 tablet (30 mg total) by mouth daily. 30 tablet 3  . metoprolol tartrate (LOPRESSOR) 25 MG tablet Take 0.5 tablets (12.5 mg total) by mouth 2 (two) times daily. 90 tablet 3   No current facility-administered medications for this visit.    Social History   Socioeconomic History  . Marital status: Widowed    Spouse name: Not on file  . Number of children: Not on file  . Years of education: Not on file  . Highest education level: Not on file  Occupational History  . Not on file  Tobacco Use  . Smoking status: Current Some Day Smoker    Packs/day: 0.10    Types: Cigarettes    Start date: 01/30/2014  . Smokeless tobacco: Former Systems developer    Quit date: 01/29/2014  Substance and Sexual Activity  . Alcohol use: Yes    Alcohol/week: 4.0 standard drinks    Types: 2 Glasses of wine, 2 Cans of beer per week  . Drug use: No  . Sexual activity: Not on file  Other Topics Concern  . Not on file  Social History Narrative  . Not on file   Social Determinants of Health   Financial Resource Strain:   . Difficulty of Paying Living Expenses: Not on file  Food Insecurity:   . Worried About Charity fundraiser in the Last Year: Not on file  . Ran Out of Food in the Last Year: Not on file  Transportation Needs:   . Lack of Transportation (Medical): Not on file  . Lack of Transportation (Non-Medical): Not on file  Physical Activity:   . Days of Exercise per Week: Not on file  . Minutes of Exercise per Session: Not on file  Stress:   . Feeling of Stress : Not on file  Social Connections:   . Frequency of Communication with Friends and Family: Not on file  . Frequency of Social Gatherings with Friends and Family: Not on file  . Attends Religious Services: Not on file  . Active Member of Clubs or  Organizations: Not on file  . Attends Archivist Meetings: Not on file  . Marital Status: Not on file  Intimate Partner Violence:   . Fear of Current or Ex-Partner: Not on file  . Emotionally Abused: Not on file  . Physically Abused: Not on file  . Sexually Abused: Not on file    Family History  Problem Relation Age of Onset  . Heart attack Mother   . Diabetes Mother   . Asthma Mother   . Heart attack Father   . Diabetes Sister   . Hyperlipidemia Sister   . Allergic rhinitis Neg Hx   . Angioedema Neg Hx   . Eczema Neg Hx   . Immunodeficiency Neg Hx   . Urticaria Neg Hx    Socially, she is widowed. She has 2 children 5 grandchildren. She does not exercise. She quit smoking 2 years ago. resumed on 08/17/2022 after her dog died.  She is committed to completely discontinue tobacco.   ROS General: Negative; No fevers, chills, or night sweats;  HEENT: Negative; No changes in vision or hearing, sinus congestion, difficulty swallowing Pulmonary: Positive for asthma Cardiovascular: Negative; No chest pain, presyncope, syncope, palpitations GI: positive for GERD; No nausea, vomiting, diarrhea,  or abdominal pain GU: Negative; No dysuria, hematuria, or difficulty voiding Musculoskeletal: She tolerated her hip surgery well. Hematologic/Oncology: Negative; no easy bruising, bleeding Endocrine: Negative; no heat/cold intolerance; no diabetes Neuro: Negative; no changes in balance, headaches Skin: Negative; No rashes or skin lesions Psychiatric: Negative; No behavioral problems, depression Sleep: Negative; No snoring, daytime sleepiness, hypersomnolence, bruxism, restless legs, hypnogognic hallucinations, no cataplexy Other comprehensive 14 point system review is negative.  Nutritionally she cannot have dairy products due to significant milk allergy.  PE BP 140/70   Pulse 70   Ht '5\' 4"'  (1.626 m)   Wt 133 lb (60.3 kg)   LMP  (LMP Unknown)   SpO2 98%   BMI 22.83 kg/m     Repeat blood pressure by me was elevated at 164/74.  Wt Readings from Last 3 Encounters:  04/14/19 133 lb (60.3 kg)  12/14/18 134 lb (60.8 kg)  07/28/18 135 lb (61.2 kg)   General: Alert, oriented, no distress.  Skin: normal turgor, no rashes, warm and dry HEENT: Normocephalic, atraumatic. Pupils equal round and reactive to light; sclera anicteric; extraocular muscles intact;  Nose without nasal septal hypertrophy Mouth/Parynx benign; Mallinpatti scale 3 Neck: No JVD, no carotid bruits; normal carotid upstroke Lungs: clear to ausculatation and percussion; no wheezing or rales Chest wall: without tenderness to palpitation Heart: PMI not displaced, RRR, s1 s2 normal, 1/6 systolic murmur, no diastolic murmur, no rubs, gallops, thrills, or heaves Abdomen: soft, nontender; no hepatosplenomehaly, BS+; abdominal aorta nontender and not dilated by palpation. Back: no CVA tenderness Pulses 2+ Musculoskeletal: full range of motion, normal strength, no joint deformities Extremities: no clubbing cyanosis or edema, Homan's sign negative  Neurologic: grossly nonfocal; Cranial nerves grossly wnl Psychologic: Normal mood and affect   ECG (independently read by me): Normal sinus rhythm at 70 bpm.  New inferior and anterolateral T wave inversion.  PR interval 170 ms, QTc interval 444 ms; an isolated PVC  September 2020 ECG (independently read by me): Sinus bradycardia at 58 bpm.  Nonspecific ST changes.  Poor R wave progression V1 through V3  January 2020 ECG (independently read by me): Normal sinus rhythm at 61 bpm.  No ectopy.  Specific ST change in lead III.  February 2019 ECG (independently read by me): Normal sinus rhythm at 72 bpm.  Nonspecific ST changes inferiorly.  QTc interval 451 ms.  June 2018 ECG (independently read by me): Sinus bradycardia 57 bpm.  No significant ST-T changes.  Normal intervals.  January 2018 ECG (independently read by me): Normal sinus rhythm at 69 bpm.  No  ectopy.  Normal intervals.  November 2016 ECG (independently read by me): Normal sinus rhythm at 60 bpm.  No significant ST-T changes.  Normal intervals.  November 2015 ECG (independently read by me): Normal sinus rhythm at 72 bpm.  Normal intervals.  No significant ST-T changes.  October 2014 ECG: Sinus rhythm with occasional isolated PVC. Heart rate 68 beats per minute. QTc interval 440 ms.  LAB: BMP Latest Ref Rng & Units 12/09/2018 11/03/2017 02/08/2017  Glucose 65 - 99 mg/dL 93 95 99  BUN 8 - 27 mg/dL '16 14 14  ' Creatinine 0.57 - 1.00 mg/dL 0.61 0.54(L) 0.54(L)  BUN/Creat Ratio 12 - '28 26 26 26  ' Sodium 134 - 144 mmol/L 138 142 144  Potassium 3.5 - 5.2 mmol/L 4.7 4.9 4.7  Chloride 96 - 106 mmol/L 99 103 104  CO2 20 - 29 mmol/L '27 25 25  ' Calcium 8.7 - 10.3 mg/dL 9.6  9.3 9.6   Hepatic Function Latest Ref Rng & Units 12/09/2018 11/03/2017 02/08/2017  Total Protein 6.0 - 8.5 g/dL 6.7 6.1 6.6  Albumin 3.7 - 4.7 g/dL 4.9(H) 4.5 4.7  AST 0 - 40 IU/L '21 16 19  ' ALT 0 - 32 IU/L '26 22 18  ' Alk Phosphatase 39 - 117 IU/L 63 67 78  Total Bilirubin 0.0 - 1.2 mg/dL 0.3 0.2 0.3   CBC Latest Ref Rng & Units 11/03/2017 02/08/2017 04/27/2016  WBC 3.4 - 10.8 x10E3/uL 4.5 6.7 6.6  Hemoglobin 11.1 - 15.9 g/dL 12.0 13.4 11.7  Hematocrit 34.0 - 46.6 % 36.5 39.5 36.2  Platelets 150 - 450 x10E3/uL 197 185 196   Lab Results  Component Value Date   MCV 92 11/03/2017   MCV 92 02/08/2017   MCV 93.5 04/27/2016   Lab Results  Component Value Date   TSH 2.150 11/03/2017   No results found for: HGBA1C   Lipid Panel     Component Value Date/Time   CHOL 159 12/09/2018 0931   TRIG 46 12/09/2018 0931   HDL 81 12/09/2018 0931   CHOLHDL 2.0 12/09/2018 0931   CHOLHDL 2.0 04/27/2016 0923   VLDL 10 04/27/2016 0923   LDLCALC 68 12/09/2018 0931    RADIOLOGY: No results found.  IMPRESSION:  1. Coronary artery disease involving native coronary artery of native heart with other form of angina pectoris ([place])    2. DOE (dyspnea on exertion)   3. Essential hypertension   4. Hyperlipidemia LDL goal <70   5. Mild asthma without complication, unspecified whether persistent   6. Medication management     ASSESSMENT AND PLAN: Judith Stevens is a 79 year-old white female who had smoked for over 50 years and quit smoking 2 years ago.  Unfortunately, she was under significant stress with the  death of her dog and resumed smoking in May 2018 continued to smoke intermittently but fortunately again quit smoking in March 2020.  She has CAD with a 60% ostial LAD stenosis dating back to 2001 and which again was noted on her repeat catheterization by me in 2009.  We have been aggressively treating her lipids in attempt to induce plaque regression and she has tolerated Lipitor 80 mg in addition to Zetia 10 mg.  Most recent laboratory on December 09, 2018 showed a total cholesterol 159, triglycerides 46, HDL 81, and LDL cholesterol at 68.  A previous coronary CT angiography in December 2018 did not demonstrate significant progression of disease and showed 25-50% stenoses in the LAD and RCA; FFR was negative.  I had seen her in early 2020 and had instituted olmesartan due to continued blood pressure elevation.  She had experienced increased stress at the time of her last evaluation in 01/05/19 due to her sister's death.  At that time her blood pressure was elevated and I further titrated olmesartan.  Over the past 6 to 8 weeks she has noticed more episodes of shortness of breath typically with activity.  She did experience an episode of chest pain approximately 3 weeks ago but denies any recurrent significant chest pain since that time.  However times there is a vague chest pressure sensation which resolves spontaneously.  She denies any rest symptoms.  Her EKG today shows new inferolateral T wave inversion worrisome for ischemia.  I discussed with her at length my concern that this may very well represent progressive  high-grade stenosis and her shortness of breath is most likely due to progressive CAD.  I discussed that she will need definitive cardiac catheterization.  She states she feels well.  She has stated that she would like me to do her catheterization.  I even discussed with her the possibility of potentially admitting her later today for this procedure to be done with one of my colleagues but she would prefer to defer.  I am starting her on isosorbide 30 mg daily as well as metoprolol tartrate 12.5 mg twice a day.  She has not been taking any aspirin due to previous GI issues but I have recommended resumption of aspirin 81 mg.  She has a prescription for sublingual nitroglycerin which she has not required to use.  I will add her to my schedule and see her back in the office on Monday, April 17, 2019 for reevaluation and repeat ECG.  We will plan for cardiac catheterization to be done next week.  However she was advised that if she were to develop increasing symptomatology with shortness of breath or recurrent chest tightness over the weekend that she should present herself to the emergency room and not wait for next week.  At present, she is not short of breath.  She continues to be on Pulmicort in addition to Advair for her asthma.  Her GERD is controlled with Protonix.  She continues to be on Zetia and atorvastatin 80 mg for hyperlipidemia.   Time spent: 35 minutes  Troy Sine, MD, [facility]  04/15/2019 11:21 AM

## 2019-04-14 NOTE — Patient Instructions (Signed)
Medication Instructions:  START- Aspirin 81 mg by mouth daily START- Isosorbide 30 mg by mouth daily START- Metoprolol 12.5 mg by mouth twice a day  *If you need a refill on your cardiac medications before your next appointment, please call your pharmacy*  Lab Work: None Ordered  Testing/Procedures: None Ordered  Follow-Up: At [facility], you and your health needs are our priority.  As part of our continuing mission to provide you with exceptional heart care, we have created designated Provider Care Teams.  These Care Teams include your primary Cardiologist (physician) and Advanced Practice Providers (APPs -  Physician Assistants and Nurse Practitioners) who all work together to provide you with the care you need, when you need it.  Your next appointment:   Monday January 18th @ 1:00 pm

## 2019-04-15 ENCOUNTER — Encounter: Payer: Self-pay | Admitting: Cardiovascular Disease

## 2019-04-17 ENCOUNTER — Encounter: Payer: Self-pay | Admitting: Cardiovascular Disease

## 2019-04-17 ENCOUNTER — Encounter (HOSPITAL_COMMUNITY): Payer: Self-pay | Admitting: Cardiovascular Disease

## 2019-04-17 ENCOUNTER — Ambulatory Visit (INDEPENDENT_AMBULATORY_CARE_PROVIDER_SITE_OTHER): Payer: Medicare Other | Admitting: Cardiovascular Disease

## 2019-04-17 ENCOUNTER — Other Ambulatory Visit: Payer: Self-pay

## 2019-04-17 ENCOUNTER — Inpatient Hospital Stay (HOSPITAL_COMMUNITY)
Admission: AD | Admit: 2019-04-17 | Discharge: 2019-04-18 | DRG: 287 | Disposition: A | Discharge status: Home / Self Care | Payer: Medicare Other | Attending: Cardiovascular Disease | Admitting: Cardiovascular Disease

## 2019-04-17 VITALS — BP 157/67 | HR 56 | Temp 97.2°F | Ht 64.0 in | Wt 134.0 lb

## 2019-04-17 DIAGNOSIS — Z825 Family history of asthma and other chronic lower respiratory diseases: Secondary | ICD-10-CM | POA: Diagnosis not present

## 2019-04-17 DIAGNOSIS — I10 Essential (primary) hypertension: Secondary | ICD-10-CM

## 2019-04-17 DIAGNOSIS — Z720 Tobacco use: Secondary | ICD-10-CM | POA: Diagnosis not present

## 2019-04-17 DIAGNOSIS — Z7952 Long term (current) use of systemic steroids: Secondary | ICD-10-CM | POA: Diagnosis not present

## 2019-04-17 DIAGNOSIS — Z981 Arthrodesis status: Secondary | ICD-10-CM | POA: Diagnosis not present

## 2019-04-17 DIAGNOSIS — E785 Hyperlipidemia, unspecified: Secondary | ICD-10-CM | POA: Diagnosis present

## 2019-04-17 DIAGNOSIS — F419 Anxiety disorder, unspecified: Secondary | ICD-10-CM | POA: Diagnosis present

## 2019-04-17 DIAGNOSIS — I2510 Atherosclerotic heart disease of native coronary artery without angina pectoris: Secondary | ICD-10-CM | POA: Diagnosis not present

## 2019-04-17 DIAGNOSIS — Z833 Family history of diabetes mellitus: Secondary | ICD-10-CM

## 2019-04-17 DIAGNOSIS — R9431 Abnormal electrocardiogram [ECG] [EKG]: Secondary | ICD-10-CM | POA: Diagnosis not present

## 2019-04-17 DIAGNOSIS — I119 Hypertensive heart disease without heart failure: Secondary | ICD-10-CM | POA: Diagnosis present

## 2019-04-17 DIAGNOSIS — Z7982 Long term (current) use of aspirin: Secondary | ICD-10-CM | POA: Diagnosis not present

## 2019-04-17 DIAGNOSIS — F17210 Nicotine dependence, cigarettes, uncomplicated: Secondary | ICD-10-CM | POA: Diagnosis present

## 2019-04-17 DIAGNOSIS — Z79899 Other long term (current) drug therapy: Secondary | ICD-10-CM | POA: Diagnosis not present

## 2019-04-17 DIAGNOSIS — Z888 Allergy status to other drugs, medicaments and biological substances status: Secondary | ICD-10-CM

## 2019-04-17 DIAGNOSIS — J45909 Unspecified asthma, uncomplicated: Secondary | ICD-10-CM

## 2019-04-17 DIAGNOSIS — I493 Ventricular premature depolarization: Secondary | ICD-10-CM | POA: Diagnosis present

## 2019-04-17 DIAGNOSIS — Z8249 Family history of ischemic heart disease and other diseases of the circulatory system: Secondary | ICD-10-CM | POA: Diagnosis not present

## 2019-04-17 DIAGNOSIS — Z882 Allergy status to sulfonamides status: Secondary | ICD-10-CM | POA: Diagnosis not present

## 2019-04-17 DIAGNOSIS — Z881 Allergy status to other antibiotic agents status: Secondary | ICD-10-CM | POA: Diagnosis not present

## 2019-04-17 DIAGNOSIS — I25119 Atherosclerotic heart disease of native coronary artery with unspecified angina pectoris: Secondary | ICD-10-CM | POA: Diagnosis not present

## 2019-04-17 DIAGNOSIS — Z880 Allergy status to penicillin: Secondary | ICD-10-CM | POA: Diagnosis not present

## 2019-04-17 DIAGNOSIS — I200 Unstable angina: Secondary | ICD-10-CM | POA: Diagnosis not present

## 2019-04-17 DIAGNOSIS — Z20822 Contact with and (suspected) exposure to covid-19: Secondary | ICD-10-CM | POA: Diagnosis present

## 2019-04-17 DIAGNOSIS — I25118 Atherosclerotic heart disease of native coronary artery with other forms of angina pectoris: Secondary | ICD-10-CM

## 2019-04-17 DIAGNOSIS — R0609 Other forms of dyspnea: Secondary | ICD-10-CM

## 2019-04-17 DIAGNOSIS — R0600 Dyspnea, unspecified: Secondary | ICD-10-CM

## 2019-04-17 DIAGNOSIS — R0981 Nasal congestion: Secondary | ICD-10-CM | POA: Diagnosis not present

## 2019-04-17 DIAGNOSIS — I25110 Atherosclerotic heart disease of native coronary artery with unstable angina pectoris: Secondary | ICD-10-CM | POA: Diagnosis present

## 2019-04-17 DIAGNOSIS — K219 Gastro-esophageal reflux disease without esophagitis: Secondary | ICD-10-CM | POA: Diagnosis present

## 2019-04-17 HISTORY — DX: Chronic obstructive pulmonary disease, unspecified: J44.9

## 2019-04-17 HISTORY — DX: Nonrheumatic aortic (valve) stenosis: I35.0

## 2019-04-17 LAB — BASIC METABOLIC PANEL
Anion gap: 8 (ref 5–15)
BUN: 14 mg/dL (ref 8–23)
CO2: 30 mmol/L (ref 22–32)
Calcium: 9.2 mg/dL (ref 8.9–10.3)
Chloride: 103 mmol/L (ref 98–111)
Creatinine, Ser: 0.62 mg/dL (ref 0.44–1.00)
GFR calc Af Amer: >60 mL/min (ref >60)
GFR calc non Af Amer: >60 mL/min (ref >60)
Glucose, Bld: 156 mg/dL — ABNORMAL HIGH (ref 70–99)
Potassium: 4.5 mmol/L (ref 3.5–5.1)
Sodium: 141 mmol/L (ref 135–145)

## 2019-04-17 LAB — CBC WITH DIFFERENTIAL/PLATELET
Abs Immature Granulocytes: 0.01 10*3/uL (ref 0.00–0.07)
Basophils Absolute: 0 10*3/uL (ref 0.0–0.1)
Basophils Relative: 0 %
Eosinophils Absolute: 0.1 10*3/uL (ref 0.0–0.5)
Eosinophils Relative: 2 %
HCT: 35.8 % — ABNORMAL LOW (ref 36.0–46.0)
Hemoglobin: 11.9 g/dL — ABNORMAL LOW (ref 12.0–15.0)
Immature Granulocytes: 0 %
Lymphocytes Relative: 34 %
Lymphs Abs: 1.6 10*3/uL (ref 0.7–4.0)
MCH: 31.2 pg (ref 26.0–34.0)
MCHC: 33.2 g/dL (ref 30.0–36.0)
MCV: 93.7 fL (ref 80.0–100.0)
Monocytes Absolute: 0.4 10*3/uL (ref 0.1–1.0)
Monocytes Relative: 8 %
Neutro Abs: 2.6 10*3/uL (ref 1.7–7.7)
Neutrophils Relative %: 56 %
Platelets: 132 10*3/uL — ABNORMAL LOW (ref 150–400)
RBC: 3.82 MIL/uL — ABNORMAL LOW (ref 3.87–5.11)
RDW: 13 % (ref 11.5–15.5)
WBC: 4.8 10*3/uL (ref 4.0–10.5)
nRBC: 0 % (ref 0.0–0.2)

## 2019-04-17 LAB — TROPONIN I (HIGH SENSITIVITY)
Troponin I (High Sensitivity): 10 ng/L (ref <18)
Troponin I (High Sensitivity): 11 ng/L (ref <18)

## 2019-04-17 LAB — SARS CORONAVIRUS 2 (TAT 6-24 HRS): SARS Coronavirus 2: NEGATIVE

## 2019-04-17 MED ORDER — METOPROLOL TARTRATE 12.5 MG HALF TABLET
12.5000 mg | ORAL_TABLET | Freq: Two times a day (BID) | ORAL | Status: DC
  Administered 2019-04-17: 12.5 mg via ORAL
  Filled 2019-04-17: qty 1

## 2019-04-17 MED ORDER — ASPIRIN EC 81 MG PO TBEC: 81.0000 mg | DELAYED_RELEASE_TABLET | Freq: Every day | ORAL | Status: DC

## 2019-04-17 MED ORDER — VITAMIN D 25 MCG (1000 UNIT) PO TABS
2000.0000 [IU] | ORAL_TABLET | Freq: Every day | ORAL | Status: DC
  Filled 2019-04-17: qty 2

## 2019-04-17 MED ORDER — SODIUM CHLORIDE 0.9 % WEIGHT BASED INFUSION
3.0000 mL/kg/h | INTRAVENOUS | Status: AC
  Administered 2019-04-18: 3 mL/kg/h via INTRAVENOUS

## 2019-04-17 MED ORDER — ATORVASTATIN CALCIUM 80 MG PO TABS: 80.0000 mg | ORAL_TABLET | Freq: Every day | ORAL | Status: DC

## 2019-04-17 MED ORDER — ALBUTEROL SULFATE (2.5 MG/3ML) 0.083% IN NEBU: 2.5000 mg | INHALATION_SOLUTION | Freq: Four times a day (QID) | RESPIRATORY_TRACT | Status: DC | PRN

## 2019-04-17 MED ORDER — HEPARIN (PORCINE) 25000 UT/250ML-% IV SOLN
700.0000 [IU]/h | INTRAVENOUS | Status: DC
  Administered 2019-04-17: 700 [IU]/h via INTRAVENOUS
  Filled 2019-04-17: qty 250

## 2019-04-17 MED ORDER — ACETAMINOPHEN 325 MG PO TABS
650.0000 mg | ORAL_TABLET | ORAL | Status: DC | PRN
  Administered 2019-04-17: 650 mg via ORAL
  Filled 2019-04-17 (×2): qty 2

## 2019-04-17 MED ORDER — ISOSORBIDE MONONITRATE ER 30 MG PO TB24
30.0000 mg | ORAL_TABLET | Freq: Every day | ORAL | Status: DC
  Filled 2019-04-17: qty 1

## 2019-04-17 MED ORDER — ESCITALOPRAM OXALATE 10 MG PO TABS
20.0000 mg | ORAL_TABLET | Freq: Every day | ORAL | Status: DC
  Administered 2019-04-17: 20 mg via ORAL
  Filled 2019-04-17: qty 2

## 2019-04-17 MED ORDER — EZETIMIBE 10 MG PO TABS
10.0000 mg | ORAL_TABLET | Freq: Every day | ORAL | Status: DC
  Administered 2019-04-17: 10 mg via ORAL
  Filled 2019-04-17: qty 1

## 2019-04-17 MED ORDER — SODIUM CHLORIDE 0.9% FLUSH: 3.0000 mL | Freq: Two times a day (BID) | INTRAVENOUS | Status: DC

## 2019-04-17 MED ORDER — ASPIRIN 81 MG PO CHEW
324.0000 mg | CHEWABLE_TABLET | ORAL | Status: AC
  Administered 2019-04-17: 17:00:00 324 mg via ORAL
  Filled 2019-04-17: qty 4

## 2019-04-17 MED ORDER — SODIUM CHLORIDE 0.9 % IV SOLN: 250.0000 mL | INTRAVENOUS | Status: DC | PRN

## 2019-04-17 MED ORDER — ONDANSETRON HCL 4 MG/2ML IJ SOLN: 4.0000 mg | Freq: Four times a day (QID) | INTRAMUSCULAR | Status: DC | PRN

## 2019-04-17 MED ORDER — ACETAMINOPHEN-CODEINE #3 300-30 MG PO TABS: 1.0000 | ORAL_TABLET | Freq: Four times a day (QID) | ORAL | Status: DC | PRN

## 2019-04-17 MED ORDER — IRBESARTAN 150 MG PO TABS
150.0000 mg | ORAL_TABLET | Freq: Every day | ORAL | Status: DC
  Administered 2019-04-17: 150 mg via ORAL
  Filled 2019-04-17: qty 1

## 2019-04-17 MED ORDER — ASPIRIN 81 MG PO CHEW
81.0000 mg | CHEWABLE_TABLET | ORAL | Status: AC
  Administered 2019-04-18: 81 mg via ORAL
  Filled 2019-04-17: qty 1

## 2019-04-17 MED ORDER — SODIUM CHLORIDE 0.9% FLUSH: 3.0000 mL | INTRAVENOUS | Status: DC | PRN

## 2019-04-17 MED ORDER — SALINE SPRAY 0.65 % NA SOLN
1.0000 | NASAL | Status: DC | PRN
  Administered 2019-04-17: 1 via NASAL
  Filled 2019-04-17: qty 44

## 2019-04-17 MED ORDER — NITROGLYCERIN 0.4 MG SL SUBL: 0.4000 mg | SUBLINGUAL_TABLET | SUBLINGUAL | Status: DC | PRN

## 2019-04-17 MED ORDER — ALPRAZOLAM 0.5 MG PO TABS: 1.0000 mg | ORAL_TABLET | Freq: Three times a day (TID) | ORAL | Status: DC | PRN

## 2019-04-17 MED ORDER — ASPIRIN 300 MG RE SUPP: 300.0000 mg | RECTAL | Status: AC

## 2019-04-17 MED ORDER — PANTOPRAZOLE SODIUM 40 MG PO TBEC
40.0000 mg | DELAYED_RELEASE_TABLET | Freq: Two times a day (BID) | ORAL | Status: DC
  Administered 2019-04-17: 40 mg via ORAL
  Filled 2019-04-17: qty 1

## 2019-04-17 MED ORDER — HEPARIN BOLUS VIA INFUSION
4000.0000 [IU] | Freq: Once | INTRAVENOUS | Status: AC
  Administered 2019-04-17: 4000 [IU] via INTRAVENOUS
  Filled 2019-04-17: qty 4000

## 2019-04-17 MED ORDER — DILTIAZEM HCL ER COATED BEADS 180 MG PO CP24
300.0000 mg | ORAL_CAPSULE | Freq: Every day | ORAL | Status: DC
  Administered 2019-04-17: 300 mg via ORAL
  Filled 2019-04-17: qty 1

## 2019-04-17 MED ORDER — SODIUM CHLORIDE 0.9 % WEIGHT BASED INFUSION: 1.0000 mL/kg/h | INTRAVENOUS | Status: DC

## 2019-04-17 NOTE — Progress Notes (Signed)
ANTICOAGULATION CONSULT NOTE - Initial Consult  Pharmacy Consult for heparin Indication: chest pain/ACS  Allergies  Allergen Reactions  . Cefzil [Cefprozil] Shortness Of Breath  . Keflex [Cephalexin] Shortness Of Breath and Rash  . Lactose Intolerance (Gi) Shortness Of Breath    ALL DAIRY PRODUCTS  . Levaquin [Levofloxacin] Shortness Of Breath and Rash  . Naproxen Shortness Of Breath    Shortness of breath Shortness of breath  . Oxycodone-Acetaminophen Nausea And Vomiting and Shortness Of Breath  . Peanuts [Peanut Oil] Shortness Of Breath  . Penicillins Shortness Of Breath  . Percocet [Oxycodone-Acetaminophen] Shortness Of Breath  . Red Dye Shortness Of Breath  . Sulfa Antibiotics Shortness Of Breath  . Sulfacetamide Sodium Shortness Of Breath  . Tetracycline Hcl Shortness Of Breath  . Tetracyclines & Related Shortness Of Breath  . Methocarbamol Nausea And Vomiting  . Prednisone Other (See Comments)    Rash  . Tomato Other (See Comments)    SOB  . Amoxicillin-Pot Clavulanate Rash  . Ibuprofen Rash  . Moxifloxacin Rash  . Penicillin G Rash  . Sulfamethoxazole-Trimethoprim Rash  . Tetracycline Rash  . Tramadol Rash    Patient Measurements: Height: 5\' 4"  (162.6 cm) Weight: 131 lb 8 oz (59.6 kg) IBW/kg (Calculated) : 54.7 Heparin Dosing Weight: 59kg  Vital Signs: Temp: 97.5 F (36.4 C) (01/18 1502) Temp Source: Oral (01/18 1502) BP: 157/62 (01/18 1502) Pulse Rate: 56 (01/18 1502)  Labs: No results for input(s): HGB, HCT, PLT, APTT, LABPROT, INR, HEPARINUNFRC, HEPRLOWMOCWT, CREATININE, CKTOTAL, CKMB, TROPONINIHS in the last 72 hours.  CrCl cannot be calculated (Patient's most recent lab result is older than the maximum 21 days allowed.).   Medical History: Past Medical History:  Diagnosis Date  . Anxiety   . Aortic valve stenosis   . Arthritis   . Asthma   . CAD (coronary artery disease)    single vessel  . COPD (chronic obstructive pulmonary disease)  (HCC)   . GERD (gastroesophageal reflux disease)   . Headache   . History of stress test 10/2011   Resting images reveal a normal pattern of perfusion in all regions. The post stress myocardial perfusion images show a normal pattern of perfusion in all region. The post stress left ventricle is normal size. the rest left ventricle is normal size, there is no scintigraphic evidence of inductible myocardial ischemia.  11/2011 Hx of echocardiogram 09/2009   EF >55% Normal Echo  . Hyperlipidemia   . Hypertension   . Migraines   . Pneumonia    hx of  . Pulmonary emboli (HCC) 5/12  . Shortness of breath dyspnea    exertion or eating something shes allergic too  . Tobacco abuse      Assessment: 32 yoF with known CAD admitted directly from cardiology office with concerns for ACS. No AC noted PTA, cath planned for tomorrow.  Goal of Therapy:  Heparin level 0.3-0.7 units/ml Monitor platelets by anticoagulation protocol: Yes   Plan:  -Heparin 4000 units x1 then 700 units/hr -Check 8hr heparin level -Daily heparin level and CBC   75, PharmD, BCPS Clinical Pharmacist [phone] Please check AMION for all [facility] Pharmacy numbers 04/17/2019

## 2019-04-17 NOTE — H&P (View-Only) (Signed)
Patient ID: Judith Stevens, female   DOB: [birth year], 79 y.o.   MRN: [id number]     HPI: Judith Stevens is a 79 y.o. female was seen in the office on April 14, 2019.  She was asked to return to the office today for follow-up evaluation.  Judith Stevens has CAD and in 2001 underwent cardiac catheterization while in [place] which showed a 60% ostial narrowing of her LAD. Repeat catheterization by me in 2009 did not show any significant interval change. Unfortunately, she has continued her ongoing tobacco habit but is now smoking significantly reduced at less than one quarter pack per day.  She has a history of hypertension as well as hyperlipidemia.  A nuclear perfusion study in August 2013 which was unchanged from 2 years previously and continued to show normal perfusion.   In December 2015 prior to undergoing right hip surgery a 2 year follow-up nuclear perfusion study on 03/13/2014 was performed preoperatively.  This continued to show normal perfusion without scar or ischemia.  She tolerated her hip surgery well.  She had smoked for 50 years before finally quitting smoking when I had seen her last.  When I last saw he 2 months ago, she had complained of experiencing increasing shortness of breath of 6 weeks duration that was not consistently exertionally precipitated.   She admitted to fatigue.  She is unaware of palpitations.  She has hyperlipidemia and has been taking atorvastatin 80 mg and Zetia 10 mg in attempt to continue to induce plaque regression.    An echo Doppler study on 02/18/2015 showed an EF of 55-60%, moderate thickening and calcification of a trileaflet aortic valve, consistent with sclerosis but without stenosis and mitral annular calcification.  Laboratory revealed excellent lipid studies with a total cholesterol 149, triglycerides 55, HDL 85 and her LDL was now 53.  Thyroid function studies were normal.  Her renal function was excellent.  LFTs were normal.  Vitamin D was  normal at 41 and her vitamin B-12 level was normal at 448.    She was seen by [name] in October 2017 with complaints of dyspnea on exertion.  She has a history of asthma as well as numerous allergies to foods and some drugs.  She tells me she had the flu in early January 2018.  When I last saw her in January 2018.  She had stop smoking.  She did have complaints of dyspnea.  I recommended a follow-up echo Doppler study which was done on 04/29/2016.  This showed normal LV function and normal diastolic parameters.  The aortic valve was mildly calcified and was without stenosis.  There was mild aortic insufficiency.  Her mitral valve annulus was calcified and leaflets were mildly thickened.  Her left atrium was mildly dilated.   When I saw her in November 2018 she had been taking caring for her sister who had undergone aortic stent grafting.  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  When I saw her, I recommended complete smoking cessation and suggested a pulmonary evaluation with Dr. Curt Jews.   She underwent a coronary CT, which showed a calcium score of 81 which was 47 percentile for age and sex matched control.  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  There is right dominance.  There was diffuse calcified plaque in the RCA and proximal LAD with narrowing of 25-50%.  7.  She  underwent FFR analysis with both lesions being in the normal FFR range of greater than 0.80.  She has significantly reduced her tobacco and now smokes very intermittently.   I saw her in May 04, 2017 currently at that time it appeared that she was on both low-dose amlodipine in addition to diltiazem.  I discontinued amlodipine and further titrated diltiazem.  Over the past year she has done fairly well.  She had seen Hal Manning, PA C in August 2019.  She had experienced occasional twinges of chest pain.  He had  undergone cataract surgery.  I saw her in January 2020 in the office and  in April 2020 in a telemedicine visit.  At that time, she was tolerating olmesartan 20 mg with her diltiazem for blood pressure control.  There was concerns of GI issues and she is in need for a colonoscopy.  She had been evaluated by Dr. Hung and this had been deferred with the COVID-19 pandemic.    I last saw her in September 2020.  At that time she denied any chest pain PND orthopnea.  Her blood pressure had been stable typically running in the 120s to 140s at home.  Recently, her sister died with cardiogenic shock and sepsis.  She has seen Dr. Bobbitt for allergies.  During that evaluation, her blood pressure was elevated at 150/64.  She had been under increased stress particularly with her sister's death.  I recommended further titration of olmesartan to 30 mg she was continue to take diltiazem CD 300 mg daily.  I recommended that if her blood pressure consistently stayed above 135 systolically further titration to olmesartan 40 mg would be necessary.  I saw Judith Stevens in the office on April 14, 2019.  Over the last several months she had felt fairly well but for the past 6 to 8 weeks she has noticed more shortness of breath than she had experienced previously.  Approximately 3 weeks ago, she had an episode of chest tightness which resolved.  She denies any significant chest pain since that time and denies any nocturnal symptoms.  She has experienced shortness of breath with activity and has taken a terbutaline tablet which she had had and noted improvement.  She denies any PND orthopnea.  She denies any nocturnal chest tightness.  During her evaluation, her ECG showed sinus rhythm at 70 bpm with an isolated PVC and new inferior and anterolateral T wave inversion of 3 to 5 mm in leads II 3 and F, V4 through V6.  During that evaluation I discussed with her my concern for her change in ECG with new T wave inversion.  She told me  she had not had an episode of chest pain for over 3 weeks.  However she was noticing shortness of breath with less activity.  I discussed options including admission to the hospital on that day versus adding additional medication to her regimen.  At the time she preferred that I do her cardiac catheterization.  I added isosorbide 30 mg daily to her medical regimen as well as metoprolol tartrate 12.5 mg twice daily I recommended reinstitution of aspirin which she had discontinued.  She continues to be on atorvastatin 80 mg daily, olmesartan 30 mg for blood pressure.  She denied any nocturnal symptoms.  I recommended that she return to the office today April 17, 2019 for reassessment of the effects of her medical therapy and repeat ECG.  Over the weekend, she actually has felt significantly better and denies   any chest tightness or shortness of breath.  However, her EKG in the office today now shows sinus bradycardia at 51 bpm with more pronounced T wave abnormality of approximately 10 mm in V3 through V6 in addition to 4 mm inferiorly.  As result, admission to the hospital was recommended and she is now admitted with plans to institute heparinization and as long as she is stable plan cardiac catheterization tomorrow or later today if symptoms change.  Past Medical History:  Diagnosis Date  . Anxiety   . Arthritis   . Asthma   . CAD (coronary artery disease)    single vessel  . GERD (gastroesophageal reflux disease)   . Headache   . History of stress test 10/2011   Resting images reveal a normal pattern of perfusion in all regions. The post stress myocardial perfusion images show a normal pattern of perfusion in all region. The post stress left ventricle is normal size. the rest left ventricle is normal size, there is no scintigraphic evidence of inductible myocardial ischemia.  . Hx of echocardiogram 09/2009   EF >55% Normal Echo  . Hyperlipidemia   . Hypertension   . Migraines   . Pneumonia    hx  of  . Pulmonary emboli (HCC) 5/12  . Shortness of breath dyspnea    exertion or eating something shes allergic too  . Tobacco abuse     Past Surgical History:  Procedure Laterality Date  . ADENOIDECTOMY    . BACK SURGERY  2012  . CARDIAC CATHETERIZATION  10/13/2007   60% narrowing twin LAD system  . FOOT SURGERY  2002,2013   right  . KNEE SURGERY  01/17/2007   right  . SACROILIAC JOINT FUSION Right 03/28/2014   Procedure: SACROILIAC JOINT FUSION;  Surgeon: Mark Leonard Dumonski, MD;  Location: [facility] OR;  Service: Orthopedics;  Laterality: Right;  Right sided sacroliac joint fusion  . TONSILLECTOMY      Allergies  Allergen Reactions  . Cefzil [Cefprozil] Shortness Of Breath  . Keflex [Cephalexin] Shortness Of Breath and Rash  . Lactose Intolerance (Gi) Shortness Of Breath    ALL DAIRY PRODUCTS  . Levaquin [Levofloxacin] Shortness Of Breath and Rash  . Naproxen Shortness Of Breath    Shortness of breath Shortness of breath  . Oxycodone-Acetaminophen Nausea And Vomiting and Shortness Of Breath  . Peanuts [Peanut Oil] Shortness Of Breath  . Penicillins Shortness Of Breath  . Percocet [Oxycodone-Acetaminophen] Shortness Of Breath  . Red Dye Shortness Of Breath  . Sulfa Antibiotics Shortness Of Breath  . Sulfacetamide Sodium Shortness Of Breath  . Tetracycline Hcl Shortness Of Breath  . Tetracyclines & Related Shortness Of Breath  . Methocarbamol Nausea And Vomiting  . Prednisone Other (See Comments)    Rash  . Tomato Other (See Comments)    SOB  . Amoxicillin-Pot Clavulanate Rash  . Ibuprofen Rash  . Moxifloxacin Rash  . Penicillin G Rash  . Sulfamethoxazole-Trimethoprim Rash  . Tetracycline Rash  . Tramadol Rash    Current Outpatient Medications  Medication Sig Dispense Refill  . ALPRAZolam (XANAX) 1 MG tablet TAKE 1 TABLET UP TO THREE TIMES PER DAY FOR ANXIETY    . aspirin EC 81 MG tablet Take 81 mg by mouth daily.    . atorvastatin (LIPITOR) 80 MG tablet Take 1  tablet (80 mg total) by mouth daily. 90 tablet 3  . budesonide (PULMICORT) 0.5 MG/2ML nebulizer solution USE AS DIRECTED FOR NASAL RINSE 120 mL 0  . butalbital-acetaminophen-caffeine (  FIORICET) 50-325-40 MG tablet Take 1 tablet by mouth every 6 (six) hours as needed.    . Cholecalciferol (VITAMIN D3) 2000 UNITS TABS Take 2,000 Units by mouth daily.     . diltiazem (CARDIZEM CD) 300 MG 24 hr capsule Take 1 capsule (300 mg total) by mouth at bedtime. 90 capsule 3  . escitalopram (LEXAPRO) 20 MG tablet Take 1 tablet (20 mg total) by mouth daily. 90 tablet 0  . ezetimibe (ZETIA) 10 MG tablet Take 1 tablet (10 mg total) by mouth daily. 90 tablet 3  . Fluticasone-Salmeterol (ADVAIR DISKUS) 250-50 MCG/DOSE AEPB Inhale 1 puff into the lungs 2 (two) times daily.    . isosorbide mononitrate (IMDUR) 30 MG 24 hr tablet Take 1 tablet (30 mg total) by mouth daily. 30 tablet 3  . metoprolol tartrate (LOPRESSOR) 25 MG tablet Take 0.5 tablets (12.5 mg total) by mouth 2 (two) times daily. 90 tablet 3  . Multiple Vitamin (MULTI-VITAMIN DAILY PO) Take 1 tablet by mouth daily.    . nitroGLYCERIN (NITROSTAT) 0.4 MG SL tablet Place 1 tablet (0.4 mg total) under the tongue every 5 (five) minutes as needed for chest pain. Needs appointment for future refills 25 tablet 0  . olmesartan (BENICAR) 20 MG tablet Take 1.5 tablets (30 mg total) by mouth daily. Take 30-40mg as needed for BP 90 tablet 3  . ondansetron (ZOFRAN) 4 MG tablet Take 4 mg by mouth every 8 (eight) hours as needed for nausea.    . pantoprazole (PROTONIX) 40 MG tablet Take 1 tablet by mouth 2 (two) times daily.    . Phenylephrine HCl ([employer] SINUS PE DECONGESTANT PO) Take by mouth.    . predniSONE (DELTASONE) 10 MG tablet Take 3 tablets PO for 3 days, then take 2 tablets PO for 3 days, then take 1 tablet PO for 3 days.    . terbutaline (BRETHINE) 5 MG tablet Take 5 mg by mouth every 6 (six) hours as needed (asthma).     . VENTOLIN HFA 108 (90 Base) MCG/ACT  inhaler INHALE 2 PUFFS INTO THE LUNGS EVERY 6 (SIX) HOURS AS NEEDED FOR WHEEZING.  11   No current facility-administered medications for this visit.    Social History   Socioeconomic History  . Marital status: Widowed    Spouse name: Not on file  . Number of children: Not on file  . Years of education: Not on file  . Highest education level: Not on file  Occupational History  . Not on file  Tobacco Use  . Smoking status: Current Some Day Smoker    Packs/day: 0.10    Types: Cigarettes    Start date: 01/30/2014  . Smokeless tobacco: Former User    Quit date: 01/29/2014  Substance and Sexual Activity  . Alcohol use: Yes    Alcohol/week: 4.0 standard drinks    Types: 2 Glasses of wine, 2 Cans of beer per week  . Drug use: No  . Sexual activity: Not on file  Other Topics Concern  . Not on file  Social History Narrative  . Not on file   Social Determinants of Health   Financial Resource Strain:   . Difficulty of Paying Living Expenses: Not on file  Food Insecurity:   . Worried About Running Out of Food in the Last Year: Not on file  . Ran Out of Food in the Last Year: Not on file  Transportation Needs:   . Lack of Transportation (Medical): Not on file  .   Lack of Transportation (Non-Medical): Not on file  Physical Activity:   . Days of Exercise per Week: Not on file  . Minutes of Exercise per Session: Not on file  Stress:   . Feeling of Stress : Not on file  Social Connections:   . Frequency of Communication with Friends and Family: Not on file  . Frequency of Social Gatherings with Friends and Family: Not on file  . Attends Religious Services: Not on file  . Active Member of Clubs or Organizations: Not on file  . Attends Club or Organization Meetings: Not on file  . Marital Status: Not on file  Intimate Partner Violence:   . Fear of Current or Ex-Partner: Not on file  . Emotionally Abused: Not on file  . Physically Abused: Not on file  . Sexually Abused: Not on file     Family History  Problem Relation Age of Onset  . Heart attack Mother   . Diabetes Mother   . Asthma Mother   . Heart attack Father   . Diabetes Sister   . Hyperlipidemia Sister   . Allergic rhinitis Neg Hx   . Angioedema Neg Hx   . Eczema Neg Hx   . Immunodeficiency Neg Hx   . Urticaria Neg Hx    Socially, she is widowed. She has 2 children 5 grandchildren. She does not exercise. She quit smoking 2 years ago. resumed on May 12 after her dog died.  She is committed to completely discontinue tobacco.   ROS General: Negative; No fevers, chills, or night sweats;  HEENT: Negative; No changes in vision or hearing, sinus congestion, difficulty swallowing Pulmonary: Positive for asthma Cardiovascular: Negative; No chest pain, presyncope, syncope, palpitations GI: positive for GERD; No nausea, vomiting, diarrhea, or abdominal pain GU: Negative; No dysuria, hematuria, or difficulty voiding Musculoskeletal: She tolerated her hip surgery well. Hematologic/Oncology: Negative; no easy bruising, bleeding Endocrine: Negative; no heat/cold intolerance; no diabetes Neuro: Negative; no changes in balance, headaches Skin: Negative; No rashes or skin lesions Psychiatric: Negative; No behavioral problems, depression Sleep: Negative; No snoring, daytime sleepiness, hypersomnolence, bruxism, restless legs, hypnogognic hallucinations, no cataplexy Other comprehensive 14 point system review is negative.  Nutritionally she cannot have dairy products due to significant milk allergy.  PE BP (!) 157/67   Pulse (!) 56   Temp (!) 97.2 F (36.2 C)   Ht 5' 4" (1.626 m)   Wt 134 lb (60.8 kg)   LMP  (LMP Unknown)   SpO2 98%   BMI 23.00 kg/m    Repeat blood pressure by me in the office was 128/70  Wt Readings from Last 3 Encounters:  04/17/19 134 lb (60.8 kg)  04/14/19 133 lb (60.3 kg)  12/14/18 134 lb (60.8 kg)   General: Alert, oriented, no distress.  Skin: normal turgor, no rashes, warm  and dry HEENT: Normocephalic, atraumatic. Pupils equal round and reactive to light; sclera anicteric; extraocular muscles intact;  Nose without nasal septal hypertrophy Mouth/Parynx benign; Mallinpatti scale 3 Neck: No JVD, no carotid bruits; normal carotid upstroke Lungs: clear to ausculatation and percussion; no wheezing or rales Chest wall: without tenderness to palpitation Heart: PMI not displaced, RRR, s1 s2 normal, 1/6 systolic murmur, no diastolic murmur, no rubs, gallops, thrills, or heaves Abdomen: soft, nontender; no hepatosplenomehaly, BS+; abdominal aorta nontender and not dilated by palpation. Back: no CVA tenderness Pulses 2+ Musculoskeletal: full range of motion, normal strength, no joint deformities Extremities: no clubbing cyanosis or edema, Homan's sign negative  Neurologic:   grossly nonfocal; Cranial nerves grossly wnl Psychologic: Normal mood and affect  ECG (independently read by me): Sinus bradycardia 51 bpm.  LVH by voltage criteria.  More deeply inverted inferolateral T wave inversion 8 to 10 mm in the anterolateral leads in 2 to 4 mm inferiorly  April 14, 2019 ECG (independently read by me): Normal sinus rhythm at 70 bpm.  New inferior and anterolateral T wave inversion.  PR interval 170 ms, QTc interval 444 ms; an isolated PVC  September 2020 ECG (independently read by me): Sinus bradycardia at 58 bpm.  Nonspecific ST changes.  Poor R wave progression V1 through V3  January 2020 ECG (independently read by me): Normal sinus rhythm at 61 bpm.  No ectopy.  Specific ST change in lead III.  February 2019 ECG (independently read by me): Normal sinus rhythm at 72 bpm.  Nonspecific ST changes inferiorly.  QTc interval 451 ms.  June 2018 ECG (independently read by me): Sinus bradycardia 57 bpm.  No significant ST-T changes.  Normal intervals.  January 2018 ECG (independently read by me): Normal sinus rhythm at 69 bpm.  No ectopy.  Normal intervals.  November 2016 ECG  (independently read by me): Normal sinus rhythm at 60 bpm.  No significant ST-T changes.  Normal intervals.  November 2015 ECG (independently read by me): Normal sinus rhythm at 72 bpm.  Normal intervals.  No significant ST-T changes.  October 2014 ECG: Sinus rhythm with occasional isolated PVC. Heart rate 68 beats per minute. QTc interval 440 ms.  LAB: BMP Latest Ref Rng & Units 12/09/2018 11/03/2017 02/08/2017  Glucose 65 - 99 mg/dL 93 95 99  BUN 8 - 27 mg/dL 16 14 14  Creatinine 0.57 - 1.00 mg/dL 0.61 0.54(L) 0.54(L)  BUN/Creat Ratio 12 - 28 26 26 26  Sodium 134 - 144 mmol/L 138 142 144  Potassium 3.5 - 5.2 mmol/L 4.7 4.9 4.7  Chloride 96 - 106 mmol/L 99 103 104  CO2 20 - 29 mmol/L 27 25 25  Calcium 8.7 - 10.3 mg/dL 9.6 9.3 9.6   Hepatic Function Latest Ref Rng & Units 12/09/2018 11/03/2017 02/08/2017  Total Protein 6.0 - 8.5 g/dL 6.7 6.1 6.6  Albumin 3.7 - 4.7 g/dL 4.9(H) 4.5 4.7  AST 0 - 40 IU/L 21 16 19  ALT 0 - 32 IU/L 26 22 18  Alk Phosphatase 39 - 117 IU/L 63 67 78  Total Bilirubin 0.0 - 1.2 mg/dL 0.3 0.2 0.3   CBC Latest Ref Rng & Units 11/03/2017 02/08/2017 04/27/2016  WBC 3.4 - 10.8 x10E3/uL 4.5 6.7 6.6  Hemoglobin 11.1 - 15.9 g/dL 12.0 13.4 11.7  Hematocrit 34.0 - 46.6 % 36.5 39.5 36.2  Platelets 150 - 450 x10E3/uL 197 185 196   Lab Results  Component Value Date   MCV 92 11/03/2017   MCV 92 02/08/2017   MCV 93.5 04/27/2016   Lab Results  Component Value Date   TSH 2.150 11/03/2017   No results found for: HGBA1C   Lipid Panel     Component Value Date/Time   CHOL 159 12/09/2018 0931   TRIG 46 12/09/2018 0931   HDL 81 12/09/2018 0931   CHOLHDL 2.0 12/09/2018 0931   CHOLHDL 2.0 04/27/2016 0923   VLDL 10 04/27/2016 0923   LDLCALC 68 12/09/2018 0931    RADIOLOGY: No results found.  IMPRESSION:  1. Coronary artery disease involving native coronary artery of native heart with other form of angina pectoris (HCC)   2. DOE (dyspnea on exertion)     3. Essential  hypertension   4. Hyperlipidemia LDL goal <70   5. Mild asthma without complication, unspecified whether persistent     ASSESSMENT AND PLAN: Ms. Happel is a 78 year-old white female who had smoked for over 50 years and quit smoking 2 years ago.  Unfortunately, she was under significant stress with the  death of her dog and resumed smoking in May 2018 continued to smoke intermittently but fortunately again quit smoking in March 2020.  She has CAD with a 60% ostial LAD stenosis dating back to 2001 and which again was noted on her repeat catheterization by me in 2009.  We have been aggressively treating her lipids in attempt to induce plaque regression and she has tolerated Lipitor 80 mg in addition to Zetia 10 mg.  Most recent laboratory on December 09, 2018 showed a total cholesterol 159, triglycerides 46, HDL 81, and LDL cholesterol at 68.  A previous coronary CT angiography in December 2018 did not demonstrate significant progression of disease and showed 25-50% stenoses in the LAD and RCA; FFR was negative.  I had seen her in early 2020 and had instituted olmesartan due to continued blood pressure elevation.  She had experienced increased stress at the time of her last evaluation in September 2020 due to her sister's death.  At that time her blood pressure was elevated and I further titrated olmesartan.  Over the past 6 to 8 weeks she has noticed more episodes of shortness of breath typically with activity.  She experienced an episode of chest pain approximately 3 weeks ago but denies any recurrent significant chest pain since that time.  However times there is a vague chest pressure sensation which resolves spontaneously.  She denies any rest symptoms.  When I saw her on April 14, 2019 her ECG showed new inferolateral T wave inversion worrisome for ischemia.  During that evaluation I discussed with her potential admission to the hospital but she preferred not to go to the hospital if at all possible  with plans for elective catheterization.  I added isosorbide mononitrate 30 mg in addition to metoprolol tartrate 12.5 mg to her medical regimen.  Over the last several days she has felt significantly improved since taking this medication.  Her resting pulse now is 51 however ECG in the office today shows more deeply inverted T wave inversion.  She was here in the office with her daughter today.  At present she feels well and denies chest pain or shortness of breath with the medication adjustments.  However, with her progressive ECG changes I have strongly recommended admission to the hospital today.  She will be a direct admit with plans for initiation of heparinization upon arrival.  She will ultimately need a cardiac catheterization potentially to be done tomorrow however if she develops symptomatology today this should be done later today.  I Signed  Laron Angelini A. Chardonay Scritchfield, MD, FACC  04/17/2019 2:16 PM    

## 2019-04-17 NOTE — Progress Notes (Signed)
Patient ID: Judith Stevens, female   DOB: 1941-03-03, 79 y.o.   MRN: [id number]     HPI: Judith Stevens is a 79 y.o. female was seen in the office on April 14, 2019.  She was asked to return to the office today for follow-up evaluation.  Judith Stevens has CAD and in 2001 underwent cardiac catheterization while in [place] which showed a 60% ostial narrowing of her LAD. Repeat catheterization by me in 2009 did not show any significant interval change. Unfortunately, she has continued her ongoing tobacco habit but is now smoking significantly reduced at less than one quarter pack per day.  She has a history of hypertension as well as hyperlipidemia.  A nuclear perfusion study in August 2013 which was unchanged from 2 years previously and continued to show normal perfusion.   In December 2015 prior to undergoing right hip surgery a 2 year follow-up nuclear perfusion study on 03/13/2014 was performed preoperatively.  This continued to show normal perfusion without scar or ischemia.  She tolerated her hip surgery well.  She had smoked for 50 years before finally quitting smoking when I had seen her last.  When I last saw he 2 months ago, she had complained of experiencing increasing shortness of breath of 6 weeks duration that was not consistently exertionally precipitated.   She admitted to fatigue.  She is unaware of palpitations.  She has hyperlipidemia and has been taking atorvastatin 80 mg and Zetia 10 mg in attempt to continue to induce plaque regression.    An echo Doppler study on 02/18/2015 showed an EF of 55-60%, moderate thickening and calcification of a trileaflet aortic valve, consistent with sclerosis but without stenosis and mitral annular calcification.  Laboratory revealed excellent lipid studies with a total cholesterol 149, triglycerides 55, HDL 85 and her LDL was now 53.  Thyroid function studies were normal.  Her renal function was excellent.  LFTs were normal.  Vitamin D was  normal at 41 and her vitamin B-12 level was normal at 448.    She was seen by [name] in October 2017 with complaints of dyspnea on exertion.  She has a history of asthma as well as numerous allergies to foods and some drugs.  She tells me she had the flu in early January 2018.  When I last saw her in January 2018.  She had stop smoking.  She did have complaints of dyspnea.  I recommended a follow-up echo Doppler study which was done on 04/29/2016.  This showed normal LV function and normal diastolic parameters.  The aortic valve was mildly calcified and was without stenosis.  There was mild aortic insufficiency.  Her mitral valve annulus was calcified and leaflets were mildly thickened.  Her left atrium was mildly dilated.   When I saw her in November 2018 she had been taking caring for her sister who had undergone aortic stent grafting.  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  When I saw her, I recommended complete smoking cessation and suggested a pulmonary evaluation with Dr. Curt Jews.   She underwent a coronary CT, which showed a calcium score of 81 which was 68 percentile for age and sex matched control.  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  There is right dominance.  There was diffuse calcified plaque in the RCA and proximal LAD with narrowing of 25-50%.  7.  She  underwent FFR analysis with both lesions being in the normal FFR range of greater than 0.80.  She has significantly reduced her tobacco and now smokes very intermittently.   I saw her in May 04, 2017 currently at that time it appeared that she was on both low-dose amlodipine in addition to diltiazem.  I discontinued amlodipine and further titrated diltiazem.  Over the past year she has done fairly well.  She had seen Mammie Russian, PA C in August 2019.  She had experienced occasional twinges of chest pain.  He had  undergone cataract surgery.  I saw her in January 2020 in the office and  in April 2020 in a telemedicine visit.  At that time, she was tolerating olmesartan 20 mg with her diltiazem for blood pressure control.  There was concerns of GI issues and she is in need for a colonoscopy.  She had been evaluated by Dr. Benson Norway and this had been deferred with the COVID-19 pandemic.    I last saw her in September 2020.  At that time she denied any chest pain PND orthopnea.  Her blood pressure had been stable typically running in the 120s to 140s at home.  Recently, her sister died with cardiogenic shock and sepsis.  She has seen Dr. Verlin Fester for allergies.  During that evaluation, her blood pressure was elevated at 150/64.  She had been under increased stress particularly with her sister's death.  I recommended further titration of olmesartan to 30 mg she was continue to take diltiazem CD 300 mg daily.  I recommended that if her blood pressure consistently stayed above 007 systolically further titration to olmesartan 40 mg would be necessary.  I saw Judith Stevens in the office on April 14, 2019.  Over the last several months she had felt fairly well but for the past 6 to 8 weeks she has noticed more shortness of breath than she had experienced previously.  Approximately 3 weeks ago, she had an episode of chest tightness which resolved.  She denies any significant chest pain since that time and denies any nocturnal symptoms.  She has experienced shortness of breath with activity and has taken a terbutaline tablet which she had had and noted improvement.  She denies any PND orthopnea.  She denies any nocturnal chest tightness.  During her evaluation, her ECG showed sinus rhythm at 70 bpm with an isolated PVC and new inferior and anterolateral T wave inversion of 3 to 5 mm in leads II 3 and F, V4 through V6.  During that evaluation I discussed with her my concern for her change in ECG with new T wave inversion.  She told me  she had not had an episode of chest pain for over 3 weeks.  However she was noticing shortness of breath with less activity.  I discussed options including admission to the hospital on that day versus adding additional medication to her regimen.  At the time she preferred that I do her cardiac catheterization.  I added isosorbide 30 mg daily to her medical regimen as well as metoprolol tartrate 12.5 mg twice daily I recommended reinstitution of aspirin which she had discontinued.  She continues to be on atorvastatin 80 mg daily, olmesartan 30 mg for blood pressure.  She denied any nocturnal symptoms.  I recommended that she return to the office today April 17, 2019 for reassessment of the effects of her medical therapy and repeat ECG.  Over the weekend, she actually has felt significantly better and denies  any chest tightness or shortness of breath.  However, her EKG in the office today now shows sinus bradycardia at 51 bpm with more pronounced T wave abnormality of approximately 10 mm in V3 through V6 in addition to 4 mm inferiorly.  As result, admission to the hospital was recommended and she is now admitted with plans to institute heparinization and as long as she is stable plan cardiac catheterization tomorrow or later today if symptoms change.  Past Medical History:  Diagnosis Date  . Anxiety   . Arthritis   . Asthma   . CAD (coronary artery disease)    single vessel  . GERD (gastroesophageal reflux disease)   . Headache   . History of stress test 10/2011   Resting images reveal a normal pattern of perfusion in all regions. The post stress myocardial perfusion images show a normal pattern of perfusion in all region. The post stress left ventricle is normal size. the rest left ventricle is normal size, there is no scintigraphic evidence of inductible myocardial ischemia.  Marland Kitchen Hx of echocardiogram 09/2009   EF >55% Normal Echo  . Hyperlipidemia   . Hypertension   . Migraines   . Pneumonia    hx  of  . Pulmonary emboli ([place]) 5/12  . Shortness of breath dyspnea    exertion or eating something shes allergic too  . Tobacco abuse     Past Surgical History:  Procedure Laterality Date  . ADENOIDECTOMY    . BACK SURGERY  2012  . CARDIAC CATHETERIZATION  10/13/2007   60% narrowing twin LAD system  . FOOT SURGERY  2002,2013   right  . KNEE SURGERY  01/17/2007   right  . SACROILIAC JOINT FUSION Right 03/28/2014   Procedure: SACROILIAC JOINT FUSION;  Surgeon: Sinclair Ship, MD;  Location: [place];  Service: Orthopedics;  Laterality: Right;  Right sided sacroliac joint fusion  . TONSILLECTOMY      Allergies  Allergen Reactions  . Cefzil [Cefprozil] Shortness Of Breath  . Keflex [Cephalexin] Shortness Of Breath and Rash  . Lactose Intolerance (Gi) Shortness Of Breath    ALL DAIRY PRODUCTS  . Levaquin [Levofloxacin] Shortness Of Breath and Rash  . Naproxen Shortness Of Breath    Shortness of breath Shortness of breath  . Oxycodone-Acetaminophen Nausea And Vomiting and Shortness Of Breath  . Peanuts [Peanut Oil] Shortness Of Breath  . Penicillins Shortness Of Breath  . Percocet [Oxycodone-Acetaminophen] Shortness Of Breath  . Red Dye Shortness Of Breath  . Sulfa Antibiotics Shortness Of Breath  . Sulfacetamide Sodium Shortness Of Breath  . Tetracycline Hcl Shortness Of Breath  . Tetracyclines & Related Shortness Of Breath  . Methocarbamol Nausea And Vomiting  . Prednisone Other (See Comments)    Rash  . Tomato Other (See Comments)    SOB  . Amoxicillin-Pot Clavulanate Rash  . Ibuprofen Rash  . Moxifloxacin Rash  . Penicillin G Rash  . Sulfamethoxazole-Trimethoprim Rash  . Tetracycline Rash  . Tramadol Rash    Current Outpatient Medications  Medication Sig Dispense Refill  . ALPRAZolam (XANAX) 1 MG tablet TAKE 1 TABLET UP TO THREE TIMES PER DAY FOR ANXIETY    . aspirin EC 81 MG tablet Take 81 mg by mouth daily.    Marland Kitchen atorvastatin (LIPITOR) 80 MG tablet Take 1  tablet (80 mg total) by mouth daily. 90 tablet 3  . budesonide (PULMICORT) 0.5 MG/2ML nebulizer solution USE AS DIRECTED FOR NASAL RINSE 120 mL 0  . butalbital-acetaminophen-caffeine (  FIORICET) 50-325-40 MG tablet Take 1 tablet by mouth every 6 (six) hours as needed.    . Cholecalciferol (VITAMIN D3) 2000 UNITS TABS Take 2,000 Units by mouth daily.     Marland Kitchen diltiazem (CARDIZEM CD) 300 MG 24 hr capsule Take 1 capsule (300 mg total) by mouth at bedtime. 90 capsule 3  . escitalopram (LEXAPRO) 20 MG tablet Take 1 tablet (20 mg total) by mouth daily. 90 tablet 0  . ezetimibe (ZETIA) 10 MG tablet Take 1 tablet (10 mg total) by mouth daily. 90 tablet 3  . Fluticasone-Salmeterol (ADVAIR DISKUS) 250-50 MCG/DOSE AEPB Inhale 1 puff into the lungs 2 (two) times daily.    . isosorbide mononitrate (IMDUR) 30 MG 24 hr tablet Take 1 tablet (30 mg total) by mouth daily. 30 tablet 3  . metoprolol tartrate (LOPRESSOR) 25 MG tablet Take 0.5 tablets (12.5 mg total) by mouth 2 (two) times daily. 90 tablet 3  . Multiple Vitamin (MULTI-VITAMIN DAILY PO) Take 1 tablet by mouth daily.    . nitroGLYCERIN (NITROSTAT) 0.4 MG SL tablet Place 1 tablet (0.4 mg total) under the tongue every 5 (five) minutes as needed for chest pain. Needs appointment for future refills 25 tablet 0  . olmesartan (BENICAR) 20 MG tablet Take 1.5 tablets (30 mg total) by mouth daily. Take 30-74m as needed for BP 90 tablet 3  . ondansetron (ZOFRAN) 4 MG tablet Take 4 mg by mouth every 8 (eight) hours as needed for nausea.    . pantoprazole (PROTONIX) 40 MG tablet Take 1 tablet by mouth 2 (two) times daily.    .Marland KitchenPhenylephrine HCl ([employer] SINUS PE DECONGESTANT PO) Take by mouth.    . predniSONE (DELTASONE) 10 MG tablet Take 3 tablets PO for 3 days, then take 2 tablets PO for 3 days, then take 1 tablet PO for 3 days.    . terbutaline (BRETHINE) 5 MG tablet Take 5 mg by mouth every 6 (six) hours as needed (asthma).     . VENTOLIN HFA 108 (90 Base) MCG/ACT  inhaler INHALE 2 PUFFS INTO THE LUNGS EVERY 6 (SIX) HOURS AS NEEDED FOR WHEEZING.  11   No current facility-administered medications for this visit.    Social History   Socioeconomic History  . Marital status: Widowed    Spouse name: Not on file  . Number of children: Not on file  . Years of education: Not on file  . Highest education level: Not on file  Occupational History  . Not on file  Tobacco Use  . Smoking status: Current Some Day Smoker    Packs/day: 0.10    Types: Cigarettes    Start date: 01/30/2014  . Smokeless tobacco: Former [employer] developer   Quit date: 01/29/2014  Substance and Sexual Activity  . Alcohol use: Yes    Alcohol/week: 4.0 standard drinks    Types: 2 Glasses of wine, 2 Cans of beer per week  . Drug use: No  . Sexual activity: Not on file  Other Topics Concern  . Not on file  Social History Narrative  . Not on file   Social Determinants of Health   Financial Resource Strain:   . Difficulty of Paying Living Expenses: Not on file  Food Insecurity:   . Worried About RCharity fundraiserin the Last Year: Not on file  . Ran Out of Food in the Last Year: Not on file  Transportation Needs:   . Lack of Transportation (Medical): Not on file  .  Lack of Transportation (Non-Medical): Not on file  Physical Activity:   . Days of Exercise per Week: Not on file  . Minutes of Exercise per Session: Not on file  Stress:   . Feeling of Stress : Not on file  Social Connections:   . Frequency of Communication with Friends and Family: Not on file  . Frequency of Social Gatherings with Friends and Family: Not on file  . Attends Religious Services: Not on file  . Active Member of Clubs or Organizations: Not on file  . Attends Archivist Meetings: Not on file  . Marital Status: Not on file  Intimate Partner Violence:   . Fear of Current or Ex-Partner: Not on file  . Emotionally Abused: Not on file  . Physically Abused: Not on file  . Sexually Abused: Not on file     Family History  Problem Relation Age of Onset  . Heart attack Mother   . Diabetes Mother   . Asthma Mother   . Heart attack Father   . Diabetes Sister   . Hyperlipidemia Sister   . Allergic rhinitis Neg Hx   . Angioedema Neg Hx   . Eczema Neg Hx   . Immunodeficiency Neg Hx   . Urticaria Neg Hx    Socially, she is widowed. She has 2 children 5 grandchildren. She does not exercise. She quit smoking 2 years ago. resumed on 09/07/22 after her dog died.  She is committed to completely discontinue tobacco.   ROS General: Negative; No fevers, chills, or night sweats;  HEENT: Negative; No changes in vision or hearing, sinus congestion, difficulty swallowing Pulmonary: Positive for asthma Cardiovascular: Negative; No chest pain, presyncope, syncope, palpitations GI: positive for GERD; No nausea, vomiting, diarrhea, or abdominal pain GU: Negative; No dysuria, hematuria, or difficulty voiding Musculoskeletal: She tolerated her hip surgery well. Hematologic/Oncology: Negative; no easy bruising, bleeding Endocrine: Negative; no heat/cold intolerance; no diabetes Neuro: Negative; no changes in balance, headaches Skin: Negative; No rashes or skin lesions Psychiatric: Negative; No behavioral problems, depression Sleep: Negative; No snoring, daytime sleepiness, hypersomnolence, bruxism, restless legs, hypnogognic hallucinations, no cataplexy Other comprehensive 14 point system review is negative.  Nutritionally she cannot have dairy products due to significant milk allergy.  PE BP (!) 157/67   Pulse (!) 56   Temp (!) 97.2 F (36.2 C)   Ht 5' 4" (1.626 m)   Wt 134 lb (60.8 kg)   LMP  (LMP Unknown)   SpO2 98%   BMI 23.00 kg/m    Repeat blood pressure by me in the office was 128/70  Wt Readings from Last 3 Encounters:  04/17/19 134 lb (60.8 kg)  04/14/19 133 lb (60.3 kg)  12/14/18 134 lb (60.8 kg)   General: Alert, oriented, no distress.  Skin: normal turgor, no rashes, warm  and dry HEENT: Normocephalic, atraumatic. Pupils equal round and reactive to light; sclera anicteric; extraocular muscles intact;  Nose without nasal septal hypertrophy Mouth/Parynx benign; Mallinpatti scale 3 Neck: No JVD, no carotid bruits; normal carotid upstroke Lungs: clear to ausculatation and percussion; no wheezing or rales Chest wall: without tenderness to palpitation Heart: PMI not displaced, RRR, s1 s2 normal, 1/6 systolic murmur, no diastolic murmur, no rubs, gallops, thrills, or heaves Abdomen: soft, nontender; no hepatosplenomehaly, BS+; abdominal aorta nontender and not dilated by palpation. Back: no CVA tenderness Pulses 2+ Musculoskeletal: full range of motion, normal strength, no joint deformities Extremities: no clubbing cyanosis or edema, Homan's sign negative  Neurologic:  grossly nonfocal; Cranial nerves grossly wnl Psychologic: Normal mood and affect  ECG (independently read by me): Sinus bradycardia 51 bpm.  LVH by voltage criteria.  More deeply inverted inferolateral T wave inversion 8 to 10 mm in the anterolateral leads in 2 to 4 mm inferiorly  April 14, 2019 ECG (independently read by me): Normal sinus rhythm at 70 bpm.  New inferior and anterolateral T wave inversion.  PR interval 170 ms, QTc interval 444 ms; an isolated PVC  September 2020 ECG (independently read by me): Sinus bradycardia at 58 bpm.  Nonspecific ST changes.  Poor R wave progression V1 through V3  January 2020 ECG (independently read by me): Normal sinus rhythm at 61 bpm.  No ectopy.  Specific ST change in lead III.  February 2019 ECG (independently read by me): Normal sinus rhythm at 72 bpm.  Nonspecific ST changes inferiorly.  QTc interval 451 ms.  June 2018 ECG (independently read by me): Sinus bradycardia 57 bpm.  No significant ST-T changes.  Normal intervals.  January 2018 ECG (independently read by me): Normal sinus rhythm at 69 bpm.  No ectopy.  Normal intervals.  November 2016 ECG  (independently read by me): Normal sinus rhythm at 60 bpm.  No significant ST-T changes.  Normal intervals.  November 2015 ECG (independently read by me): Normal sinus rhythm at 72 bpm.  Normal intervals.  No significant ST-T changes.  October 2014 ECG: Sinus rhythm with occasional isolated PVC. Heart rate 68 beats per minute. QTc interval 440 ms.  LAB: BMP Latest Ref Rng & Units 12/09/2018 11/03/2017 02/08/2017  Glucose 65 - 99 mg/dL 93 95 99  BUN 8 - 27 mg/dL _0 Creatinine 0.57 - 1.00 mg/dL 0.61 0.54(L) 0.54(L)  BUN/Creat Ratio 12 - _1 Sodium 134 - 144 mmol/L 138 142 144  Potassium 3.5 - 5.2 mmol/L 4.7 4.9 4.7  Chloride 96 - 106 mmol/L 99 103 104  CO2 20 - 29 mmol/L _2 Calcium 8.7 - 10.3 mg/dL 9.6 9.3 9.6   Hepatic Function Latest Ref Rng & Units 12/09/2018 11/03/2017 02/08/2017  Total Protein 6.0 - 8.5 g/dL 6.7 6.1 6.6  Albumin 3.7 - 4.7 g/dL 4.9(H) 4.5 4.7  AST 0 - 40 IU/L _3 ALT 0 - 32 IU/L _4 Alk Phosphatase 39 - 117 IU/L 63 67 78  Total Bilirubin 0.0 - 1.2 mg/dL 0.3 0.2 0.3   CBC Latest Ref Rng & Units 11/03/2017 02/08/2017 04/27/2016  WBC 3.4 - 10.8 x10E3/uL 4.5 6.7 6.6  Hemoglobin 11.1 - 15.9 g/dL 12.0 13.4 11.7  Hematocrit 34.0 - 46.6 % 36.5 39.5 36.2  Platelets 150 - 450 x10E3/uL 197 185 196   Lab Results  Component Value Date   MCV 92 11/03/2017   MCV 92 02/08/2017   MCV 93.5 04/27/2016   Lab Results  Component Value Date   TSH 2.150 11/03/2017   No results found for: HGBA1C   Lipid Panel     Component Value Date/Time   CHOL 159 12/09/2018 0931   TRIG 46 12/09/2018 0931   HDL 81 12/09/2018 0931   CHOLHDL 2.0 12/09/2018 0931   CHOLHDL 2.0 04/27/2016 0923   VLDL 10 04/27/2016 0923   LDLCALC 68 12/09/2018 0931    RADIOLOGY: No results found.  IMPRESSION:  1. Coronary artery disease involving native coronary artery of native heart with other form of angina pectoris ([place])   2. DOE (dyspnea on exertion)  3. Essential  hypertension   4. Hyperlipidemia LDL goal <70   5. Mild asthma without complication, unspecified whether persistent     ASSESSMENT AND PLAN: Judith Stevens is a 79 year-old white female who had smoked for over 50 years and quit smoking 2 years ago.  Unfortunately, she was under significant stress with the  death of her dog and resumed smoking in May 2018 continued to smoke intermittently but fortunately again quit smoking in March 2020.  She has CAD with a 60% ostial LAD stenosis dating back to 2001 and which again was noted on her repeat catheterization by me in 2009.  We have been aggressively treating her lipids in attempt to induce plaque regression and she has tolerated Lipitor 80 mg in addition to Zetia 10 mg.  Most recent laboratory on December 09, 2018 showed a total cholesterol 159, triglycerides 46, HDL 81, and LDL cholesterol at 68.  A previous coronary CT angiography in December 2018 did not demonstrate significant progression of disease and showed 25-50% stenoses in the LAD and RCA; FFR was negative.  I had seen her in early 2020 and had instituted olmesartan due to continued blood pressure elevation.  She had experienced increased stress at the time of her last evaluation in 2019-01-16 due to her sister's death.  At that time her blood pressure was elevated and I further titrated olmesartan.  Over the past 6 to 8 weeks she has noticed more episodes of shortness of breath typically with activity.  She experienced an episode of chest pain approximately 3 weeks ago but denies any recurrent significant chest pain since that time.  However times there is a vague chest pressure sensation which resolves spontaneously.  She denies any rest symptoms.  When I saw her on April 14, 2019 her ECG showed new inferolateral T wave inversion worrisome for ischemia.  During that evaluation I discussed with her potential admission to the hospital but she preferred not to go to the hospital if at all possible  with plans for elective catheterization.  I added isosorbide mononitrate 30 mg in addition to metoprolol tartrate 12.5 mg to her medical regimen.  Over the last several days she has felt significantly improved since taking this medication.  Her resting pulse now is 51 however ECG in the office today shows more deeply inverted T wave inversion.  She was here in the office with her daughter today.  At present she feels well and denies chest pain or shortness of breath with the medication adjustments.  However, with her progressive ECG changes I have strongly recommended admission to the hospital today.  She will be a direct admit with plans for initiation of heparinization upon arrival.  She will ultimately need a cardiac catheterization potentially to be done tomorrow however if she develops symptomatology today this should be done later today.  I Signed  Troy Sine, MD, [facility]  04/17/2019 2:16 PM

## 2019-04-17 NOTE — Progress Notes (Addendum)
PIV consult: One site obtained by floor RN. Instructed Rey, RN to enter consult in AM for second site prior to cath.

## 2019-04-17 NOTE — Progress Notes (Signed)
VAST consulted to place 2nd IV access for cardiac cath in am. Spoke with pt's nurse Rey regarding vein preservation and waiting until closer to procedure to place IV.

## 2019-04-17 NOTE — H&P (Signed)
CARDIOLOGY HISTORY AND PHYSICAL   Patient ID: Judith Stevens MRN: [id number]  DOB/AGE: [birth year] 79 y.o. Admit date: (Not on file)  HPI: Judith Stevens is a 79 y.o. female was seen in the office on April 14, 2019. She was asked to return to the office today for follow-up evaluation.  Judith Stevens has CAD and in 2001 underwent cardiac catheterization while in [place] which showed a 60% ostial narrowing of her LAD. Repeat catheterization by me in 2009 did not show any significant interval change. Unfortunately, she has continued her ongoing tobacco habit but is now smoking significantly reduced at less than one quarter pack per day. She has a history of hypertension as well as hyperlipidemia. A nuclear perfusion study in August 2013 which was unchanged from 2 years previously and continued to show normal perfusion.  In December 2015 prior to undergoing right hip surgery a 2 year follow-up nuclear perfusion study on 03/13/2014 was performed preoperatively. This continued to show normal perfusion without scar or ischemia. She tolerated her hip surgery well. She had smoked for 50 years before finally quitting smoking when I had seen her last. When I last saw he 2 months ago, she had complained of experiencing increasing shortness of breath of 6 weeks duration that was not consistently exertionally precipitated. She admitted to fatigue. She is unaware of palpitations. She has hyperlipidemia and has been taking atorvastatin 80 mg and Zetia 10 mg in attempt to continue to induce plaque regression.  An echo Doppler study on 02/18/2015 showed an EF of 55-60%, moderate thickening and calcification of a trileaflet aortic valve, consistent with sclerosis but without stenosis and mitral annular calcification. Laboratory revealed excellent lipid studies with a total cholesterol 149, triglycerides 55, HDL 85 and her LDL was now 53. Thyroid function studies were normal. Her renal function was  excellent. LFTs were normal. Vitamin D was normal at 41 and her vitamin B-12 level was normal at 448.  She was seen by [name] in October 2017 with complaints of dyspnea on exertion. She has a history of asthma as well as numerous allergies to foods and some drugs. She tells me she had the flu in early January 2018. When I last saw her in January 2018. She had stop smoking. She did have complaints of dyspnea. I recommended a follow-up echo Doppler study which was done on 04/29/2016. This showed normal LV function and normal diastolic parameters. The aortic valve was mildly calcified and was without stenosis. There was mild aortic insufficiency. Her mitral valve annulus was calcified and leaflets were mildly thickened. Her left atrium was mildly dilated.  When I saw her in November 2018 she had been taking caring for her sister who had undergone aortic stent grafting. 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 When I saw her, I recommended complete smoking cessation and suggested a pulmonary evaluation with Dr. Curt Jews. She underwent a coronary CT, which showed a calcium score of 81 which was 55 percentile for age and sex matched control. 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 There is right dominance. There was diffuse calcified plaque in the RCA and proximal LAD with narrowing of 25-50%. 7. She underwent FFR analysis with both lesions being in the normal FFR range of greater than 0.80. She has significantly reduced her tobacco and now smokes very intermittently.  I saw her in May 04, 2017 currently  at that time it appeared that she was on both low-dose amlodipine in addition to diltiazem. I discontinued amlodipine and further titrated diltiazem. Over the past year she has done fairly well. She had seen Mammie Russian, PA C in August 2019. She had experienced occasional twinges of chest pain.  He had undergone cataract surgery.  I saw her in January 2020 in the office and in April 2020 in a telemedicine visit. At that time, she was tolerating olmesartan 20 mg with her diltiazem for blood pressure control. There was concerns of GI issues and she is in need for a colonoscopy. She had been evaluated by Dr. Benson Norway and this had been deferred with the COVID-19 pandemic.  I last saw her in September 2020. At that time she denied any chest pain PND orthopnea. Her blood pressure had been stable typically running in the 120s to 140s at home. Recently, her sister died with cardiogenic shock and sepsis. She has seen Dr. Verlin Fester for allergies. During that evaluation, her blood pressure was elevated at 150/64. She had been under increased stress particularly with her sister's death. I recommended further titration of olmesartan to 30 mg she was continue to take diltiazem CD 300 mg daily. I recommended that if her blood pressure consistently stayed above 974 systolically further titration to olmesartan 40 mg would be necessary.  I saw Judith Stevens in the office on April 14, 2019. Over the last several months she had felt fairly well but for the past 6 to 8 weeks she has noticed more shortness of breath than she had experienced previously. Approximately 3 weeks ago, she had an episode of chest tightness which resolved. She denies any significant chest pain since that time and denies any nocturnal symptoms. She has experienced shortness of breath with activity and has taken a terbutaline tablet which she had had and noted improvement. She denies any PND orthopnea. She denies any nocturnal chest tightness. During her evaluation, her ECG showed sinus rhythm at 70 bpm with an isolated PVC and new inferior and anterolateral T wave inversion of 3 to 5 mm in leads II 3 and F, V4 through V6. During that evaluation I discussed with her my concern for her change in ECG with new T wave inversion. She told me she had not had an  episode of chest pain for over 3 weeks. However she was noticing shortness of breath with less activity. I discussed options including admission to the hospital on that day versus adding additional medication to her regimen. At the time she preferred that I do her cardiac catheterization. I added isosorbide 30 mg daily to her medical regimen as well as metoprolol tartrate 12.5 mg twice daily I recommended reinstitution of aspirin which she had discontinued. She continues to be on atorvastatin 80 mg daily, olmesartan 30 mg for blood pressure. She denied any nocturnal symptoms. I recommended that she return to the office today April 17, 2019 for reassessment of the effects of her medical therapy and repeat ECG. Over the weekend, she actually has felt significantly better and denies any chest tightness or shortness of breath. However, her EKG in the office today now shows sinus bradycardia at 51 bpm with more pronounced T wave abnormality of approximately 10 mm in V3 through V6 in addition to 4 mm inferiorly. As result, admission to the hospital was recommended and she is now admitted with plans to institute heparinization and as long as she is stable plan cardiac catheterization tomorrow or later today if  symptoms change.      Past Medical History:  Diagnosis Date  . Anxiety   . Arthritis   . Asthma   . CAD (coronary artery disease)    single vessel  . GERD (gastroesophageal reflux disease)   . Headache   . History of stress test 10/2011   Resting images reveal a normal pattern of perfusion in all regions. The post stress myocardial perfusion images show a normal pattern of perfusion in all region. The post stress left ventricle is normal size. the rest left ventricle is normal size, there is no scintigraphic evidence of inductible myocardial ischemia.  Marland Kitchen Hx of echocardiogram 09/2009   EF >55% Normal Echo  . Hyperlipidemia   . Hypertension   . Migraines   . Pneumonia    hx of  . Pulmonary emboli  ([facility]) 5/12  . Shortness of breath dyspnea    exertion or eating something shes allergic too  . Tobacco abuse         Past Surgical History:  Procedure Laterality Date  . ADENOIDECTOMY    . BACK SURGERY  2012  . CARDIAC CATHETERIZATION  10/13/2007   60% narrowing twin LAD system  . FOOT SURGERY  2002,2013   right  . KNEE SURGERY  01/17/2007   right  . SACROILIAC JOINT FUSION Right 03/28/2014   Procedure: SACROILIAC JOINT FUSION; Surgeon: Sinclair Ship, MD; Location: [facility]; Service: Orthopedics; Laterality: Right; Right sided sacroliac joint fusion  . TONSILLECTOMY          Allergies  Allergen Reactions  . Cefzil [Cefprozil] Shortness Of Breath  . Keflex [Cephalexin] Shortness Of Breath and Rash  . Lactose Intolerance (Gi) Shortness Of Breath    ALL DAIRY PRODUCTS  . Levaquin [Levofloxacin] Shortness Of Breath and Rash  . Naproxen Shortness Of Breath    Shortness of breath  Shortness of breath  . Oxycodone-Acetaminophen Nausea And Vomiting and Shortness Of Breath  . Peanuts [Peanut Oil] Shortness Of Breath  . Penicillins Shortness Of Breath  . Percocet [Oxycodone-Acetaminophen] Shortness Of Breath  . Red Dye Shortness Of Breath  . Sulfa Antibiotics Shortness Of Breath  . Sulfacetamide Sodium Shortness Of Breath  . Tetracycline Hcl Shortness Of Breath  . Tetracyclines & Related Shortness Of Breath  . Methocarbamol Nausea And Vomiting  . Prednisone Other (See Comments)    Rash  . Tomato Other (See Comments)    SOB  . Amoxicillin-Pot Clavulanate Rash  . Ibuprofen Rash  . Moxifloxacin Rash  . Penicillin G Rash  . Sulfamethoxazole-Trimethoprim Rash  . Tetracycline Rash  . Tramadol Rash         Current Outpatient Medications  Medication Sig Dispense Refill  . ALPRAZolam (XANAX) 1 MG tablet TAKE 1 TABLET UP TO THREE TIMES PER DAY FOR ANXIETY    . aspirin EC 81 MG tablet Take 81 mg by mouth daily.    Marland Kitchen atorvastatin (LIPITOR) 80 MG tablet Take 1 tablet (80 mg  total) by mouth daily. 90 tablet 3  . budesonide (PULMICORT) 0.5 MG/2ML nebulizer solution USE AS DIRECTED FOR NASAL RINSE 120 mL 0  . butalbital-acetaminophen-caffeine (FIORICET) 50-325-40 MG tablet Take 1 tablet by mouth every 6 (six) hours as needed.    . Cholecalciferol (VITAMIN D3) 2000 UNITS TABS Take 2,000 Units by mouth daily.     Marland Kitchen diltiazem (CARDIZEM CD) 300 MG 24 hr capsule Take 1 capsule (300 mg total) by mouth at bedtime. 90 capsule 3  . escitalopram (LEXAPRO) 20 MG  tablet Take 1 tablet (20 mg total) by mouth daily. 90 tablet 0  . ezetimibe (ZETIA) 10 MG tablet Take 1 tablet (10 mg total) by mouth daily. 90 tablet 3  . Fluticasone-Salmeterol (ADVAIR DISKUS) 250-50 MCG/DOSE AEPB Inhale 1 puff into the lungs 2 (two) times daily.    . isosorbide mononitrate (IMDUR) 30 MG 24 hr tablet Take 1 tablet (30 mg total) by mouth daily. 30 tablet 3  . metoprolol tartrate (LOPRESSOR) 25 MG tablet Take 0.5 tablets (12.5 mg total) by mouth 2 (two) times daily. 90 tablet 3  . Multiple Vitamin (MULTI-VITAMIN DAILY PO) Take 1 tablet by mouth daily.    . nitroGLYCERIN (NITROSTAT) 0.4 MG SL tablet Place 1 tablet (0.4 mg total) under the tongue every 5 (five) minutes as needed for chest pain. Needs appointment for future refills 25 tablet 0  . olmesartan (BENICAR) 20 MG tablet Take 1.5 tablets (30 mg total) by mouth daily. Take 30-12m as needed for BP 90 tablet 3  . ondansetron (ZOFRAN) 4 MG tablet Take 4 mg by mouth every 8 (eight) hours as needed for nausea.    . pantoprazole (PROTONIX) 40 MG tablet Take 1 tablet by mouth 2 (two) times daily.    .Marland KitchenPhenylephrine HCl ([facility] SINUS PE DECONGESTANT PO) Take by mouth.    . predniSONE (DELTASONE) 10 MG tablet Take 3 tablets PO for 3 days, then take 2 tablets PO for 3 days, then take 1 tablet PO for 3 days.    . terbutaline (BRETHINE) 5 MG tablet Take 5 mg by mouth every 6 (six) hours as needed (asthma).     . VENTOLIN HFA 108 (90 Base) MCG/ACT inhaler INHALE 2  PUFFS INTO THE LUNGS EVERY 6 (SIX) HOURS AS NEEDED FOR WHEEZING.  11   No current facility-administered medications for this visit.   Social History        Socioeconomic History  . Marital status: Widowed    Spouse name: Not on file  . Number of children: Not on file  . Years of education: Not on file  . Highest education level: Not on file  Occupational History  . Not on file  Tobacco Use  . Smoking status: Current Some Day Smoker    Packs/day: 0.10    Types: Cigarettes    Start date: 01/30/2014  . Smokeless tobacco: Former [employer] developer   Quit date: 01/29/2014  Substance and Sexual Activity  . Alcohol use: Yes    Alcohol/week: 4.0 standard drinks    Types: 2 Glasses of wine, 2 Cans of beer per week  . Drug use: No  . Sexual activity: Not on file  Other Topics Concern  . Not on file  Social History Narrative  . Not on file   Social Determinants of Health      Financial Resource Strain:   . Difficulty of Paying Living Expenses: Not on file  Food Insecurity:   . Worried About RCharity fundraiserin the Last Year: Not on file  . Ran Out of Food in the Last Year: Not on file  Transportation Needs:   . Lack of Transportation (Medical): Not on file  . Lack of Transportation (Non-Medical): Not on file  Physical Activity:   . Days of Exercise per Week: Not on file  . Minutes of Exercise per Session: Not on file  Stress:   . Feeling of Stress : Not on file  Social Connections:   . Frequency of Communication with Friends and Family:  Not on file  . Frequency of Social Gatherings with Friends and Family: Not on file  . Attends Religious Services: Not on file  . Active Member of Clubs or Organizations: Not on file  . Attends Archivist Meetings: Not on file  . Marital Status: Not on file  Intimate Partner Violence:   . Fear of Current or Ex-Partner: Not on file  . Emotionally Abused: Not on file  . Physically Abused: Not on file  . Sexually Abused: Not on file          Family History  Problem Relation Age of Onset  . Heart attack Mother   . Diabetes Mother   . Asthma Mother   . Heart attack Father   . Diabetes Sister   . Hyperlipidemia Sister   . Allergic rhinitis Neg Hx   . Angioedema Neg Hx   . Eczema Neg Hx   . Immunodeficiency Neg Hx   . Urticaria Neg Hx    Socially, she is widowed. She has 2 children 5 grandchildren. She does not exercise. She quit smoking 2 years ago. resumed on 08/31/22 after her dog died. She is committed to completely discontinue tobacco.  ROS  General: Negative; No fevers, chills, or night sweats;  HEENT: Negative; No changes in vision or hearing, sinus congestion, difficulty swallowing  Pulmonary: Positive for asthma  Cardiovascular: Negative; No chest pain, presyncope, syncope, palpitations  GI: positive for GERD; No nausea, vomiting, diarrhea, or abdominal pain  GU: Negative; No dysuria, hematuria, or difficulty voiding  Musculoskeletal: She tolerated her hip surgery well.  Hematologic/Oncology: Negative; no easy bruising, bleeding  Endocrine: Negative; no heat/cold intolerance; no diabetes  Neuro: Negative; no changes in balance, headaches  Skin: Negative; No rashes or skin lesions  Psychiatric: Negative; No behavioral problems, depression  Sleep: Negative; No snoring, daytime sleepiness, hypersomnolence, bruxism, restless legs, hypnogognic hallucinations, no cataplexy  Other comprehensive 14 point system review is negative.  Nutritionally she cannot have dairy products due to significant milk allergy.  PE  BP (!) 157/67  Pulse (!) 56  Temp (!) 97.2 F (36.2 C)  Ht '5\' 4"'  (1.626 m)  Wt 134 lb (60.8 kg)  LMP (LMP Unknown)  SpO2 98%  BMI 23.00 kg/m  Repeat blood pressure by me in the office was 128/70     Wt Readings from Last 3 Encounters:  04/17/19 134 lb (60.8 kg)  04/14/19 133 lb (60.3 kg)  12/14/18 134 lb (60.8 kg)   General: Alert, oriented, no distress.  Skin: normal turgor, no rashes, warm and  dry  HEENT: Normocephalic, atraumatic. Pupils equal round and reactive to light; sclera anicteric; extraocular muscles intact;  Nose without nasal septal hypertrophy  Mouth/Parynx benign; Mallinpatti scale 3  Neck: No JVD, no carotid bruits; normal carotid upstroke  Lungs: clear to ausculatation and percussion; no wheezing or rales  Chest wall: without tenderness to palpitation  Heart: PMI not displaced, RRR, s1 s2 normal, 1/6 systolic murmur, no diastolic murmur, no rubs, gallops, thrills, or heaves  Abdomen: soft, nontender; no hepatosplenomehaly, BS+; abdominal aorta nontender and not dilated by palpation.  Back: no CVA tenderness  Pulses 2+  Musculoskeletal: full range of motion, normal strength, no joint deformities  Extremities: no clubbing cyanosis or edema, Homan's sign negative  Neurologic: grossly nonfocal; Cranial nerves grossly wnl  Psychologic: Normal mood and affect  ECG (independently read by me): Sinus bradycardia 51 bpm. LVH by voltage criteria. More deeply inverted inferolateral T wave inversion 8 to  10 mm in the anterolateral leads in 2 to 4 mm inferiorly  April 14, 2019 ECG (independently read by me): Normal sinus rhythm at 70 bpm. New inferior and anterolateral T wave inversion. PR interval 170 ms, QTc interval 444 ms; an isolated PVC  September 2020 ECG (independently read by me): Sinus bradycardia at 58 bpm. Nonspecific ST changes. Poor R wave progression V1 through V3  January 2020 ECG (independently read by me): Normal sinus rhythm at 61 bpm. No ectopy. Specific ST change in lead III.  February 2019 ECG (independently read by me): Normal sinus rhythm at 72 bpm. Nonspecific ST changes inferiorly. QTc interval 451 ms.  June 2018 ECG (independently read by me): Sinus bradycardia 57 bpm. No significant ST-T changes. Normal intervals.  January 2018 ECG (independently read by me): Normal sinus rhythm at 69 bpm. No ectopy. Normal intervals.  November 2016 ECG (independently  read by me): Normal sinus rhythm at 60 bpm. No significant ST-T changes. Normal intervals.  November 2015 ECG (independently read by me): Normal sinus rhythm at 72 bpm. Normal intervals. No significant ST-T changes.  October 2014 ECG: Sinus rhythm with occasional isolated PVC. Heart rate 68 beats per minute. QTc interval 440 ms.  LAB:  BMP Latest Ref Rng & Units 12/09/2018 11/03/2017 02/08/2017  Glucose 65 - 99 mg/dL 93 95 99  BUN 8 - 27 mg/dL '16 14 14  ' Creatinine 0.57 - 1.00 mg/dL 0.61 0.54(L) 0.54(L)  BUN/Creat Ratio 12 - '28 26 26 26  ' Sodium 134 - 144 mmol/L 138 142 144  Potassium 3.5 - 5.2 mmol/L 4.7 4.9 4.7  Chloride 96 - 106 mmol/L 99 103 104  CO2 20 - 29 mmol/L '27 25 25  ' Calcium 8.7 - 10.3 mg/dL 9.6 9.3 9.6   Hepatic Function Latest Ref Rng & Units 12/09/2018 11/03/2017 02/08/2017  Total Protein 6.0 - 8.5 g/dL 6.7 6.1 6.6  Albumin 3.7 - 4.7 g/dL 4.9(H) 4.5 4.7  AST 0 - 40 IU/L '21 16 19  ' ALT 0 - 32 IU/L '26 22 18  ' Alk Phosphatase 39 - 117 IU/L 63 67 78  Total Bilirubin 0.0 - 1.2 mg/dL 0.3 0.2 0.3   CBC Latest Ref Rng & Units 11/03/2017 02/08/2017 04/27/2016  WBC 3.4 - 10.8 x10E3/uL 4.5 6.7 6.6  Hemoglobin 11.1 - 15.9 g/dL 12.0 13.4 11.7  Hematocrit 34.0 - 46.6 % 36.5 39.5 36.2  Platelets 150 - 450 x10E3/uL 197 185 196   Recent Labs                                   Recent Labs                       Recent Labs    Lipid Panel  Labs (Brief)                                                          RADIOLOGY:  No results found.  IMPRESSION:  1. Coronary artery disease involving native coronary artery of native heart with other form of angina pectoris ([place])   2. DOE (dyspnea on exertion)   3. Essential hypertension   4. Hyperlipidemia LDL goal <70   5. Mild asthma without complication, unspecified  whether persistent    ASSESSMENT AND PLAN:  Ms. Spires is a 79 year-old white female who had smoked for over 50 years and quit smoking 2 years ago. Unfortunately,  she was under significant stress with the death of her dog and resumed smoking in May 2018 continued to smoke intermittently but fortunately again quit smoking in March 2020. She has CAD with a 60% ostial LAD stenosis dating back to 2001 and which again was noted on her repeat catheterization by me in 2009. We have been aggressively treating her lipids in attempt to induce plaque regression and she has tolerated Lipitor 80 mg in addition to Zetia 10 mg. Most recent laboratory on December 09, 2018 showed a total cholesterol 159, triglycerides 46, HDL 81, and LDL cholesterol at 68. A previous coronary CT angiography in December 2018 did not demonstrate significant progression of disease and showed 25-50% stenoses in the LAD and RCA; FFR was negative. I had seen her in early 2020 and had instituted olmesartan due to continued blood pressure elevation. She had experienced increased stress at the time of her last evaluation in 01/06/2019 due to her sister's death. At that time her blood pressure was elevated and I further titrated olmesartan. Over the past 6 to 8 weeks she has noticed more episodes of shortness of breath typically with activity. She experienced an episode of chest pain approximately 3 weeks ago but denies any recurrent significant chest pain since that time. However times there is a vague chest pressure sensation which resolves spontaneously. She denies any rest symptoms. When I saw her on April 14, 2019 her ECG showed new inferolateral T wave inversion worrisome for ischemia. During that evaluation I discussed with her potential admission to the hospital but she preferred not to go to the hospital if at all possible with plans for elective catheterization. I added isosorbide mononitrate 30 mg in addition to metoprolol tartrate 12.5 mg to her medical regimen. Over the last several days she has felt significantly improved since taking this medication. Her resting pulse now is 51 however ECG in the  office today shows more deeply inverted T wave inversion. She was here in the office with her daughter today. At present she feels well and denies chest pain or shortness of breath with the medication adjustments. However, with her progressive ECG changes I have strongly recommended admission to the hospital today. She will be a direct admit with plans for initiation of heparinization upon arrival. She will ultimately need a cardiac catheterization potentially to be done tomorrow however if she develops symptomatology today this should be done later today. I  Signed  Troy Sine, MD, [facility]  04/17/2019  2:16 PM     Signed: Phill Myron. West Pugh, ANP, AACC  04/17/2019, 2:46 PM Co-Sign MD

## 2019-04-18 ENCOUNTER — Inpatient Hospital Stay (HOSPITAL_COMMUNITY): Payer: Medicare Other

## 2019-04-18 ENCOUNTER — Encounter (HOSPITAL_COMMUNITY)
Admission: AD | Disposition: A | Discharge status: Home / Self Care | Payer: Self-pay | Source: Home / Self Care | Attending: Cardiovascular Disease

## 2019-04-18 ENCOUNTER — Other Ambulatory Visit: Payer: Self-pay

## 2019-04-18 DIAGNOSIS — Z720 Tobacco use: Secondary | ICD-10-CM

## 2019-04-18 DIAGNOSIS — I2510 Atherosclerotic heart disease of native coronary artery without angina pectoris: Secondary | ICD-10-CM

## 2019-04-18 DIAGNOSIS — R9431 Abnormal electrocardiogram [ECG] [EKG]: Secondary | ICD-10-CM

## 2019-04-18 HISTORY — PX: INTRAVASCULAR PRESSURE WIRE/FFR STUDY: CATH118243

## 2019-04-18 HISTORY — PX: LEFT HEART CATH AND CORONARY ANGIOGRAPHY: CATH118249

## 2019-04-18 LAB — ECHOCARDIOGRAM COMPLETE
Height: 64 in
Weight: 2081.6 oz

## 2019-04-18 LAB — BASIC METABOLIC PANEL
Anion gap: 9 (ref 5–15)
BUN: 15 mg/dL (ref 8–23)
CO2: 25 mmol/L (ref 22–32)
Calcium: 8.6 mg/dL — ABNORMAL LOW (ref 8.9–10.3)
Chloride: 106 mmol/L (ref 98–111)
Creatinine, Ser: 0.58 mg/dL (ref 0.44–1.00)
GFR calc Af Amer: >60 mL/min (ref >60)
GFR calc non Af Amer: >60 mL/min (ref >60)
Glucose, Bld: 101 mg/dL — ABNORMAL HIGH (ref 70–99)
Potassium: 3.8 mmol/L (ref 3.5–5.1)
Sodium: 140 mmol/L (ref 135–145)

## 2019-04-18 LAB — POCT ACTIVATED CLOTTING TIME
Activated Clotting Time: 230 seconds
Activated Clotting Time: 301 seconds

## 2019-04-18 LAB — CBC
HCT: 33.4 % — ABNORMAL LOW (ref 36.0–46.0)
Hemoglobin: 11.3 g/dL — ABNORMAL LOW (ref 12.0–15.0)
MCH: 31.2 pg (ref 26.0–34.0)
MCHC: 33.8 g/dL (ref 30.0–36.0)
MCV: 92.3 fL (ref 80.0–100.0)
Platelets: 123 10*3/uL — ABNORMAL LOW (ref 150–400)
RBC: 3.62 MIL/uL — ABNORMAL LOW (ref 3.87–5.11)
RDW: 12.8 % (ref 11.5–15.5)
WBC: 4.6 10*3/uL (ref 4.0–10.5)
nRBC: 0 % (ref 0.0–0.2)

## 2019-04-18 LAB — HEPARIN LEVEL (UNFRACTIONATED)
Heparin Unfractionated: 0.46 IU/mL (ref 0.30–0.70)
Heparin Unfractionated: 0.52 IU/mL (ref 0.30–0.70)

## 2019-04-18 SURGERY — LEFT HEART CATH AND CORONARY ANGIOGRAPHY
Anesthesia: LOCAL

## 2019-04-18 MED ORDER — IOHEXOL 350 MG/ML SOLN
INTRAVENOUS | Status: DC | PRN
  Administered 2019-04-18: 100 mL

## 2019-04-18 MED ORDER — MIDAZOLAM HCL 2 MG/2ML IJ SOLN
INTRAMUSCULAR | Status: DC | PRN
  Administered 2019-04-18: 1 mg via INTRAVENOUS

## 2019-04-18 MED ORDER — LORATADINE 10 MG PO TABS: 10.0000 mg | ORAL_TABLET | Freq: Every day | ORAL | Status: DC

## 2019-04-18 MED ORDER — SODIUM CHLORIDE 0.9 % IV SOLN: INTRAVENOUS | Status: AC

## 2019-04-18 MED ORDER — ACETAMINOPHEN 325 MG PO TABS
650.0000 mg | ORAL_TABLET | Freq: Four times a day (QID) | ORAL | Status: DC | PRN
  Administered 2019-04-18: 650 mg via ORAL

## 2019-04-18 MED ORDER — VERAPAMIL HCL 2.5 MG/ML IV SOLN
INTRAVENOUS | Status: DC | PRN
  Administered 2019-04-18: 09:00:00 10 mL via INTRA_ARTERIAL

## 2019-04-18 MED ORDER — VERAPAMIL HCL 2.5 MG/ML IV SOLN
INTRAVENOUS | Status: AC
  Filled 2019-04-18: qty 2

## 2019-04-18 MED ORDER — ADENOSINE 12 MG/4ML IV SOLN
INTRAVENOUS | Status: AC
  Filled 2019-04-18: qty 16

## 2019-04-18 MED ORDER — SODIUM CHLORIDE 0.9 % IV SOLN: 250.0000 mL | INTRAVENOUS | Status: DC | PRN

## 2019-04-18 MED ORDER — HEPARIN (PORCINE) IN NACL 1000-0.9 UT/500ML-% IV SOLN
INTRAVENOUS | Status: AC
  Filled 2019-04-18: qty 1000

## 2019-04-18 MED ORDER — METOPROLOL TARTRATE 25 MG PO TABS: 25.0000 mg | ORAL_TABLET | Freq: Two times a day (BID) | ORAL | 3 refills | Status: DC

## 2019-04-18 MED ORDER — SODIUM CHLORIDE 0.9% FLUSH: 3.0000 mL | Freq: Two times a day (BID) | INTRAVENOUS | Status: DC

## 2019-04-18 MED ORDER — HEPARIN SODIUM (PORCINE) 1000 UNIT/ML IJ SOLN
INTRAMUSCULAR | Status: DC | PRN
  Administered 2019-04-18: 4000 [IU] via INTRAVENOUS
  Administered 2019-04-18 (×2): 3000 [IU] via INTRAVENOUS

## 2019-04-18 MED ORDER — METOPROLOL TARTRATE 25 MG PO TABS: 25.0000 mg | ORAL_TABLET | Freq: Two times a day (BID) | ORAL | Status: DC

## 2019-04-18 MED ORDER — FLUTICASONE PROPIONATE 50 MCG/ACT NA SUSP
1.0000 | Freq: Every day | NASAL | Status: DC
  Filled 2019-04-18: qty 16

## 2019-04-18 MED ORDER — SODIUM CHLORIDE 0.9% FLUSH: 3.0000 mL | INTRAVENOUS | Status: DC | PRN

## 2019-04-18 MED ORDER — FENTANYL CITRATE (PF) 100 MCG/2ML IJ SOLN
INTRAMUSCULAR | Status: DC | PRN
  Administered 2019-04-18: 25 ug via INTRAVENOUS

## 2019-04-18 MED ORDER — LIDOCAINE HCL (PF) 1 % IJ SOLN
INTRAMUSCULAR | Status: AC
  Filled 2019-04-18: qty 30

## 2019-04-18 MED ORDER — HEPARIN SODIUM (PORCINE) 1000 UNIT/ML IJ SOLN
INTRAMUSCULAR | Status: AC
  Filled 2019-04-18: qty 1

## 2019-04-18 MED ORDER — FENTANYL CITRATE (PF) 100 MCG/2ML IJ SOLN
INTRAMUSCULAR | Status: AC
  Filled 2019-04-18: qty 2

## 2019-04-18 MED ORDER — MIDAZOLAM HCL 2 MG/2ML IJ SOLN
INTRAMUSCULAR | Status: AC
  Filled 2019-04-18: qty 2

## 2019-04-18 MED ORDER — ADENOSINE (DIAGNOSTIC) 140MCG/KG/MIN
INTRAVENOUS | Status: DC | PRN
  Administered 2019-04-18: 140 ug/kg/min via INTRAVENOUS

## 2019-04-18 MED ORDER — LIDOCAINE HCL (PF) 1 % IJ SOLN
INTRAMUSCULAR | Status: DC | PRN
  Administered 2019-04-18: 2 mL

## 2019-04-18 MED ORDER — HEPARIN (PORCINE) IN NACL 1000-0.9 UT/500ML-% IV SOLN
INTRAVENOUS | Status: DC | PRN
  Administered 2019-04-18 (×2): 500 mL

## 2019-04-18 SURGICAL SUPPLY — 16 items
CATH LAUNCHER 5F EBU3.5 (CATHETERS) ×1 IMPLANT
CATH MICROCATH NAVVUS (MICROCATHETER) IMPLANT
CATH OPTITORQUE TIG 4.0 5F (CATHETERS) ×1 IMPLANT
DEVICE RAD COMP TR BAND LRG (VASCULAR PRODUCTS) ×1 IMPLANT
GLIDESHEATH SLEND A-KIT 6F 22G (SHEATH) ×1 IMPLANT
GUIDEWIRE INQWIRE 1.5J.035X260 (WIRE) IMPLANT
GUIDEWIRE PRESSURE COMET II (WIRE) ×1 IMPLANT
INQWIRE 1.5J .035X260CM (WIRE) ×2
KIT ESSENTIALS PG (KITS) ×1 IMPLANT
KIT HEART LEFT (KITS) ×2 IMPLANT
MICROCATHETER NAVVUS (MICROCATHETER) ×2
PACK CARDIAC CATHETERIZATION (CUSTOM PROCEDURE TRAY) ×2 IMPLANT
SHEATH PROBE COVER 6X72 (BAG) ×1 IMPLANT
TRANSDUCER W/STOPCOCK (MISCELLANEOUS) ×2 IMPLANT
TUBING CIL FLEX 10 FLL-RA (TUBING) ×2 IMPLANT
WIRE ASAHI PROWATER 180CM (WIRE) ×1 IMPLANT

## 2019-04-18 NOTE — Progress Notes (Signed)
ANTICOAGULATION CONSULT NOTE   Pharmacy Consult for Heparin Indication: chest pain/ACS  Allergies  Allergen Reactions  . Cefzil [Cefprozil] Shortness Of Breath  . Keflex [Cephalexin] Shortness Of Breath and Rash  . Lactose Intolerance (Gi) Shortness Of Breath    ALL DAIRY PRODUCTS  . Levaquin [Levofloxacin] Shortness Of Breath and Rash  . Naproxen Shortness Of Breath    Shortness of breath Shortness of breath  . Oxycodone-Acetaminophen Nausea And Vomiting and Shortness Of Breath  . Peanuts [Peanut Oil] Shortness Of Breath  . Penicillins Shortness Of Breath  . Percocet [Oxycodone-Acetaminophen] Shortness Of Breath  . Red Dye Shortness Of Breath  . Sulfa Antibiotics Shortness Of Breath  . Sulfacetamide Sodium Shortness Of Breath  . Tetracycline Hcl Shortness Of Breath  . Tetracyclines & Related Shortness Of Breath  . Methocarbamol Nausea And Vomiting  . Prednisone Other (See Comments)    Rash  . Tomato Other (See Comments)    SOB  . Amoxicillin-Pot Clavulanate Rash  . Ibuprofen Rash  . Moxifloxacin Rash  . Penicillin G Rash  . Sulfamethoxazole-Trimethoprim Rash  . Tetracycline Rash  . Tramadol Rash    Patient Measurements: Height: 5\' 4"  (162.6 cm) Weight: 131 lb 8 oz (59.6 kg) IBW/kg (Calculated) : 54.7 Heparin Dosing Weight: 59kg  Vital Signs: Temp: 97.6 F (36.4 C) (01/18 2010) Temp Source: Oral (01/18 2010) BP: 156/59 (01/18 2010) Pulse Rate: 57 (01/18 2010)  Labs: Recent Labs    04/17/19 1617 04/17/19 1753 04/18/19 0018  HGB 11.9*  --  11.3*  HCT 35.8*  --  33.4*  PLT 132*  --  PENDING  HEPARINUNFRC  --   --  0.52  CREATININE 0.62  --  0.58  TROPONINIHS 10 11  --     Estimated Creatinine Clearance: 51.7 mL/min (by C-G formula based on SCr of 0.58 mg/dL).   Medical History: Past Medical History:  Diagnosis Date  . Anxiety   . Aortic valve stenosis   . Arthritis   . Asthma   . CAD (coronary artery disease)    single vessel  . COPD (chronic  obstructive pulmonary disease) (HCC)   . GERD (gastroesophageal reflux disease)   . Headache   . History of stress test 10/2011   Resting images reveal a normal pattern of perfusion in all regions. The post stress myocardial perfusion images show a normal pattern of perfusion in all region. The post stress left ventricle is normal size. the rest left ventricle is normal size, there is no scintigraphic evidence of inductible myocardial ischemia.  11/2011 Hx of echocardiogram 09/2009   EF >55% Normal Echo  . Hyperlipidemia   . Hypertension   . Migraines   . Pneumonia    hx of  . Pulmonary emboli (HCC) 5/12  . Shortness of breath dyspnea    exertion or eating something shes allergic too  . Tobacco abuse      Assessment: 40 yoF with known CAD admitted directly from cardiology office with concerns for ACS. No AC noted PTA, cath planned for tomorrow.  1/19 AM update:  Initial heparin level is therapeutic   Goal of Therapy:  Heparin level 0.3-0.7 units/ml Monitor platelets by anticoagulation protocol: Yes   Plan:  -Cont heparin at 700 units/hr -Confirmatory heparin level with AM labs -Daily heparin level and CBC  2/19, PharmD, BCPS Clinical Pharmacist Phone: [phone]

## 2019-04-18 NOTE — Discharge Summary (Addendum)
The patient has been seen in conjunction with Laverda Page, NP. All aspects of care have been considered and discussed. The patient has been personally interviewed, examined, and all clinical data has been reviewed.   Cardiac cath.  LAD lesion continues critical in some views.  FFR demonstrated nonsignificant physiology.  Plan continued medical therapy as outlined below.  Patient was transferred from the Cath Lab to outpatient quality and is appropriate for discharge at this time.  Follow-up with Dr. Tresa Endo.   Discharge Summary    Patient ID: Judith Stevens,  MRN: [id number], DOB/AGE: 06/07/1940 79 y.o.  Admit date: 04/17/2019 Discharge date: 04/18/2019  Primary Care Provider: Richmond Stevens Primary Cardiologist: Judith Guadalajara, MD  Discharge Diagnoses    Principal Problem:   Unstable angina [facility]) Active Problems:   Hypertension   Hyperlipidemia   Hyperlipidemia with target LDL less than 70   Allergies Allergies  Allergen Reactions   Cefzil [Cefprozil] Shortness Of Breath   Keflex [Cephalexin] Shortness Of Breath and Rash   Lactose Intolerance (Gi) Shortness Of Breath    ALL DAIRY PRODUCTS   Levaquin [Levofloxacin] Shortness Of Breath and Rash   Naproxen Shortness Of Breath    Shortness of breath Shortness of breath   Oxycodone-Acetaminophen Nausea And Vomiting and Shortness Of Breath   Peanuts [Peanut Oil] Shortness Of Breath   Penicillins Shortness Of Breath   Percocet [Oxycodone-Acetaminophen] Shortness Of Breath   Red Dye Shortness Of Breath   Sulfa Antibiotics Shortness Of Breath   Sulfacetamide Sodium Shortness Of Breath   Tetracycline Hcl Shortness Of Breath   Tetracyclines & Related Shortness Of Breath   Methocarbamol Nausea And Vomiting   Prednisone Other (See Comments)    Rash   Tomato Other (See Comments)    SOB   Amoxicillin-Pot Clavulanate Rash   Ibuprofen Rash   Moxifloxacin Rash   Penicillin G Rash    Sulfamethoxazole-Trimethoprim Rash   Tetracycline Rash   Tramadol Rash    Diagnostic Studies/Procedures    Cath: 04/18/19   There is hyperdynamic left ventricular systolic function. The left ventricular ejection fraction is greater than 65% by visual estimate.  LV end diastolic pressure is normal.  Mid LAD lesion is 55% stenosed.-NOT SIGNIFICANT, FFR 0.85  Widely patent anomalous circumflex.  Widely patent dominant RCA   SUMMARY  Moderate LAD-2nd Diag bifurcation lesion in the following portion of the LAD-FFR negative.  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  Borderline LVEDP despite systemic hypertension.   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  Considered Ranexa, however has a flag for red dye allergy (would need to discuss with pharmacy)   Bryan Lemma, M.D., M.S. Interventional Cardiologist    Diagnostic Dominance: Right  TTE: 04/18/19  IMPRESSIONS    1. Left ventricular ejection fraction, by visual estimation, is 65 to 70%. The left ventricle has hyperdynamic function. There is no left ventricular hypertrophy.  2.  Left ventricular diastolic parameters are indeterminate.  3. Mildly dilated left ventricular internal cavity size.  4. The left ventricle has no regional wall motion abnormalities.  5. Global right ventricle has normal systolic  function.The right ventricular size is normal. No increase in right ventricular wall thickness.  6. Left atrial size was mildly dilated.  7. Right atrial size was normal.  8. Moderate mitral annular calcification.  9. The mitral valve is normal in structure. Trivial mitral valve regurgitation. 10. The tricuspid valve is normal in structure. 11. The tricuspid valve is normal in structure. Tricuspid valve regurgitation is trivial. 12. The aortic valve has an indeterminant number of cusps. Aortic valve regurgitation is not visualized. 13. The pulmonic valve was grossly normal. Pulmonic valve regurgitation is not visualized. 14. The ascending aorta was not well visualized. 15. The inferior vena cava is normal in size with greater than 50% respiratory variability, suggesting right atrial pressure of 3 mmHg.  _____________   History of Present Illness     Judith Stevens is a 79 y.o. female with PMH of CAD 60% lesion in ostial LAD dating back to 2001, HTN, HL and asthma who presented as a direct admit for worsening dyspnea on exertion. [name] has CAD and in 2001 underwent cardiac catheterization while in [place] which showed a 60% ostial narrowing of her LAD. Repeat catheterization by Dr. Claiborne Billings in 2009 did not show any significant interval change. She has a history of hypertension as well as hyperlipidemia. A nuclear perfusion study in August 2013 which was unchanged from 2 years previously and continued to show normal perfusion.   In December 2015 prior to undergoing right hip surgery a 2 year follow-up nuclear perfusion study on 03/13/2014 was performed preoperatively. This continued to show normal perfusion without scar or ischemia. She tolerated her hip surgery well. An echo Doppler study on 02/18/2015 showed an EF of 55-60%, moderate thickening and calcification of a trileaflet aortic valve, consistent with sclerosis but without stenosis and mitral annular calcification. Laboratory  revealed excellent lipid studies with a total cholesterol 149, triglycerides 55, HDL 85 and her LDL was now 53. Thyroid function studies were normal. Her renal function was excellent. LFTs were normal. Vitamin D was normal at 41 and her vitamin B-12 level was normal at 448.   She was seen by Dr. Claiborne Billings in November 2018 she had been taking caring for her sister who had undergone aortic stent grafting. She was seen by pulmonary underwent a coronary CT, which showed a calcium score of 81 which was 69 percentile for age and sex matched control. 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 There is right dominance. There was diffuse calcified plaque in the RCA and proximal LAD with narrowing of 25-50%. 7. She underwent FFR analysis with both lesions being in the normal FFR range of greater than 0.80. She has significantly reduced her tobacco and now smokes very intermittently.    She was seen in the office on April 14, 2019. Over the last several months she had felt fairly well but for the past 6 to 8 weeks she has noticed more shortness of breath than she had experienced previously. Approximately 3 weeks ago, she had an episode of chest tightness which resolved. She denies any significant chest pain since that time and denies any nocturnal symptoms. She has experienced shortness of breath with activity and has taken a terbutaline tablet which she had  had and noted improvement. She denies any PND orthopnea. She denies any nocturnal chest tightness. During her evaluation, her ECG showed sinus rhythm at 70 bpm with an isolated PVC and new inferior and anterolateral T wave inversion of 3 to 5 mm in leads II 3 and F, V4 through V6. It was recommended that she be admitted to the hospital for possible cardiac cath. She presented on 04/17/19 as a direct admission.  Hospital Course     hsTn remained negative. No chest pain on admission.  Underwent cardiac cath noted above with 55% lesion in the LAD but negative FFR. Follow up echo showed normal EF with no rWMA. Her blood pressures were noted to be elevated this admission, therefore her metoprolol was titrated to 25mg  BID with continuation of her other home medications. Findings were discussed at length with the patient via Dr. . Radial site remained stable post cath. Instructions/precautions regarding site care were given prior to discharge. She was sent to short stay post cath and discharged from there to home.   KAYONA FOOR was seen by Dr. Colan Neptune and determined stable for discharge home. Follow up in the office has been arranged. Medications are listed below.   _____________  Discharge Vitals Blood pressure 130/80, pulse 74, temperature 98 F (36.7 C), temperature source Oral, resp. rate 15, height 5\' 4"  (1.626 m), weight 59 kg, SpO2 95 %.  Filed Weights   04/17/19 1502 04/18/19 0400  Weight: 59.6 kg 59 kg    Labs & Radiologic Studies    CBC Recent Labs    04/17/19 1617 04/18/19 0018  WBC 4.8 4.6  NEUTROABS 2.6  --   HGB 11.9* 11.3*  HCT 35.8* 33.4*  MCV 93.7 92.3  PLT 132* 123*   Basic Metabolic Panel Recent Labs    04/19/19 1617 04/18/19 0018  NA 141 140  K 4.5 3.8  CL 103 106  CO2 30 25  GLUCOSE 156* 101*  BUN 14 15  CREATININE 0.62 0.58  CALCIUM 9.2 8.6*   Liver Function Tests No results for input(s): AST, ALT, ALKPHOS, BILITOT, PROT, ALBUMIN in the last 72 hours. No results for input(s): LIPASE, AMYLASE in the last 72 hours. Cardiac Enzymes No results for input(s): CKTOTAL, CKMB, CKMBINDEX, TROPONINI in the last 72 hours. BNP Invalid input(s): POCBNP D-Dimer No results for input(s): DDIMER in the last 72 hours. Hemoglobin A1C No results for input(s): HGBA1C in the last 72 hours. Fasting Lipid Panel No results for input(s): CHOL, HDL, LDLCALC, TRIG, CHOLHDL, LDLDIRECT in the last 72 hours. Thyroid Function Tests No results  for input(s): TSH, T4TOTAL, T3FREE, THYROIDAB in the last 72 hours.  Invalid input(s): FREET3 _____________  CARDIAC CATHETERIZATION  Result Date: 04/18/2019  There is hyperdynamic left ventricular systolic function. The left ventricular ejection fraction is greater than 65% by visual estimate.  LV end diastolic pressure is normal.  Mid LAD lesion is 55% stenosed.-NOT SIGNIFICANT, FFR 0.85  Widely patent anomalous circumflex.  Widely patent dominant RCA  SUMMARY  Moderate LAD-2nd Diag bifurcation lesion in the following portion of the LAD-FFR negative.  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  Borderline LVEDP despite systemic hypertension. 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  Considered Ranexa, however has a flag for red dye allergy (would need to discuss with pharmacy) Bryan Lemma, M.D., M.S. Interventional Cardiologist   ECHOCARDIOGRAM COMPLETE  Result Date: 04/18/2019   ECHOCARDIOGRAM REPORT   Patient Name:   Judith Stevens Date of Exam: 04/18/2019 Medical Rec #:  [id number]             Height:       64.0 in Accession #:    [id number]            Weight:       130.1 lb Date of Birth:  1940-08-29              BSA:          1.63 m Patient Age:    78 years              BP:           153/66 mmHg Patient Gender: F                     HR:           76 bpm. Exam Location:  Inpatient  Procedure: 2D Echo, Color Doppler and Cardiac Doppler Indications:    R94.31 Abnormal EKG  History:        Patient has prior history of Echocardiogram examinations, most                 recent 04/29/2016. CAD, COPD; Risk Factors:Hypertension and                 Dyslipidemia.  Sonographer:    Irving Burton Senior RDCS Referring Phys: [phone] LINDSAY B ROBERTS IMPRESSIONS  1. Left ventricular ejection fraction, by visual estimation, is 65 to 70%. The left ventricle has hyperdynamic function. There is no left ventricular hypertrophy.  2. Left ventricular diastolic parameters are indeterminate.  3. Mildly dilated left ventricular internal cavity size.  4. The left ventricle has no regional wall motion abnormalities.  5. Global right ventricle has normal systolic function.The right ventricular size is normal. No increase in right ventricular wall thickness.  6. Left atrial size was mildly dilated.  7. Right atrial size was normal.  8. Moderate mitral annular calcification.  9. The mitral valve is normal in structure. Trivial mitral valve regurgitation. 10. The tricuspid valve is normal in structure. 11. The tricuspid valve is normal in structure. Tricuspid valve regurgitation is trivial. 12. The aortic valve has an indeterminant number of cusps. Aortic valve regurgitation is not visualized. 13. The pulmonic valve was grossly normal. Pulmonic valve regurgitation is not visualized. 14. The ascending aorta was not well visualized. 15. The inferior vena cava is normal in size with greater than 50% respiratory variability, suggesting right atrial pressure of 3 mmHg. FINDINGS  Left Ventricle: Left ventricular ejection fraction, by visual estimation, is 65 to 70%. The left ventricle has hyperdynamic function. The left ventricle has no regional wall motion abnormalities. The left ventricular internal cavity size was mildly dilated left ventricle. There is no left ventricular hypertrophy. Left ventricular diastolic parameters are  indeterminate. Right Ventricle: The right ventricular size is normal. No increase in right ventricular wall thickness. Global RV systolic function is  has normal systolic function. Left Atrium: Left atrial size was mildly dilated. Right Atrium: Right atrial size was normal in size Pericardium: There is no evidence of pericardial effusion. Mitral Valve: The mitral valve is normal in structure. There is mild thickening of the mitral valve leaflet(s). There is mild calcification of the mitral valve leaflet(s). Moderate mitral annular calcification. Trivial mitral valve regurgitation. Tricuspid Valve: The tricuspid valve is normal in structure. Tricuspid valve regurgitation is trivial. Aortic Valve: The aortic valve has an indeterminant number of cusps. Aortic valve regurgitation is not visualized. Pulmonic Valve: The pulmonic valve was grossly normal. Pulmonic valve regurgitation is not visualized. Pulmonic regurgitation is not visualized. Aorta: The ascending aorta was not well visualized and the aortic root is normal in size and structure. Venous: The inferior vena cava is normal in size with greater than 50% respiratory variability, suggesting right atrial pressure of 3 mmHg. IAS/Shunts: No atrial level shunt detected by color flow Doppler.  LEFT VENTRICLE PLAX 2D LVIDd:         5.20 cm  Diastology LVIDs:         3.20 cm  LV e' lateral:   7.40 cm/s LV PW:         0.80 cm  LV E/e' lateral: 13.0 LV IVS:        0.60 cm  LV e' medial:    8.59 cm/s LVOT diam:     1.80 cm  LV E/e' medial:  11.2 LV SV:         89 ml LV SV Index:   54.11 LVOT Area:     2.54 cm  RIGHT VENTRICLE RV S prime:     11.20 cm/s TAPSE (M-mode): 2.4 cm LEFT ATRIUM             Index       RIGHT ATRIUM           Index LA diam:        4.20 cm 2.58 cm/m  RA Area:     14.90 cm LA Vol (A2C):   60.0 ml 36.82 ml/m RA Volume:   34.10 ml  20.93 ml/m LA Vol (A4C):   41.0 ml 25.16 ml/m LA Biplane Vol: 53.1 ml 32.58 ml/m  AORTIC VALVE LVOT Vmax:   138.00  cm/s LVOT Vmean:  81.000 cm/s LVOT VTI:    0.266 m MITRAL VALVE MV Area (PHT): 2.99 cm              SHUNTS MV PHT:        73.66 msec            Systemic VTI:  0.27 m MV Decel Time: 254 msec              Systemic Diam: 1.80 cm MV E velocity: 96.10 cm/s  103 cm/s MV A velocity: 135.00 cm/s 70.3 cm/s MV E/A ratio:  0.71        1.5  Jodelle RedBridgette Christopher MD Electronically signed by Jodelle RedBridgette Christopher MD Signature Date/Time: 04/18/2019/1:00:48 PM    Final    Disposition   Pt is being discharged home today in good condition.  Follow-up Plans & Appointments    Follow-up Information    Ronney AstersCleaver, Jesse M, NP Follow up on 04/28/2019.   Specialty: Cardiology Why: at 2:15pm for your follow up appt.  Contact information: [address] [place] [address] [phone]             Discharge Medications  Medication List    TAKE these medications   Advair Diskus 250-50 MCG/DOSE Aepb Generic drug: Fluticasone-Salmeterol Inhale 1 puff into the lungs 2 (two) times daily.   ALPRAZolam 1 MG tablet Commonly known as: XANAX Take 1 mg by mouth 3 (three) times daily as needed for anxiety.   aspirin EC 81 MG tablet Take 81 mg by mouth daily.   atorvastatin 80 MG tablet Commonly known as: LIPITOR Take 1 tablet (80 mg total) by mouth daily.   budesonide 0.5 MG/2ML nebulizer solution Commonly known as: PULMICORT USE AS DIRECTED FOR NASAL RINSE   butalbital-acetaminophen-caffeine 50-325-40 MG tablet Commonly known as: FIORICET Take 1 tablet by mouth every 6 (six) hours as needed.   diltiazem 300 MG 24 hr capsule Commonly known as: CARDIZEM CD Take 1 capsule (300 mg total) by mouth at bedtime.   escitalopram 20 MG tablet Commonly known as: LEXAPRO Take 1 tablet (20 mg total) by mouth daily.   ezetimibe 10 MG tablet Commonly known as: ZETIA Take 1 tablet (10 mg total) by mouth daily. What changed: when to take this   isosorbide mononitrate 30 MG 24 hr  tablet Commonly known as: IMDUR Take 1 tablet (30 mg total) by mouth daily.   metoprolol tartrate 25 MG tablet Commonly known as: LOPRESSOR Take 1 tablet (25 mg total) by mouth 2 (two) times daily. What changed: how much to take   MULTI-VITAMIN DAILY PO Take 1 tablet by mouth daily.   nitroGLYCERIN 0.4 MG SL tablet Commonly known as: Nitrostat Place 1 tablet (0.4 mg total) under the tongue every 5 (five) minutes as needed for chest pain. Needs appointment for future refills   olmesartan 20 MG tablet Commonly known as: BENICAR Take 1.5 tablets (30 mg total) by mouth daily. Take 30-40mg  as needed for BP   ondansetron 4 MG tablet Commonly known as: ZOFRAN Take 4 mg by mouth every 8 (eight) hours as needed for nausea.   pantoprazole 40 MG tablet Commonly known as: PROTONIX Take 40 mg by mouth 2 (two) times daily.   terbutaline 5 MG tablet Commonly known as: BRETHINE Take 5 mg by mouth every 6 (six) hours as needed (asthma).   Ventolin HFA 108 (90 Base) MCG/ACT inhaler Generic drug: albuterol Inhale 2 puffs into the lungs every 6 (six) hours as needed for wheezing.   Vitamin D3 50 MCG (2000 UT) Tabs Take 2,000 Units by mouth daily.        No                               Did the patient have a percutaneous coronary intervention (stent / angioplasty)?:  No.      Outstanding Labs/Studies   N/a   Duration of Discharge Encounter   Greater than 30 minutes including physician time.  Signed, Laverda PageLindsay Roberts NP-C 04/18/2019, 2:43 PM

## 2019-04-18 NOTE — Progress Notes (Signed)
Echocardiogram 2D Echocardiogram has been performed.  [name] 04/18/2019, 11:42 AM

## 2019-04-18 NOTE — Progress Notes (Addendum)
The patient has been seen in conjunction with Reino Bellis, NP. All aspects of care have been considered and discussed. The patient has been personally interviewed, examined, and all clinical data has been reviewed.   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  Recent increase in dyspnea with chest pain that is atypical.  Inferolateral T wave inversion was noted several days ago on EKG.  No chest pain since admission to the hospital yesterday.  Markers are negative.  Coronary angiography is being performed today to define anatomy, rule out progression of disease and ostial LAD, and identify de novo disease if present.  Will be eligible for discharge later today if cath reveals that continued medical therapy is the most appropriate approach.   Progress Note  Patient Name: Judith Stevens Date of Encounter: 04/18/2019  Primary Cardiologist: Shelva Majestic, MD   Subjective   No chest pain overnight. C/o nasal congestion.   Inpatient Medications    Scheduled Meds: . aspirin EC  81 mg Oral Daily  . atorvastatin  80 mg Oral q1800  . cholecalciferol  2,000 Units Oral Daily  . diltiazem  300 mg Oral QHS  . escitalopram  20 mg Oral Daily  . ezetimibe  10 mg Oral Daily  . irbesartan  150 mg Oral Daily  . isosorbide mononitrate  30 mg Oral Daily  . metoprolol tartrate  12.5 mg Oral BID  . pantoprazole  40 mg Oral BID  . sodium chloride flush  3 mL Intravenous Q12H   Continuous Infusions: . sodium chloride    . sodium chloride 1 mL/kg/hr (04/18/19 0523)  . heparin 700 Units/hr (04/18/19 0523)   PRN Meds: sodium chloride, acetaminophen, acetaminophen-codeine, albuterol, ALPRAZolam, nitroGLYCERIN, ondansetron (ZOFRAN) IV, sodium chloride, sodium chloride flush   Vital Signs    Vitals:   04/17/19 2010 04/17/19 2100 04/18/19 0400 04/18/19 0744  BP: (!) 156/59  (!) 174/57   Pulse: (!) 57  65   Resp:  20    Temp: 97.6 F (36.4 C)  97.7 F (36.5  C) 97.8 F (36.6 C)  TempSrc: Oral     SpO2: 97%  97%   Weight:   59 kg   Height:        Intake/Output Summary (Last 24 hours) at 04/18/2019 0808 Last data filed at 04/18/2019 0526 Gross per 24 hour  Intake 547.29 ml  Output -  Net 547.29 ml   Last 3 Weights 04/18/2019 04/17/2019 04/17/2019  Weight (lbs) 130 lb 1.6 oz 131 lb 8 oz 134 lb  Weight (kg) 59.013 kg 59.648 kg 60.782 kg      Telemetry    SR with PVCs, deep TWI - Personally Reviewed  ECG    SR with deep TWI inferolateral leads - Personally Reviewed  Physical Exam  Pleasant older WF, sitting up in bed.  GEN: No acute distress.   Neck: No JVD Cardiac: RRR, no murmurs, rubs, or gallops.  Respiratory: Clear to auscultation bilaterally. GI: Soft, nontender, non-distended  MS: No edema; No deformity. Neuro:  Nonfocal  Psych: Normal affect   Labs    High Sensitivity Troponin:   Recent Labs  Lab 04/17/19 1617 04/17/19 1753  TROPONINIHS 10 11      Chemistry Recent Labs  Lab 04/17/19 1617 04/18/19 0018  NA 141 140  K 4.5 3.8  CL 103 106  CO2 30 25  GLUCOSE 156* 101*  BUN 14 15  CREATININE 0.62 0.58  CALCIUM 9.2 8.6*  GFRNONAA >60 >60  GFRAA >60 >60  ANIONGAP 8 9     Hematology Recent Labs  Lab 04/17/19 1617 04/18/19 0018  WBC 4.8 4.6  RBC 3.82* 3.62*  HGB 11.9* 11.3*  HCT 35.8* 33.4*  MCV 93.7 92.3  MCH 31.2 31.2  MCHC 33.2 33.8  RDW 13.0 12.8  PLT 132* 123*    BNPNo results for input(s): BNP, PROBNP in the last 168 hours.   DDimer No results for input(s): DDIMER in the last 168 hours.   Radiology    No results found.  Cardiac Studies   N/a   Patient Profile     79 y.o. female with PMH of CAD 60% lesion in ostial LAD dating back to 2001, HTN, HL and asthma who presented as a direct admit for worsening dyspnea on exertion. Planned for cardiac cath.   Assessment & Plan    1. Nonobstructive CAD with worsening DOE: recently seen in the office and reported worsening dyspnea on  exertion and EKG showed new inferolateral TWI which were worrisome for ischemia. Initially did not want to come in the hospital but presented as a direct admission. hsTn have been negative.  -- remains on IV heparin with plans for cardiac cath today -- on asa, statin, BB, and ARB -- will order echo to assess for decline in EF  2. HTN: blood pressures have been elevated, but reports better control at home. She is quite anxious about cath today. Will continue with current regimen and adjust based on cath and Echo report.   3. HL: on statin and Zetia -- check lipids  4. Anxiety: on lexapro and PRN xanax  5. Tobacco use: intermittent use over the years. Currently quit.  For questions or updates, please contact [facility] Please consult [email or web address] for contact info under        Signed, Laverda Page, NP  04/18/2019, 8:08 AM

## 2019-04-18 NOTE — Discharge Instructions (Signed)
Radial Site Care  This sheet gives you information about how to care for yourself after your procedure. Your health care provider may also give you more specific instructions. If you have problems or questions, contact your health care provider. What can I expect after the procedure? After the procedure, it is common to have:  Bruising and tenderness at the catheter insertion area. Follow these instructions at home: Medicines  Take over-the-counter and prescription medicines only as told by your health care provider. Insertion site care  Follow instructions from your health care provider about how to take care of your insertion site. Make sure you: ? Wash your hands with soap and water before you change your bandage (dressing). If soap and water are not available, use hand sanitizer. ? Change your dressing as told by your health care provider. ? Leave stitches (sutures), skin glue, or adhesive strips in place. These skin closures may need to stay in place for 2 weeks or longer. If adhesive strip edges start to loosen and curl up, you may trim the loose edges. Do not remove adhesive strips completely unless your health care provider tells you to do that.  Check your insertion site every day for signs of infection. Check for: ? Redness, swelling, or pain. ? Fluid or blood. ? Pus or a bad smell. ? Warmth.  Do not take baths, swim, or use a hot tub until your health care provider approves.  You may shower 24-48 hours after the procedure, or as directed by your health care provider. ? Remove the dressing and gently wash the site with plain soap and water. ? Pat the area dry with a clean towel. ? Do not rub the site. That could cause bleeding.  Do not apply powder or lotion to the site. Activity   For 24 hours after the procedure, or as directed by your health care provider: ? Do not flex or bend the affected arm. ? Do not push or pull heavy objects with the affected arm. ? Do not  drive yourself home from the hospital or clinic. You may drive 24 hours after the procedure unless your health care provider tells you not to. ? Do not operate machinery or power tools.  Do not lift anything that is heavier than 10 lb (4.5 kg), or the limit that you are told, until your health care provider says that it is safe.  Ask your health care provider when it is okay to: ? Return to work or school. ? Resume usual physical activities or sports. ? Resume sexual activity. General instructions  If the catheter site starts to bleed, raise your arm and put firm pressure on the site. If the bleeding does not stop, get help right away. This is a medical emergency.  If you went home on the same day as your procedure, a responsible adult should be with you for the first 24 hours after you arrive home.  Keep all follow-up visits as told by your health care provider. This is important. Contact a health care provider if:  You have a fever.  You have redness, swelling, or yellow drainage around your insertion site. Get help right away if:  You have unusual pain at the radial site.  The catheter insertion area swells very fast.  The insertion area is bleeding, and the bleeding does not stop when you hold steady pressure on the area.  Your arm or hand becomes pale, cool, tingly, or numb. These symptoms may represent a serious problem   that is an emergency. Do not wait to see if the symptoms will go away. Get medical help right away. Call your local emergency services (911 in the [place] Do not drive yourself to the hospital. Summary  After the procedure, it is common to have bruising and tenderness at the site.  Follow instructions from your health care provider about how to take care of your radial site wound. Check the wound every day for signs of infection.  Do not lift anything that is heavier than 10 lb (4.5 kg), or the limit that you are told, until your health care provider says  that it is safe. This information is not intended to replace advice given to you by your health care provider. Make sure you discuss any questions you have with your health care provider. Document Revised: 04/21/2017 Document Reviewed: 04/21/2017 Elsevier Patient Education  2020 [employer]  

## 2019-04-18 NOTE — Interval H&P Note (Signed)
History and Physical Interval Note:  04/18/2019 8:59 AM  Judith Stevens  has presented today for surgery, with the diagnosis of Unstable Angina  with Abnormal EKG.  The various methods of treatment have been discussed with the patient and family. After consideration of risks, benefits and other options for treatment, the patient has consented to  Procedure(s): LEFT HEART CATH AND CORONARY ANGIOGRAPHY (N/A) as a surgical intervention.  The patient's history has been reviewed, patient examined, no change in status, stable for surgery.  I have reviewed the patient's chart and labs.  Questions were answered to the patient's satisfaction.    Cath Lab Visit (complete for each Cath Lab visit)  Clinical Evaluation Leading to the Procedure:   ACS: Yes.    Non-ACS:    Anginal Classification: CCS III  Anti-ischemic medical therapy: Maximal Therapy (2 or more classes of medications)  Non-Invasive Test Results: No non-invasive testing performed  Prior CABG: No previous CABG   Bryan Lemma

## 2019-04-26 ENCOUNTER — Telehealth: Payer: Self-pay | Admitting: General Practice

## 2019-04-26 NOTE — Telephone Encounter (Signed)
noted 

## 2019-04-26 NOTE — Telephone Encounter (Signed)
Patient wanted to let the office know that her daughter will be coming with her to her appointment on Friday.  She has some memory issues at times and has a hard time remembering what the doctors have to say.

## 2019-04-27 NOTE — Progress Notes (Signed)
Cardiology Clinic Note   Patient Name: Colan NeptuneJacqueline M Hoare Date of Encounter: 04/28/2019  Primary Care Provider:  Richmond CampbellKaplan, Kristen W., PA-C Primary Cardiologist:  Nicki Guadalajarahomas Kelly, MD  Patient Profile    Geraldine SolarJacqueline M. Lelon PerlaSaunders 79 year old female presents today for follow-up of her unstable angina status post LHC (moderate LAD-second diagonal bifurcation lesion, medical management)  Past Medical History    Past Medical History:  Diagnosis Date  . Anxiety   . Aortic valve stenosis   . Arthritis   . Asthma   . CAD (coronary artery disease)    single vessel  . COPD (chronic obstructive pulmonary disease) (HCC)   . GERD (gastroesophageal reflux disease)   . Headache   . History of stress test 10/2011   Resting images reveal a normal pattern of perfusion in all regions. The post stress myocardial perfusion images show a normal pattern of perfusion in all region. The post stress left ventricle is normal size. the rest left ventricle is normal size, there is no scintigraphic evidence of inductible myocardial ischemia.  Marland Kitchen. Hx of echocardiogram 09/2009   EF >55% Normal Echo  . Hyperlipidemia   . Hypertension   . Migraines   . Pneumonia    hx of  . Pulmonary emboli (HCC) 5/12  . Shortness of breath dyspnea    exertion or eating something shes allergic too  . Tobacco abuse    Past Surgical History:  Procedure Laterality Date  . ADENOIDECTOMY    . BACK SURGERY  2012  . CARDIAC CATHETERIZATION  10/13/2007   60% narrowing twin LAD system  . DILATION AND CURETTAGE OF UTERUS    . FOOT SURGERY  2002,2013   right  . INTRAVASCULAR PRESSURE WIRE/FFR STUDY N/A 04/18/2019   Procedure: INTRAVASCULAR PRESSURE WIRE/FFR STUDY;  Surgeon: Marykay LexHarding, David W, MD;  [facility];  Service: Cardiovascular;  Laterality: N/A;  . KNEE SURGERY  01/17/2007   right  . LEFT HEART CATH AND CORONARY ANGIOGRAPHY N/A 04/18/2019   Procedure: LEFT HEART CATH AND CORONARY ANGIOGRAPHY;  Surgeon:  Marykay LexHarding, David W, MD;  [facility];  Service: Cardiovascular;  Laterality: N/A;  . SACROILIAC JOINT FUSION Right 03/28/2014   Procedure: SACROILIAC JOINT FUSION;  Surgeon: Emilee HeroMark Leonard Dumonski, MD;  [facility];  Service: Orthopedics;  Laterality: Right;  Right sided sacroliac joint fusion  . TONSILLECTOMY      Allergies  Allergies  Allergen Reactions  . Cefzil [Cefprozil] Shortness Of Breath  . Keflex [Cephalexin] Shortness Of Breath and Rash  . Lactose Intolerance (Gi) Shortness Of Breath    ALL DAIRY PRODUCTS  . Levaquin [Levofloxacin] Shortness Of Breath and Rash  . Naproxen Shortness Of Breath    Shortness of breath Shortness of breath  . Oxycodone-Acetaminophen Nausea And Vomiting and Shortness Of Breath  . Peanuts [Peanut Oil] Shortness Of Breath  . Penicillins Shortness Of Breath  . Percocet [Oxycodone-Acetaminophen] Shortness Of Breath  . Red Dye Shortness Of Breath  . Sulfa Antibiotics Shortness Of Breath  . Sulfacetamide Sodium Shortness Of Breath  . Tetracycline Hcl Shortness Of Breath  . Tetracyclines & Related Shortness Of Breath  . Methocarbamol Nausea And Vomiting  . Prednisone Other (See Comments)    Rash  . Tomato Other (See Comments)    SOB  . Amoxicillin-Pot Clavulanate Rash  . Ibuprofen Rash  . Moxifloxacin Rash  . Penicillin G Rash  . Sulfamethoxazole-Trimethoprim Rash  . Tetracycline Rash  . Tramadol Rash    History  of Present Illness    Ms. Perezgarcia is a past medical history of coronary artery disease (60% stenosis in ostial LAD that dates back to 2001), hypertension, hyperlipidemia, and asthma.  She underwent cardiac catheterization in 2001 in [place] that showed a 60% ostial narrowing of her LAD.  She had a repeat cardiac catheterization by Dr. Claiborne Billings in 2019 showed no significant change.  A nuclear stress test August 2013 showed no new changes from 2 years prior and showed normal perfusion.  She underwent right hip surgery  in 2015.  She had a 2-year follow-up nuclear stress test 03/13/2014 preoperatively.  This showed normal perfusion without scar or ischemia.  She tolerated her hip surgery well.  An echocardiogram 02/18/2015 showed an EF of 55-60%, moderate thickening and calcification of a trileaflet aortic valve, which was consistent with sclerosis and no stenosis.  Mitral annular calcification was also present.  She was seen by Dr. Claiborne Billings 01/2017 after she had been taking care of her sister who had undergone aortic stent grafting.  She was seen by pulmonary and underwent a coronary CT which showed a calcium score of 81 which was 93% for her age and gender.  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  There was diffuse calcified plaque in the RCA and proximal LAD narrowing of 25-50%.  She underwent FFR analysis with both lesions which were in the normal FFR range of greater than 0.8.  She was seen by Dr. Claiborne Billings in the office 04/12/2019.  During that time she stated over the last several months she had felt fairly well but in the past 6-8 weeks she has been more short of breath.  3 weeks prior she has had an episode of chest tightness which resolved.  She denied any significant chest discomfort since that time and also denied nocturnal symptoms.  She had experienced shortness of breath with activity and was taking terbutaline without improvement of her symptoms.  She denied PND and orthopnea at that time.  Her EKG showed sinus rhythm 70 bpm with isolated PVCs and new inferior anterior T wave inversion 3-5 mm in leads II,III, aVF, and V4-V6.  A cardiac catheterization was recommended.  She underwent cardiac catheterization 04/18/2019 which showed a moderate LAD second diagonal bifurcation lesion.  Continuation of medical management was recommended.  She presents the clinic today and states she is feeling somewhat better since her cardiac  catheterization.  She continues to have some shortness of breath but states that when she takes her inhaler her terbutaline and Xanax she feels much better.  She is ready to increase her physical activity and states that she has an exercise bike that she would like to use.  Given that she is feeling somewhat better today I will not change any medications at this time.  She is concerned about aspirin and risk for bleeding I have instructed her to inspect her prescription and make sure that it is enteric-coated.  Finally she stated that she did not appreciate comments made in the Cath Lab about her smoking and the contribution to her vascular disease by Dr. Ellyn Hack.  I will give her the salty 6 sheet, a blood pressure log, and have instructed her to increase her physicalslowly.  Follow-up with Dr. Claiborne Billings in 3 months.  She denies chest pain, increased shortness of breath, lower extremity edema, fatigue, melena, hematuria, hemoptysis, diaphoresis, weakness, presyncope, syncope, orthopnea, and PND.   Home Medications  Prior to Admission medications   Medication Sig Start Date End Date Taking? Authorizing Provider  ALPRAZolam Prudy Feeler) 1 MG tablet Take 1 mg by mouth 3 (three) times daily as needed for anxiety.  04/11/18   [provider]  aspirin EC 81 MG tablet Take 81 mg by mouth daily.    [provider]  atorvastatin (LIPITOR) 80 MG tablet Take 1 tablet (80 mg total) by mouth daily. 04/26/18   Lennette Bihari, MD  budesonide (PULMICORT) 0.5 MG/2ML nebulizer solution USE AS DIRECTED FOR NASAL RINSE Patient not taking: Reported on 04/17/2019 02/04/17   Bobbitt, Heywood Iles, MD  butalbital-acetaminophen-caffeine (FIORICET) [phone] MG tablet Take 1 tablet by mouth every 6 (six) hours as needed. 03/22/19   [provider]  Cholecalciferol (VITAMIN D3) 2000 UNITS TABS Take 2,000 Units by mouth daily.     [provider]  diltiazem (CARDIZEM CD) 300 MG 24 hr capsule Take 1  capsule (300 mg total) by mouth at bedtime. 04/05/19   Lennette Bihari, MD  escitalopram (LEXAPRO) 20 MG tablet Take 1 tablet (20 mg total) by mouth daily. 04/13/13   Lennette Bihari, MD  ezetimibe (ZETIA) 10 MG tablet Take 1 tablet (10 mg total) by mouth daily. Patient taking differently: Take 10 mg by mouth at bedtime.  04/26/18   Lennette Bihari, MD  Fluticasone-Salmeterol (ADVAIR DISKUS) 250-50 MCG/DOSE AEPB Inhale 1 puff into the lungs 2 (two) times daily. 03/11/16   [provider]  isosorbide mononitrate (IMDUR) 30 MG 24 hr tablet Take 1 tablet (30 mg total) by mouth daily. 04/14/19 07/13/19  Lennette Bihari, MD  metoprolol tartrate (LOPRESSOR) 25 MG tablet Take 1 tablet (25 mg total) by mouth 2 (two) times daily. 04/18/19 10/15/19  Arty Baumgartner, NP  Multiple Vitamin (MULTI-VITAMIN DAILY PO) Take 1 tablet by mouth daily.    [provider]  nitroGLYCERIN (NITROSTAT) 0.4 MG SL tablet Place 1 tablet (0.4 mg total) under the tongue every 5 (five) minutes as needed for chest pain. Needs appointment for future refills 04/26/18   Lennette Bihari, MD  olmesartan (BENICAR) 20 MG tablet Take 1.5 tablets (30 mg total) by mouth daily. Take 30-40mg  as needed for BP 12/14/18   Lennette Bihari, MD  ondansetron (ZOFRAN) 4 MG tablet Take 4 mg by mouth every 8 (eight) hours as needed for nausea.    [provider]  pantoprazole (PROTONIX) 40 MG tablet Take 40 mg by mouth 2 (two) times daily.  09/20/17   [provider]  terbutaline (BRETHINE) 5 MG tablet Take 5 mg by mouth every 6 (six) hours as needed (asthma).     [provider]  VENTOLIN HFA 108 (90 Base) MCG/ACT inhaler Inhale 2 puffs into the lungs every 6 (six) hours as needed for wheezing.  12/20/15   [provider]    Family History    Family History  Problem Relation Age of Onset  . Heart attack Mother   . Diabetes Mother   . Asthma Mother   . Heart attack Father   . Diabetes Sister   .  Hyperlipidemia Sister   . Allergic rhinitis Neg Hx   . Angioedema Neg Hx   . Eczema Neg Hx   . Immunodeficiency Neg Hx   . Urticaria Neg Hx    She indicated that her mother is deceased. She indicated that her father is deceased. She indicated that only one of her three sisters is alive. She indicated that  her maternal grandmother is deceased. She indicated that her maternal grandfather is deceased. She indicated that her paternal grandmother is deceased. She indicated that her paternal grandfather is deceased. She indicated that the status of her neg hx is unknown.  Social History    Social History   Socioeconomic History  . Marital status: Widowed    Spouse name: Not on file  . Number of children: Not on file  . Years of education: Not on file  . Highest education level: Not on file  Occupational History  . Not on file  Tobacco Use  . Smoking status: Former Smoker    Packs/day: 0.10    Types: Cigarettes    Start date: 01/30/2014    Quit date: 04/15/2019    Years since quitting: 0.0  . Smokeless tobacco: Former Neurosurgeon    Quit date: 01/29/2014  Substance and Sexual Activity  . Alcohol use: Yes    Alcohol/week: 4.0 standard drinks    Types: 2 Glasses of wine, 2 Cans of beer per week  . Drug use: No  . Sexual activity: Not on file  Other Topics Concern  . Not on file  Social History Narrative  . Not on file   Social Determinants of Health   Financial Resource Strain:   . Difficulty of Paying Living Expenses: Not on file  Food Insecurity:   . Worried About Programme researcher, broadcasting/film/video in the Last Year: Not on file  . Ran Out of Food in the Last Year: Not on file  Transportation Needs:   . Lack of Transportation (Medical): Not on file  . Lack of Transportation (Non-Medical): Not on file  Physical Activity:   . Days of Exercise per Week: Not on file  . Minutes of Exercise per Session: Not on file  Stress:   . Feeling of Stress : Not on file  Social Connections:   . Frequency of  Communication with Friends and Family: Not on file  . Frequency of Social Gatherings with Friends and Family: Not on file  . Attends Religious Services: Not on file  . Active Member of Clubs or Organizations: Not on file  . Attends Banker Meetings: Not on file  . Marital Status: Not on file  Intimate Partner Violence:   . Fear of Current or Ex-Partner: Not on file  . Emotionally Abused: Not on file  . Physically Abused: Not on file  . Sexually Abused: Not on file     Review of Systems    General:  No chills, fever, night sweats or weight changes.  Cardiovascular:  No chest pain, dyspnea on exertion, edema, orthopnea, palpitations, paroxysmal nocturnal dyspnea. Dermatological: No rash, lesions/masses Respiratory: No cough, dyspnea Urologic: No hematuria, dysuria Abdominal:   No nausea, vomiting, diarrhea, bright red blood per rectum, melena, or hematemesis Neurologic:  No visual changes, wkns, changes in mental status. All other systems reviewed and are otherwise negative except as noted above.  Physical Exam    VS:  BP (!) 142/68   Pulse (!) 57   Temp (!) 96.8 F (36 C)   Ht 5\' 4"  (1.626 m)   Wt 132 lb 3.2 oz (60 kg)   LMP  (LMP Unknown)   SpO2 91%   BMI 22.69 kg/m  , BMI Body mass index is 22.69 kg/m. GEN: Well nourished, well developed, in no acute distress. HEENT: normal. Neck: Supple, no JVD, carotid bruits, or masses. Cardiac: RRR, no murmurs, rubs, or gallops. No clubbing, cyanosis, edema.  Radials/DP/PT 2+ and equal bilaterally.  Respiratory:  Respirations regular and unlabored, clear to auscultation bilaterally. GI: Soft, nontender, nondistended, BS + x 4. MS: no deformity or atrophy. Skin: warm and dry, no rash.  Right radial access site healing contusion.  Clean, dry, intact, no drainage. Neuro:  Strength and sensation are intact. Psych: Normal affect.  Accessory Clinical Findings    ECG personally reviewed by me today-none today- No acute  changes  EKG 04/17/2019  Sinus bradycardia 51 bpm LVH by voltage criteria.  More deeply inverted inferiolateral T wave inversion 8-10 mm in the anteriolateral leads and 2-4 mm inferiorly  EKG 04/14/2019 Normal sinus rhythm 70 bpm New inferior and anterolateral T wave inversion  EKG 12/18/2018 Sinus bradycardia 58 bpm nonspecific ST changes  TTE: 04/18/19  IMPRESSIONS   1. Left ventricular ejection fraction, by visual estimation, is 65 to 70%. The left ventricle has hyperdynamic function. There is no left ventricular hypertrophy. 2. Left ventricular diastolic parameters are indeterminate. 3. Mildly dilated left ventricular internal cavity size. 4. The left ventricle has no regional wall motion abnormalities. 5. Global right ventricle has normal systolic function.The right ventricular size is normal. No increase in right ventricular wall thickness. 6. Left atrial size was mildly dilated. 7. Right atrial size was normal. 8. Moderate mitral annular calcification. 9. The mitral valve is normal in structure. Trivial mitral valve regurgitation. 10. The tricuspid valve is normal in structure. 11. The tricuspid valve is normal in structure. Tricuspid valve regurgitation is trivial. 12. The aortic valve has an indeterminant number of cusps. Aortic valve regurgitation is not visualized. 13. The pulmonic valve was grossly normal. Pulmonic valve regurgitation is not visualized. 14. The ascending aorta was not well visualized. 15. The inferior vena cava is normal in size with greater than 50% respiratory variability, suggesting right atrial pressure of 3 mmHg  Cardiac catheterization 04/17/2018  There is hyperdynamic left ventricular systolic function. The left ventricular ejection fraction is greater than 65% by visual estimate.  LV end diastolic pressure is normal.  Mid LAD lesion is 55% stenosed.-NOT SIGNIFICANT, FFR 0.85  Widely patent anomalous circumflex.  Widely patent  dominant RCA  SUMMARY  Moderate LAD-2nd Diag bifurcation lesion in the following portion of the LAD-FFR negative.  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  Borderline LVEDP despite systemic hypertension.   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  Considered Ranexa, however has a flag for red dye allergy (would need to discuss with pharmacy)   Bryan Lemmaavid Harding, M.D., M.S. Interventional Cardiologist    Diagnostic Dominance: Right    Assessment & Plan   1.  Coronary artery disease-cardiac catheterization 04/18/2019 shown above.  Continue medical management recommended. Continue aspirin 81 mg daily Continue atorvastatin 80 mg tablet daily Continue diltiazem 300 mg daily Continue ezetimibe 10 mg daily Continue isosorbide mononitrate 30 mg tablet daily Continue metoprolol tartrate 25 mg twice daily Continue nitroglycerin 0.4 mg sublingual as needed  Continue olmesartan 30 mg daily Heart healthy low-sodium diet Increase physical activity as tolerated  Dyspnea on exertion-no increased dyspnea today in clinic.  She has been slowly increasing her physical activity. Continue Brethine 5 mg tablet every 6 hours for asthma as needed Continue Advair Diskus once daily 30 mg Followed by PCP  Essential hypertension-BP today 142/68.  Well-controlled at home 100s-130s over  60s Continue isosorbide mononitrate 30 mg tablet daily Continue metoprolol tartrate 25 mg twice daily Continue olmesartan 30 mg daily Heart healthy low-sodium diet-salty 6 given Increase physical activity as tolerated  Hyperlipidemia-12/09/2018: Cholesterol, Total 159; HDL 81; LDL Chol Calc (NIH) 68; Triglycerides 46 Continue atorvastatin 80 mg tablet daily Heart healthy low-sodium high-fiber diet Increase physical activity as tolerated  Disposition: Follow-up with Dr. Tresa Endo in 3 months.   Thomasene Ripple. Keahi Mccarney NP-C      [facility] [address] Office [phone] Fax [phone]

## 2019-04-28 ENCOUNTER — Ambulatory Visit (INDEPENDENT_AMBULATORY_CARE_PROVIDER_SITE_OTHER): Payer: Medicare Other | Admitting: General Practice

## 2019-04-28 ENCOUNTER — Encounter: Payer: Self-pay | Admitting: General Practice

## 2019-04-28 ENCOUNTER — Other Ambulatory Visit: Payer: Self-pay

## 2019-04-28 VITALS — BP 142/68 | HR 57 | Temp 96.8°F | Ht 64.0 in | Wt 132.2 lb

## 2019-04-28 DIAGNOSIS — E785 Hyperlipidemia, unspecified: Secondary | ICD-10-CM | POA: Diagnosis not present

## 2019-04-28 DIAGNOSIS — I10 Essential (primary) hypertension: Secondary | ICD-10-CM | POA: Diagnosis not present

## 2019-04-28 DIAGNOSIS — R0609 Other forms of dyspnea: Secondary | ICD-10-CM

## 2019-04-28 DIAGNOSIS — I25118 Atherosclerotic heart disease of native coronary artery with other forms of angina pectoris: Secondary | ICD-10-CM

## 2019-04-28 DIAGNOSIS — R0600 Dyspnea, unspecified: Secondary | ICD-10-CM

## 2019-04-28 NOTE — Patient Instructions (Signed)
Special Instructions: PLEASE READ AND FOLLOW SALTY 6 ATTACHED  PLEASE TAKE AND LOG YOU BLOOD PRESSURE 2-3 TIMES A WEEK AND BRING THIS LOG WITH YOU TO YOUR APPOINTMENTS  PLEASE TRY TO INCREASE YOU PHYSICAL ACTIVITY SLOWLY  Reduce your risk of getting COVID-19 With your heart disease it is especially important for people at increased risk of severe illness from COVID-19, and those who live with them, to protect themselves from getting COVID-19. The best way to protect yourself and to help reduce the spread of the virus that causes COVID-19 is to: Marland Kitchen Limit your interactions with other people as much as possible. . Take precautions to prevent getting COVID-19 when you do interact with others. If you start feeling sick and think you may have COVID-19, get in touch with your healthcare provider within 24 hours.  Follow-Up: 3 months  In Person Nicki Guadalajara, MD.    At [facility], you and your health needs are our priority.  As part of our continuing mission to provide you with exceptional heart care, we have created designated Provider Care Teams.  These Care Teams include your primary Cardiologist (physician) and Advanced Practice Providers (APPs -  Physician Assistants and Nurse Practitioners) who all work together to provide you with the care you need, when you need it.  Thank you for choosing [facility] at [facility]!!

## 2019-05-04 ENCOUNTER — Other Ambulatory Visit: Payer: Self-pay | Admitting: Cardiovascular Disease

## 2019-05-04 NOTE — Telephone Encounter (Signed)
Patient states that she has about 1 week worth of medication remaining.    *STAT* If patient is at the pharmacy, call can be transferred to refill team.   1. Which medications need to be refilled? (please list name of each medication and dose if known) atorvastatin (LIPITOR) 80 MG tablet  2. Which pharmacy/location (including street and city if local pharmacy) is medication to be sent to? [facility]  3. Do they need a 30 day or 90 day supply? 90 day supply

## 2019-05-05 MED ORDER — ATORVASTATIN CALCIUM 80 MG PO TABS: 80.0000 mg | ORAL_TABLET | Freq: Every day | ORAL | 3 refills | Status: DC

## 2019-05-28 ENCOUNTER — Ambulatory Visit: Payer: Medicare Other | Attending: Internal Medicine

## 2019-05-28 DIAGNOSIS — Z23 Encounter for immunization: Secondary | ICD-10-CM

## 2019-05-28 NOTE — Progress Notes (Signed)
   Covid-19 Vaccination Clinic  Name:  Judith Stevens    MRN: [id number] DOB: 1940/09/04  05/28/2019  [name] was observed post Covid-19 immunization for 30 minutes based on pre-vaccination screening without incidence. She was provided with Vaccine Information Sheet and instruction to access the V-Safe system.   [name] was instructed to call 911 with any severe reactions post vaccine: Marland Kitchen Difficulty breathing  . Swelling of your face and throat  . A fast heartbeat  . A bad rash all over your body  . Dizziness and weakness    Immunizations Administered    Name Date Dose VIS Date Route   Pfizer COVID-19 Vaccine 05/28/2019 11:58 AM 0.3 mL 03/10/2019 Intramuscular   Manufacturer: [employer], [employer]   Lot: [id number]   NDC: [address]-4

## 2019-06-27 ENCOUNTER — Ambulatory Visit: Payer: Medicare Other | Attending: Internal Medicine

## 2019-06-27 DIAGNOSIS — Z23 Encounter for immunization: Secondary | ICD-10-CM

## 2019-06-27 NOTE — Progress Notes (Signed)
   Covid-19 Vaccination Clinic  Name:  MEAGEN LIMONES    MRN: [id number] DOB: 10-Apr-1940  06/27/2019  Ms. Cottone was observed post Covid-19 immunization for 30 minutes based on pre-vaccination screening without incident. She was provided with Vaccine Information Sheet and instruction to access the V-Safe system.   Ms. Justo was instructed to call 911 with any severe reactions post vaccine: Marland Kitchen Difficulty breathing  . Swelling of face and throat  . A fast heartbeat  . A bad rash all over body  . Dizziness and weakness   Immunizations Administered    Name Date Dose VIS Date Route   Pfizer COVID-19 Vaccine 06/27/2019  4:52 PM 0.3 mL 03/10/2019 Intramuscular   Manufacturer: [employer], [employer]   Lot: [id number]   NDC: [address]-8

## 2019-07-14 ENCOUNTER — Encounter: Payer: Self-pay | Admitting: Cardiovascular Disease

## 2019-07-14 ENCOUNTER — Ambulatory Visit (INDEPENDENT_AMBULATORY_CARE_PROVIDER_SITE_OTHER): Payer: Medicare Other | Admitting: Cardiovascular Disease

## 2019-07-14 ENCOUNTER — Other Ambulatory Visit: Payer: Self-pay

## 2019-07-14 VITALS — BP 141/56 | HR 51 | Ht 64.0 in | Wt 133.0 lb

## 2019-07-14 DIAGNOSIS — E785 Hyperlipidemia, unspecified: Secondary | ICD-10-CM | POA: Diagnosis not present

## 2019-07-14 DIAGNOSIS — J45909 Unspecified asthma, uncomplicated: Secondary | ICD-10-CM

## 2019-07-14 DIAGNOSIS — I10 Essential (primary) hypertension: Secondary | ICD-10-CM

## 2019-07-14 DIAGNOSIS — F32A Depression, unspecified: Secondary | ICD-10-CM

## 2019-07-14 DIAGNOSIS — Z720 Tobacco use: Secondary | ICD-10-CM | POA: Diagnosis not present

## 2019-07-14 DIAGNOSIS — F419 Anxiety disorder, unspecified: Secondary | ICD-10-CM

## 2019-07-14 DIAGNOSIS — I25118 Atherosclerotic heart disease of native coronary artery with other forms of angina pectoris: Secondary | ICD-10-CM

## 2019-07-14 DIAGNOSIS — F329 Major depressive disorder, single episode, unspecified: Secondary | ICD-10-CM

## 2019-07-14 DIAGNOSIS — K219 Gastro-esophageal reflux disease without esophagitis: Secondary | ICD-10-CM

## 2019-07-14 MED ORDER — OLMESARTAN MEDOXOMIL 40 MG PO TABS: 40.0000 mg | ORAL_TABLET | Freq: Every day | ORAL | 3 refills | Status: DC

## 2019-07-14 NOTE — Progress Notes (Signed)
Patient ID: Judith Stevens, female   DOB: 27-Aug-1940, 79 y.o.   MRN: [id number]     HPI: Judith Stevens is a 79 y.o. female who presents for a 85-monthfollow-up cardiology evaluation.   Ms. SOlthoffhas CAD and [place] of [place] LAD. Repeat catheterization by me in 2009 did not show any significant interval change. Unfortunately, she has continued her ongoing tobacco habit but is now smoking significantly reduced at less than one quarter pack per day.  She has a history of hypertension as well as hyperlipidemia.  A nuclear perfusion study in August 2013 which was unchanged from 2 years previously and continued to show normal perfusion.   In December 2015 prior to undergoing right hip surgery a 2 year follow-up nuclear perfusion study on 03/13/2014 was performed preoperatively.  This continued to show normal perfusion without scar or ischemia.  She tolerated her hip surgery well.  She had smoked for 50 years before finally quitting smoking when I had seen her last.  When I last saw he 2 months ago, she had complained of experiencing increasing shortness of breath of 6 weeks duration that was not consistently exertionally precipitated.   She admitted to fatigue.  She is unaware of palpitations.  She has hyperlipidemia and has been taking atorvastatin 80 mg and Zetia 10 mg in attempt to continue to induce plaque regression.    An echo Doppler study on 02/18/2015 showed an EF of 55-60%, moderate thickening and calcification of a trileaflet aortic valve, consistent with sclerosis but without stenosis and mitral annular calcification.  Laboratory revealed excellent lipid studies with a total cholesterol 149, triglycerides 55, HDL 85 and her LDL was now 53.  Thyroid function studies were normal.  Her renal function was excellent.  LFTs were normal.  Vitamin D was normal at 41 and her vitamin B-12 level was normal at  448.    She was seen by Judith Stevens October 2017 with complaints of dyspnea on exertion.  She has a history of asthma as well as numerous allergies to foods and some drugs.  She tells me she had the flu in early January 2018.  When I last saw her in January 2018.  She had stop smoking.  She did have complaints of dyspnea.  I recommended a follow-up echo Doppler study which was done on 04/29/2016.  This showed normal LV function and normal diastolic parameters.  The aortic valve was mildly calcified and was without stenosis.  There was mild aortic insufficiency.  Her mitral valve annulus was calcified and leaflets were mildly thickened.  Her left atrium was mildly dilated.   When I saw her in November 2018 she had been taking caring for her sister who had undergone aortic stent grafting.  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  When I saw her, I recommended complete smoking cessation and suggested a pulmonary evaluation with Judith Stevens   She underwent a coronary CT, which showed a calcium score of 81 which was 9110percentile for age and sex matched control.  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  There is right dominance.  There was diffuse calcified plaque in the RCA and proximal LAD with narrowing of 25-50%.  7.  She underwent FFR analysis with both lesions being in the normal FFR range of  greater than 0.80.  She has significantly reduced her tobacco and now smokes very intermittently.   I saw her in May 04, 2017 currently at that time it appeared that she was on both low-dose amlodipine in addition to diltiazem.  I discontinued amlodipine and further titrated diltiazem.  Over the past year she has done fairly well.  She had seen Judith Russian, PA C in August 2019.  She had experienced occasional twinges of chest pain.  He had undergone cataract surgery.  I saw her in January 2020  in the office and  in April 2020 in a telemedicine visit.  At that time, she was tolerating olmesartan 20 mg with her diltiazem for blood pressure control.  There was concerns of GI issues and she is in need for a colonoscopy.  She had been evaluated by Judith Stevens and this had been deferred with the COVID-19 pandemic.    I last saw her in September 2020.  At that time she denied any chest pain PND orthopnea.  Her blood pressure had been stable typically running in the 120s to 140s at home.  Recently, her sister died with cardiogenic shock and sepsis.  She has seen Dr. Verlin Stevens for allergies.  During that evaluation, her blood pressure was elevated at 150/64.  She had been under increased stress particularly with her sister's death.  I recommended further titration of olmesartan to 30 mg she was continue to take diltiazem CD 300 mg daily.  I recommended that if her blood pressure consistently stayed above 373 systolically further titration to olmesartan 40 mg would be necessary.  I saw Judith Stevens in the office on April 14, 2019.  Over the last several months she had felt fairly well but for the past 6 to 8 weeks she has noticed more shortness of breath than she had experienced previously.  Approximately 3 weeks ago, she had an episode of chest tightness which resolved.  She denies any significant chest pain since that time and denies any nocturnal symptoms.  She has experienced shortness of breath with activity and has taken a terbutaline tablet which she had had and noted improvement.  She denies any PND orthopnea.  She denies any nocturnal chest tightness.  During her evaluation, her ECG showed sinus rhythm at 70 bpm with an isolated PVC and new inferior and anterolateral T wave inversion of 3 to 5 mm in leads II 3 and F, V4 through V6.  During that evaluation I discussed with her my concern for her change in ECG with new T wave inversion.  She told me she had not had an episode of chest pain for over 3 weeks.   However she was noticing shortness of breath with less activity.  I discussed options including admission to the hospital on that day versus adding additional medication to her regimen.  At the time she preferred that I do her cardiac catheterization.  I added isosorbide 30 mg daily to her medical regimen as well as metoprolol tartrate 12.5 mg twice daily I recommended reinstitution of aspirin which she had discontinued.  She continues to be on atorvastatin 80 mg daily, olmesartan 30 mg for blood pressure.  She denied any nocturnal symptoms.  I recommended that she return to the office today April 17, 2019 for reassessment of the effects of her medical therapy and repeat ECG.  Over the weekend, she actually has felt significantly better and denies any chest tightness or shortness of breath.  However, her EKG in the  office today now shows sinus bradycardia at 51 bpm with more pronounced T wave abnormality of approximately 10 mm in V3 through V6 in addition to 4 mm inferiorly.  As result, admission to the hospital was recommended and she was admitted with plans to institute heparinization and as long as she is stable plan cardiac catheterization tomorrow or later today if symptoms change.  She underwent successful cardiac catheterization on April 18, 2019 by Dr. Ellyn Hack.  She was found to have moderate mid LAD stenosis of 55%.  In some views this appeared greater.  FFR analysis was negative at 0.85.  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  Medical therapy was recommended.  She was seen in follow-up by Coletta Memos, MD in the office on April 28, 2019 and remained fairly stable.  Over the last 3 months, she has done well.  She specifically denies any episodes of chest pain PND orthopnea.  She has continued to be on diltiazem 300 mg daily at bedtime, isosorbide 30 mg daily in addition to olmesartan  currently at 30 mg as well as metoprolol tartrate 25 mg twice a day.  She is unaware of any tachypalpitations.  She is sleeping well.  She denies any chest tightness.  She continues to be on atorvastatin 80 mg and Zetia 10 mg for aggressive lipid management.  GERD is controlled with pantoprazole.  She presents for reevaluation.  Past Medical History:  Diagnosis Date  . Anxiety   . Aortic valve stenosis   . Arthritis   . Asthma   . CAD (coronary artery disease)    single vessel  . COPD (chronic obstructive pulmonary disease) ([place])   . GERD (gastroesophageal reflux disease)   . Headache   . History of stress test 10/2011   Resting images reveal a normal pattern of perfusion in all regions. The post stress myocardial perfusion images show a normal pattern of perfusion in all region. The post stress left ventricle is normal size. the rest left ventricle is normal size, there is no scintigraphic evidence of inductible myocardial ischemia.  Marland Kitchen Hx of echocardiogram 09/2009   EF >55% Normal Echo  . Hyperlipidemia   . Hypertension   . Migraines   . Pneumonia    hx of  . Pulmonary emboli ([place]) 5/12  . Shortness of breath dyspnea    exertion or eating something shes allergic too  . Tobacco abuse     Past Surgical History:  Procedure Laterality Date  . ADENOIDECTOMY    . BACK SURGERY  2012  . CARDIAC CATHETERIZATION  10/13/2007   60% narrowing twin LAD system  . DILATION AND CURETTAGE OF UTERUS    . FOOT SURGERY  2002,2013   right  . INTRAVASCULAR PRESSURE WIRE/FFR STUDY N/A 04/18/2019   Procedure: INTRAVASCULAR PRESSURE WIRE/FFR STUDY;  Surgeon: Leonie Man, MD;  Location: [facility];  Service: Cardiovascular;  Laterality: N/A;  . KNEE SURGERY  01/17/2007   right  . LEFT HEART CATH AND CORONARY ANGIOGRAPHY N/A 04/18/2019   Procedure: LEFT HEART CATH AND CORONARY ANGIOGRAPHY;  Surgeon: Leonie Man, MD;  Location: [facility];  Service: Cardiovascular;  Laterality:  N/A;  . SACROILIAC JOINT FUSION Right 03/28/2014   Procedure: [place];  Surgeon: Sinclair Ship, MD;  Location: [facility];  Service: Orthopedics;  Laterality: Right;  Right sided sacroliac joint fusion  . TONSILLECTOMY  Allergies  Allergen Reactions  . Cefzil [Cefprozil] Shortness Of Breath  . Keflex [Cephalexin] Shortness Of Breath and Rash  . Lactose Intolerance (Gi) Shortness Of Breath    ALL DAIRY PRODUCTS  . Levaquin [Levofloxacin] Shortness Of Breath and Rash  . Naproxen Shortness Of Breath    Shortness of breath Shortness of breath  . Oxycodone-Acetaminophen Nausea And Vomiting and Shortness Of Breath  . Peanuts [Peanut Oil] Shortness Of Breath  . Penicillins Shortness Of Breath  . Percocet [Oxycodone-Acetaminophen] Shortness Of Breath  . Red Dye Shortness Of Breath  . Sulfa Antibiotics Shortness Of Breath  . Sulfacetamide Sodium Shortness Of Breath  . Tetracycline Hcl Shortness Of Breath  . Tetracyclines & Related Shortness Of Breath  . Methocarbamol Nausea And Vomiting  . Prednisone Other (See Comments)    Rash  . Tomato Other (See Comments)    SOB  . Amoxicillin-Pot Clavulanate Rash  . Ibuprofen Rash  . Moxifloxacin Rash  . Penicillin G Rash  . Sulfamethoxazole-Trimethoprim Rash  . Tetracycline Rash  . Tramadol Rash    Current Outpatient Medications  Medication Sig Dispense Refill  . ALPRAZolam (XANAX) 1 MG tablet Take 1 mg by mouth 3 (three) times daily as needed for anxiety.     Marland Kitchen aspirin EC 81 MG tablet Take 81 mg by mouth daily.    Marland Kitchen atorvastatin (LIPITOR) 80 MG tablet Take 1 tablet (80 mg total) by mouth daily. 90 tablet 3  . budesonide (PULMICORT) 0.5 MG/2ML nebulizer solution USE AS DIRECTED FOR NASAL RINSE 120 mL 0  . butalbital-acetaminophen-caffeine (FIORICET) 50-325-40 MG tablet Take 1 tablet by mouth every 6 (six) hours as needed.    . Cholecalciferol (VITAMIN D3) 2000 UNITS TABS Take 2,000 Units by mouth daily.     Marland Kitchen  diltiazem (CARDIZEM CD) 300 MG 24 hr capsule Take 1 capsule (300 mg total) by mouth at bedtime. 90 capsule 3  . escitalopram (LEXAPRO) 20 MG tablet Take 1 tablet (20 mg total) by mouth daily. 90 tablet 0  . ezetimibe (ZETIA) 10 MG tablet Take 1 tablet (10 mg total) by mouth daily. (Patient taking differently: Take 10 mg by mouth at bedtime. ) 90 tablet 3  . Fluticasone-Salmeterol (ADVAIR DISKUS) 250-50 MCG/DOSE AEPB Inhale 1 puff into the lungs 2 (two) times daily.    . metoprolol tartrate (LOPRESSOR) 25 MG tablet Take 1 tablet (25 mg total) by mouth 2 (two) times daily. 90 tablet 3  . Multiple Vitamin (MULTI-VITAMIN DAILY PO) Take 1 tablet by mouth daily.    . nitroGLYCERIN (NITROSTAT) 0.4 MG SL tablet Place 1 tablet (0.4 mg total) under the tongue every 5 (five) minutes as needed for chest pain. Needs appointment for future refills 25 tablet 0  . olmesartan (BENICAR) 40 MG tablet Take 1 tablet (40 mg total) by mouth daily. Take 30-75m as needed for BP 90 tablet 3  . ondansetron (ZOFRAN) 4 MG tablet Take 4 mg by mouth every 8 (eight) hours as needed for nausea.    . pantoprazole (PROTONIX) 40 MG tablet Take 40 mg by mouth 2 (two) times daily.     . terbutaline (BRETHINE) 5 MG tablet Take 5 mg by mouth every 6 (six) hours as needed (asthma).     . VENTOLIN HFA 108 (90 Base) MCG/ACT inhaler Inhale 2 puffs into the lungs every 6 (six) hours as needed for wheezing.   11  . isosorbide mononitrate (IMDUR) 30 MG 24 hr tablet Take 1 tablet (30 mg total) by mouth  daily. 30 tablet 3   No current facility-administered medications for this visit.    Social History   Socioeconomic History  . Marital status: Widowed    Spouse name: Not on file  . Number of children: Not on file  . Years of education: Not on file  . Highest education level: Not on file  Occupational History  . Not on file  Tobacco Use  . Smoking status: Former Smoker    Packs/day: 0.10    Types: Cigarettes    Start date: 01/30/2014      Quit date: 04/15/2019    Years since quitting: 0.2  . Smokeless tobacco: Former Systems developer    Quit date: 01/29/2014  Substance and Sexual Activity  . Alcohol use: Yes    Alcohol/week: 4.0 standard drinks    Types: 2 Glasses of wine, 2 Cans of beer per week  . Drug use: No  . Sexual activity: Not on file  Other Topics Concern  . Not on file  Social History Narrative  . Not on file   Social Determinants of Health   Financial Resource Strain:   . Difficulty of Paying Living Expenses:   Food Insecurity:   . Worried About Charity fundraiser in the Last Year:   . Arboriculturist in the Last Year:   Transportation Needs:   . Film/video editor (Medical):   Marland Kitchen Lack of Transportation (Non-Medical):   Physical Activity:   . Days of Exercise per Week:   . Minutes of Exercise per Session:   Stress:   . Feeling of Stress :   Social Connections:   . Frequency of Communication with Friends and Family:   . Frequency of Social Gatherings with Friends and Family:   . Attends Religious Services:   . Active Member of Clubs or Organizations:   . Attends Archivist Meetings:   Marland Kitchen Marital Status:   Intimate Partner Violence:   . Fear of Current or Ex-Partner:   . Emotionally Abused:   Marland Kitchen Physically Abused:   . Sexually Abused:     Family History  Problem Relation Age of Onset  . Heart attack Mother   . Diabetes Mother   . Asthma Mother   . Heart attack Father   . Diabetes Sister   . Hyperlipidemia Sister   . Allergic rhinitis Neg Hx   . Angioedema Neg Hx   . Eczema Neg Hx   . Immunodeficiency Neg Hx   . Urticaria Neg Hx    Socially, she is widowed. She has 2 children 5 grandchildren. She does not exercise. She quit smoking 2 years ago. resumed on 30-Aug-2022 after her dog died.  She is committed to completely discontinue tobacco.   ROS General: Negative; No fevers, chills, or night sweats;  HEENT: Negative; No changes in vision or hearing, sinus congestion, difficulty  swallowing Pulmonary: Positive for asthma Cardiovascular: Negative; No chest pain, presyncope, syncope, palpitations GI: positive for GERD; No nausea, vomiting, diarrhea, or abdominal pain GU: Negative; No dysuria, hematuria, or difficulty voiding Musculoskeletal: She tolerated her hip surgery well. Hematologic/Oncology: Negative; no easy bruising, bleeding Endocrine: Negative; no heat/cold intolerance; no diabetes Neuro: Negative; no changes in balance, headaches Skin: Negative; No rashes or skin lesions Psychiatric: Negative; No behavioral problems, depression Sleep: Negative; No snoring, daytime sleepiness, hypersomnolence, bruxism, restless legs, hypnogognic hallucinations, no cataplexy Other comprehensive 14 point system review is negative.  Nutritionally she cannot have dairy products due to significant milk allergy.  PE BP (!) 141/56   Pulse (!) 51   Ht '5\' 4"'  (1.626 m)   Wt 133 lb (60.3 kg)   LMP  (LMP Unknown)   SpO2 97%   BMI 22.83 kg/m    Repeat blood pressure by me 138/60.    Wt Readings from Last 3 Encounters:  07/14/19 133 lb (60.3 kg)  04/28/19 132 lb 3.2 oz (60 kg)  04/18/19 130 lb 1.6 oz (59 kg)   General: Alert, oriented, no distress.  Skin: normal turgor, no rashes, warm and dry HEENT: Normocephalic, atraumatic. Pupils equal round and reactive to light; sclera anicteric; extraocular muscles intact;  Nose without nasal septal hypertrophy Mouth/Parynx benign; Mallinpatti scale 3 Neck: No JVD, no carotid bruits; normal carotid upstroke Lungs: clear to ausculatation and percussion; no wheezing or rales Chest wall: without tenderness to palpitation Heart: PMI not displaced, RRR, s1 s2 normal, 1/6 systolic murmur, no diastolic murmur, no rubs, gallops, thrills, or heaves Abdomen: soft, nontender; no hepatosplenomehaly, BS+; abdominal aorta nontender and not dilated by palpation. Back: no CVA tenderness Pulses 2+ Musculoskeletal: full range of motion, normal  strength, no joint deformities Extremities: no clubbing cyanosis or edema, Homan's sign negative  Neurologic: grossly nonfocal; Cranial nerves grossly wnl Psychologic: Normal mood and affect   ECG (independently read by me): Sinus bradycardia 51 bpm.  Marked improvement in prior T wave abnormality with minimal nonspecific ST change in lead III.     April 17, 2019 ECG (independently read by me): Sinus bradycardia 51 bpm.  LVH by voltage criteria.  More deeply inverted inferolateral T wave inversion 8 to 10 mm in the anterolateral leads in 2 to 4 mm inferiorly  April 14, 2019 ECG (independently read by me): Normal sinus rhythm at 70 bpm.  New inferior and anterolateral T wave inversion.  PR interval 170 ms, QTc interval 444 ms; an isolated PVC  September 2020 ECG (independently read by me): Sinus bradycardia at 58 bpm.  Nonspecific ST changes.  Poor R wave progression V1 through V3  January 2020 ECG (independently read by me): Normal sinus rhythm at 61 bpm.  No ectopy.  Specific ST change in lead III.  February 2019 ECG (independently read by me): Normal sinus rhythm at 72 bpm.  Nonspecific ST changes inferiorly.  QTc interval 451 ms.  June 2018 ECG (independently read by me): Sinus bradycardia 57 bpm.  No significant ST-T changes.  Normal intervals.  January 2018 ECG (independently read by me): Normal sinus rhythm at 69 bpm.  No ectopy.  Normal intervals.  November 2016 ECG (independently read by me): Normal sinus rhythm at 60 bpm.  No significant ST-T changes.  Normal intervals.  November 2015 ECG (independently read by me): Normal sinus rhythm at 72 bpm.  Normal intervals.  No significant ST-T changes.  October 2014 ECG: Sinus rhythm with occasional isolated PVC. Heart rate 68 beats per minute. QTc interval 440 ms.  LAB: BMP Latest Ref Rng & Units 04/18/2019 04/17/2019 12/09/2018  Glucose 70 - 99 mg/dL 101(H) 156(H) 93  BUN 8 - 23 mg/dL '15 14 16  ' Creatinine 0.44 - 1.00 mg/dL 0.58  0.62 0.61  BUN/Creat Ratio 12 - 28 - - 26  Sodium 135 - 145 mmol/L 140 141 138  Potassium 3.5 - 5.1 mmol/L 3.8 4.5 4.7  Chloride 98 - 111 mmol/L 106 103 99  CO2 22 - 32 mmol/L '25 30 27  ' Calcium 8.9 - 10.3 mg/dL 8.6(L) 9.2 9.6   Hepatic Function Latest Ref Rng &  Units 12/09/2018 11/03/2017 02/08/2017  Total Protein 6.0 - 8.5 g/dL 6.7 6.1 6.6  Albumin 3.7 - 4.7 g/dL 4.9(H) 4.5 4.7  AST 0 - 40 IU/L '21 16 19  ' ALT 0 - 32 IU/L '26 22 18  ' Alk Phosphatase 39 - 117 IU/L 63 67 78  Total Bilirubin 0.0 - 1.2 mg/dL 0.3 0.2 0.3   CBC Latest Ref Rng & Units 04/18/2019 04/17/2019 11/03/2017  WBC 4.0 - 10.5 K/uL 4.6 4.8 4.5  Hemoglobin 12.0 - 15.0 g/dL 11.3(L) 11.9(L) 12.0  Hematocrit 36.0 - 46.0 % 33.4(L) 35.8(L) 36.5  Platelets 150 - 400 K/uL 123(L) 132(L) 197   Lab Results  Component Value Date   MCV 92.3 04/18/2019   MCV 93.7 04/17/2019   MCV 92 11/03/2017   Lab Results  Component Value Date   TSH 2.150 11/03/2017   No results found for: HGBA1C   Lipid Panel     Component Value Date/Time   CHOL 159 12/09/2018 0931   TRIG 46 12/09/2018 0931   HDL 81 12/09/2018 0931   CHOLHDL 2.0 12/09/2018 0931   CHOLHDL 2.0 04/27/2016 0923   VLDL 10 04/27/2016 0923   LDLCALC 68 12/09/2018 0931    RADIOLOGY: No results found.  IMPRESSION:  1. Coronary artery disease involving native coronary artery of native heart with other form of angina pectoris ([place])   2. Essential hypertension   3. Hyperlipidemia LDL goal <70   4. Prior tobacco abuse   5. Mild asthma without complication, unspecified whether persistent   6. Anxiety and depression   7. Gastroesophageal reflux disease without esophagitis     ASSESSMENT AND PLAN: Ms. Blacksher is a 79 year-old female who had smoked for over 50 years and quit smoking 2 years ago.  Unfortunately, she was under significant stress with the  death of her dog and resumed smoking in May 2018 continued to smoke intermittently but fortunately again quit smoking in  March 2020.  Remotely she was found to have she has CAD with a 60% ostial LAD stenosis dating back to 2001 and which again was noted on her repeat catheterization by me in 2009.  We have been aggressively treating her lipids in attempt to induce plaque regression and she has tolerated Lipitor 80 mg in addition to Zetia 10 mg.  Laboratory on December 09, 2018 showed a total cholesterol 159, triglycerides 46, HDL 81, and LDL cholesterol at 68.  A previous coronary CT angiography in December 2018 did not demonstrate significant progression of disease and showed 25-50% stenoses in the LAD and RCA; FFR was negative.  I had seen her in early 2020 and had instituted olmesartan due to continued blood pressure elevation.  She had experienced increased stress at the time of her last evaluation in 01-14-2019 due to her sister's death.  At that time her blood pressure was elevated and I further titrated olmesartan.  Over the past 6 to 8 weeks she has noticed more episodes of shortness of breath typically with activity.  She experienced an episode of chest pain approximately 3 weeks ago but denies any recurrent significant chest pain since that time.  However times there is a vague chest pressure sensation which resolves spontaneously.  She denies any rest symptoms.  When I saw her on April 14, 2019 her ECG showed new inferolateral T wave inversion worrisome for ischemia.  During that evaluation I discussed with her potential admission to the hospital but she preferred not to go to the hospital if at  all possible with plans for elective catheterization.  I added isosorbide mononitrate 30 mg in addition to metoprolol tartrate 12.5 mg to her medical regimen.  When she was seen in follow-up on April 16, 2018 she denied any recurrent chest pain but her ECG showed progressively more abnormal anterolateral T wave inversion leading to her admission and ultimate cardiac catheterization.  I reviewed her catheterization findings  with her in detail.  Her procedure was done by Dr. Ellyn Hack.  She appeared to have some bifurcation narrowing in a twin like LAD vessel with narrowing of at least 36 or greater percent stenosis in the more inferior branch.  FFR analysis however was negative.  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  She has been without recurrent chest pain symptomatology on her current medical regimen of diltiazem 300 mg in addition to isosorbide 30 mg as well as metoprolol tartrate 25 mg twice a day.  Her EKG today shows resolution of her previous markedly abnormal T wave abnormalities.  If there is a component of potential microvascular angina she benefit from possible ranolazine therapy I will not start this presently.  With her blood pressure in the upper 888 systolically, I have recommended titration of olmesartan from 30 mg to 40 mg daily.  She is now off tobacco.  She continues to be on Pulmicort denies recent wheezing.  She is on Lexapro for anxiety/depression.  GERD is controlled with pantoprazole.  As long as she remains stable, I will see her in 6 months for reevaluation or sooner as needed.   Troy Sine, MD, [facility]  07/16/2019 9:09 AM

## 2019-07-14 NOTE — Patient Instructions (Signed)
Medication Instructions:  INCREASE OLMESARTAN TO 40MG  DAILY *If you need a refill on your cardiac medications before your next appointment, please call your pharmacy*    Follow-Up: At [facility], you and your health needs are our priority.  As part of our continuing mission to provide you with exceptional heart care, we have created designated Provider Care Teams.  These Care Teams include your primary Cardiologist (physician) and Advanced Practice Providers (APPs -  Physician Assistants and Nurse Practitioners) who all work together to provide you with the care you need, when you need it.  We recommend signing up for the patient portal called "MyChart".  Sign up information is provided on this After Visit Summary.  MyChart is used to connect with patients for Virtual Visits (Telemedicine).  Patients are able to view lab/test results, encounter notes, upcoming appointments, etc.  Non-urgent messages can be sent to your provider as well.   To learn more about what you can do with MyChart, go to [facility].    Your next appointment:   6 month(s)  The format for your next appointment:   In Person  Provider:   [email or web address], MD

## 2019-07-16 ENCOUNTER — Encounter: Payer: Self-pay | Admitting: Cardiovascular Disease

## 2019-07-19 ENCOUNTER — Other Ambulatory Visit: Payer: Self-pay | Admitting: Cardiovascular Disease

## 2019-07-26 ENCOUNTER — Other Ambulatory Visit: Payer: Self-pay | Admitting: Cardiovascular Disease

## 2019-07-26 ENCOUNTER — Telehealth: Payer: Self-pay | Admitting: Cardiovascular Disease

## 2019-07-26 MED ORDER — ISOSORBIDE MONONITRATE ER 30 MG PO TB24: 30.0000 mg | ORAL_TABLET | Freq: Every day | ORAL | 3 refills | Status: DC

## 2019-07-26 MED ORDER — OLMESARTAN MEDOXOMIL 40 MG PO TABS: 40.0000 mg | ORAL_TABLET | Freq: Every day | ORAL | 3 refills | Status: DC

## 2019-07-26 MED ORDER — METOPROLOL TARTRATE 25 MG PO TABS: 25.0000 mg | ORAL_TABLET | Freq: Two times a day (BID) | ORAL | 3 refills | Status: DC

## 2019-07-26 NOTE — Telephone Encounter (Signed)
Pt calling stating her pharmacy had been attempting to get in touch with our office for her olmesartan prescription and they needed clarification on the pt instructions. Pt is to be taking 40mg  daily Updated prescription with refills sent to the pharmacy at [facility]. Pt verbalized understanding with no other questions at this time.

## 2019-07-26 NOTE — Telephone Encounter (Signed)
New Message  Got a call from the pt stating that the pharmacy has been trying to get in touch with the nurse 3 separates times to get authorization to get the medication refilled. Needs to speak with pharmacy to confirm medication so it can be filled.

## 2019-08-17 ENCOUNTER — Other Ambulatory Visit: Payer: Self-pay | Admitting: Cardiovascular Disease

## 2019-10-30 ENCOUNTER — Encounter: Payer: Self-pay | Admitting: Cardiovascular Disease

## 2019-10-30 NOTE — Telephone Encounter (Signed)
Error

## 2019-11-07 NOTE — Progress Notes (Signed)
Cardiology Clinic Note   Patient Name: Judith Stevens Date of Encounter: 11/08/2019  Primary Care Provider:  Richmond Stevens., PA-C Primary Cardiologist:  Judith Guadalajara, MD  Patient Profile    Judith Stevens 79 year old female presents today for follow-up of her coronary artery disease/unstable angina status post LHC (moderate LAD-second diagonal bifurcation lesion, medical management)  Past Medical History    Past Medical History:  Diagnosis Date   Anxiety    Aortic valve stenosis    Arthritis    Asthma    CAD (coronary artery disease)    single vessel   COPD (chronic obstructive pulmonary disease) (HCC)    GERD (gastroesophageal reflux disease)    Headache    History of stress test 10/2011   Resting images reveal a normal pattern of perfusion in all regions. The post stress myocardial perfusion images show a normal pattern of perfusion in all region. The post stress left ventricle is normal size. the rest left ventricle is normal size, there is no scintigraphic evidence of inductible myocardial ischemia.   Hx of echocardiogram 09/2009   EF >55% Normal Echo   Hyperlipidemia    Hypertension    Migraines    Pneumonia    hx of   Pulmonary emboli (HCC) 5/12   Shortness of breath dyspnea    exertion or eating something shes allergic too   Tobacco abuse    Past Surgical History:  Procedure Laterality Date   ADENOIDECTOMY     BACK SURGERY  2012   CARDIAC CATHETERIZATION  10/13/2007   60% narrowing twin LAD system   DILATION AND CURETTAGE OF UTERUS     FOOT SURGERY  2002,2013   right   INTRAVASCULAR PRESSURE WIRE/FFR STUDY N/A 04/18/2019   Procedure: INTRAVASCULAR PRESSURE WIRE/FFR STUDY;  Surgeon: Judith Lex, MD;  Location: [facility] INVASIVE CV LAB;  Service: Cardiovascular;  Laterality: N/A;   KNEE SURGERY  01/17/2007   right   LEFT HEART CATH AND CORONARY ANGIOGRAPHY N/A 04/18/2019   Procedure: LEFT HEART CATH AND CORONARY  ANGIOGRAPHY;  Surgeon: Judith Lex, MD;  Location: [facility] INVASIVE CV LAB;  Service: Cardiovascular;  Laterality: N/A;   SACROILIAC JOINT FUSION Right 03/28/2014   Procedure: SACROILIAC JOINT FUSION;  Surgeon: Judith Hero, MD;  Location: [facility] OR;  Service: Orthopedics;  Laterality: Right;  Right sided sacroliac joint fusion   TONSILLECTOMY      Allergies  Allergies  Allergen Reactions   Cefzil [Cefprozil] Shortness Of Breath   Keflex [Cephalexin] Shortness Of Breath and Rash   Lactose Intolerance (Gi) Shortness Of Breath    ALL DAIRY PRODUCTS   Levaquin [Levofloxacin] Shortness Of Breath and Rash   Naproxen Shortness Of Breath    Shortness of breath Shortness of breath   Oxycodone-Acetaminophen Nausea And Vomiting and Shortness Of Breath   Peanuts [Peanut Oil] Shortness Of Breath   Penicillins Shortness Of Breath   Percocet [Oxycodone-Acetaminophen] Shortness Of Breath   Red Dye Shortness Of Breath   Sulfa Antibiotics Shortness Of Breath   Sulfacetamide Sodium Shortness Of Breath   Tetracycline Hcl Shortness Of Breath   Tetracyclines & Related Shortness Of Breath   Methocarbamol Nausea And Vomiting   Prednisone Other (See Comments)    Rash   Tomato Other (See Comments)    SOB   Amoxicillin-Pot Clavulanate Rash   Ibuprofen Rash   Moxifloxacin Rash   Penicillin G Rash   Sulfamethoxazole-Trimethoprim Rash   Tetracycline Rash   Tramadol Rash  History of Present Illness    [name] is a past medical history of coronary artery disease (60% stenosis in ostial LAD that dates back to 2001), hypertension, hyperlipidemia, and asthma.  She underwent cardiac catheterization in 2001 in [place] that showed a 60% ostial narrowing of her LAD.  She had a repeat cardiac catheterization by Dr. Tresa Endo in 2019 showed no significant change.  A nuclear stress test August 2013 showed no new changes from 2 years prior and showed normal perfusion.  She  underwent right hip surgery in 2015.  She had a 2-year follow-up nuclear stress test 03/13/2014 preoperatively.  This showed normal perfusion without scar or ischemia.  She tolerated her hip surgery well.  An echocardiogram 02/18/2015 showed an EF of 55-60%, moderate thickening and calcification of a trileaflet aortic valve, which was consistent with sclerosis and no stenosis.  Mitral annular calcification was also present.  She was seen by Dr. Tresa Endo 01/2017 after she had been taking care of her sister who had undergone aortic stent grafting.  She was seen by pulmonary and underwent a coronary CT which showed a calcium score of 81 which was 93% for her age and gender.  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  There was diffuse calcified plaque in the RCA and proximal LAD narrowing of 25-50%.  She underwent FFR analysis with both lesions which were in the normal FFR range of greater than 0.8.  She was seen by Dr. Tresa Endo in the office 04/12/2019.  During that time she stated over the last several months she had felt fairly well but in the past 6-8 weeks she has been more short of breath.  3 weeks prior she has had an episode of chest tightness which resolved.  She denied any significant chest discomfort since that time and also denied nocturnal symptoms.  She had experienced shortness of breath with activity and was taking terbutaline without improvement of her symptoms.  She denied PND and orthopnea at that time.  Her EKG showed sinus rhythm 70 bpm with isolated PVCs and new inferior anterior T wave inversion 3-5 mm in leads II,III, aVF, and V4-V6.  A cardiac catheterization was recommended.  She underwent cardiac catheterization 04/18/2019 which showed a moderate LAD second diagonal bifurcation lesion.  Continuation of medical management was recommended.  She presented to the clinic 04/28/2019 and stated she was feeling somewhat  better since her cardiac catheterization.  She continued to have some shortness of breath but stated that when she took her inhaler her terbutaline and Xanax she felt much better.  She was ready to increase her physical activity and stated that she had an exercise bike that she would like to use.  Given that she was feeling somewhat better  I did not change any medications at the time.  She was concerned about aspirin and risk for bleeding. I  instructed her to inspect her prescription and make sure that it was enteric-coated.  Finally she stated that she did not appreciate comments made in the Cath Lab about her smoking and the contribution to her vascular disease by Dr. Herbie Baltimore.  I  gave her the salty 6 sheet, a blood pressure log, and  instructed her to increase her physical activity slowly.  Follow-up with Dr. Tresa Endo in 3 months.  She was seen by Dr. Tresa Endo 07/14/2019.  She was doing well at that time.  She denied chest pain, orthopnea, and  PND.  She denied awareness of palpitations.  She was sleeping well.  Her EKG showed resolution of T wave abnormalities.  Her olmesartan was increased to 40 mg daily due to blood pressures in the mid 130 range.  She presents to the clinic today for follow-up evaluation and states she feels well.  She indicates that she is having more shortness of breath than normal over the last month several weeks.  She states that she has also had one episode of chest pain that started in her left chest and moved to her right chest.  The pain lasted 5 minutes  was not associated with diaphoresis.  She did not take any nitroglycerin.  She has been compliant with her medications.  She was reassured that this is not felt to be cardiac in nature.  Given her smoking history, I will refer her to pulmonology for PFTs.  She also requests a handicap placard due to her decreased ability to walk related to her breathing problems.  I will have her increase her physical activity as tolerated, maintain  low-salt diet, and follow-up with Dr. Tresa Endo as scheduled.  She denies chest pain, increased shortness of breath, lower extremity edema, fatigue, melena, hematuria, hemoptysis, diaphoresis, weakness, presyncope, syncope, orthopnea, and PND.  Home Medications    Prior to Admission medications   Medication Sig Start Date End Date Taking? Authorizing Provider  ALPRAZolam Prudy Feeler) 1 MG tablet Take 1 mg by mouth 3 (three) times daily as needed for anxiety.  04/11/18   [provider]  aspirin EC 81 MG tablet Take 81 mg by mouth daily.    [provider]  atorvastatin (LIPITOR) 80 MG tablet Take 1 tablet (80 mg total) by mouth daily. 05/05/19   Lennette Bihari, MD  budesonide (PULMICORT) 0.5 MG/2ML nebulizer solution USE AS DIRECTED FOR NASAL RINSE 02/04/17   Bobbitt, Heywood Iles, MD  butalbital-acetaminophen-caffeine (FIORICET) 50-325-40 MG tablet Take 1 tablet by mouth every 6 (six) hours as needed. 03/22/19   [provider]  Cholecalciferol (VITAMIN D3) 2000 UNITS TABS Take 2,000 Units by mouth daily.     [provider]  diltiazem (CARDIZEM CD) 300 MG 24 hr capsule TAKE ONE CAPSULE BY MOUTH EVERY DAY AT BEDTIME 07/20/19   Lennette Bihari, MD  escitalopram (LEXAPRO) 20 MG tablet Take 1 tablet (20 mg total) by mouth daily. 04/13/13   Lennette Bihari, MD  ezetimibe (ZETIA) 10 MG tablet Take 1 tablet (10 mg total) by mouth at bedtime. 07/26/19   Lennette Bihari, MD  Fluticasone-Salmeterol (ADVAIR DISKUS) 250-50 MCG/DOSE AEPB Inhale 1 puff into the lungs 2 (two) times daily. 03/11/16   [provider]  isosorbide mononitrate (IMDUR) 30 MG 24 hr tablet TAKE 1 TABLET BY MOUTH EVERY DAY 08/22/19   Lennette Bihari, MD  metoprolol tartrate (LOPRESSOR) 25 MG tablet Take 1 tablet (25 mg total) by mouth 2 (two) times daily. 07/26/19 01/22/20  Lennette Bihari, MD  Multiple Vitamin (MULTI-VITAMIN DAILY PO) Take 1 tablet by mouth daily.    [provider]    nitroGLYCERIN (NITROSTAT) 0.4 MG SL tablet Place 1 tablet (0.4 mg total) under the tongue every 5 (five) minutes as needed for chest pain. Needs appointment for future refills 04/26/18   Lennette Bihari, MD  olmesartan (BENICAR) 40 MG tablet Take 1 tablet (40 mg total) by mouth daily. 07/26/19   Lennette Bihari, MD  ondansetron (ZOFRAN) 4 MG tablet Take 4 mg by mouth every 8 (  eight) hours as needed for nausea.    [provider]  pantoprazole (PROTONIX) 40 MG tablet Take 40 mg by mouth 2 (two) times daily.  09/20/17   [provider]  terbutaline (BRETHINE) 5 MG tablet Take 5 mg by mouth every 6 (six) hours as needed (asthma).     [provider]  VENTOLIN HFA 108 (90 Base) MCG/ACT inhaler Inhale 2 puffs into the lungs every 6 (six) hours as needed for wheezing.  12/20/15   [provider]    Family History    Family History  Problem Relation Age of Onset   Heart attack Mother    Diabetes Mother    Asthma Mother    Heart attack Father    Diabetes Sister    Hyperlipidemia Sister    Allergic rhinitis Neg Hx    Angioedema Neg Hx    Eczema Neg Hx    Immunodeficiency Neg Hx    Urticaria Neg Hx    She indicated that her mother is deceased. She indicated that her father is deceased. She indicated that only one of her three sisters is alive. She indicated that her maternal grandmother is deceased. She indicated that her maternal grandfather is deceased. She indicated that her paternal grandmother is deceased. She indicated that her paternal grandfather is deceased. She indicated that the status of her neg hx is unknown.  Social History    Social History   Socioeconomic History   Marital status: Widowed    Spouse name: Not on file   Number of children: Not on file   Years of education: Not on file   Highest education level: Not on file  Occupational History   Not on file  Tobacco Use   Smoking status: Former Smoker    Packs/day: 0.10     Types: Cigarettes    Start date: 01/30/2014    Quit date: 04/15/2019    Years since quitting: 0.5   Smokeless tobacco: Former Neurosurgeon    Quit date: 01/29/2014  Vaping Use   Vaping Use: Never used  Substance and Sexual Activity   Alcohol use: Yes    Alcohol/week: 4.0 standard drinks    Types: 2 Glasses of wine, 2 Cans of beer per week   Drug use: No   Sexual activity: Not on file  Other Topics Concern   Not on file  Social History Narrative   Not on file   Social Determinants of Health   Financial Resource Strain:    Difficulty of Paying Living Expenses:   Food Insecurity:    Worried About Programme researcher, broadcasting/film/video in the Last Year:    Barista in the Last Year:   Transportation Needs:    Freight forwarder (Medical):    Lack of Transportation (Non-Medical):   Physical Activity:    Days of Exercise per Week:    Minutes of Exercise per Session:   Stress:    Feeling of Stress :   Social Connections:    Frequency of Communication with Friends and Family:    Frequency of Social Gatherings with Friends and Family:    Attends Religious Services:    Active Member of Clubs or Organizations:    Attends Engineer, structural:    Marital Status:   Intimate Partner Violence:    Fear of Current or Ex-Partner:    Emotionally Abused:    Physically Abused:    Sexually Abused:      Review of Systems  General:  No chills, fever, night sweats or weight changes.  Cardiovascular:  No chest pain, dyspnea on exertion, edema, orthopnea, palpitations, paroxysmal nocturnal dyspnea. Dermatological: No rash, lesions/masses Respiratory: No cough, dyspnea Urologic: No hematuria, dysuria Abdominal:   No nausea, vomiting, diarrhea, bright red blood per rectum, melena, or hematemesis Neurologic:  No visual changes, wkns, changes in mental status. All other systems reviewed and are otherwise negative except as noted above.  Physical Exam    VS:  BP  136/70    Pulse 61    Ht 5\' 4"  (1.626 m)    Wt 133 lb (60.3 kg)    LMP  (LMP Unknown)    SpO2 99%    BMI 22.83 kg/m  , BMI Body mass index is 22.83 kg/m. GEN: Well nourished, well developed, in no acute distress. HEENT: normal. Neck: Supple, no JVD, carotid bruits, or masses. Cardiac: RRR, no murmurs, rubs, or gallops. No clubbing, cyanosis, edema.  Radials/DP/PT 2+ and equal bilaterally.  Respiratory:  Respirations regular and unlabored, clear to auscultation bilaterally.  +Dyspnea on exertion/shortness of breath GI: Soft, nontender, nondistended, BS + x 4. MS: no deformity or atrophy. Skin: warm and dry, no rash. Neuro:  Strength and sensation are intact. Psych: Normal affect.  Accessory Clinical Findings    Recent Labs: 12/09/2018: ALT 26 04/18/2019: BUN 15; Creatinine, Ser 0.58; Hemoglobin 11.3; Platelets 123; Potassium 3.8; Sodium 140   Recent Lipid Panel    Component Value Date/Time   CHOL 159 12/09/2018 0931   TRIG 46 12/09/2018 0931   HDL 81 12/09/2018 0931   CHOLHDL 2.0 12/09/2018 0931   CHOLHDL 2.0 04/27/2016 0923   VLDL 10 04/27/2016 0923   LDLCALC 68 12/09/2018 0931    ECG personally reviewed by me today-none today.  EKG 07/14/2019 Sinus bradycardia 51 bpm no ST or T wave deviation   EKG 04/17/2019  Sinus bradycardia 51 bpm LVH by voltage criteria.  More deeply inverted inferiolateral T wave inversion 8-10 mm in the anteriolateral leads and 2-4 mm inferiorly  EKG 04/14/2019 Normal sinus rhythm 70 bpmNew inferior and anterolateral T wave inversion  EKG 12/18/2018 Sinus bradycardia 58 bpm nonspecific ST changes  TTE: 04/18/19  IMPRESSIONS   1. Left ventricular ejection fraction, by visual estimation, is 65 to 70%. The left ventricle has hyperdynamic function. There is no left ventricular hypertrophy. 2. Left ventricular diastolic parameters are indeterminate. 3. Mildly dilated left ventricular internal cavity size. 4. The left ventricle has no  regional wall motion abnormalities. 5. Global right ventricle has normal systolic function.The right ventricular size is normal. No increase in right ventricular wall thickness. 6. Left atrial size was mildly dilated. 7. Right atrial size was normal. 8. Moderate mitral annular calcification. 9. The mitral valve is normal in structure. Trivial mitral valve regurgitation. 10. The tricuspid valve is normal in structure. 11. The tricuspid valve is normal in structure. Tricuspid valve regurgitation is trivial. 12. The aortic valve has an indeterminant number of cusps. Aortic valve regurgitation is not visualized. 13. The pulmonic valve was grossly normal. Pulmonic valve regurgitation is not visualized. 14. The ascending aorta was not well visualized. 15. The inferior vena cava is normal in size with greater than 50% respiratory variability, suggesting right atrial pressure of 3 mmHg  Cardiac catheterization 04/17/2018  There is hyperdynamic left ventricular systolic function. The left ventricular ejection fraction is greater than 65% by visual estimate.  LV end diastolic pressure is normal.  Mid LAD lesion is 55% stenosed.-NOT SIGNIFICANT, FFR 0.85  Widely patent anomalous circumflex.  Widely patent dominant RCA  SUMMARY  Moderate LAD-2nd Diag bifurcation lesion in the following portion of the LAD-FFR negative.  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  Borderline LVEDP despite systemic hypertension.   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  Considered Ranexa, however has a flag for red dye allergy (would need to discuss with pharmacy)   Bryan Lemma, M.D., M.S. Interventional Cardiologist    Diagnostic Dominance: Right    Assessment & Plan   1.  Coronary artery disease-no chest pain today.  Status post [facility] 04/18/2019.  Medical management recommended.  States a month and a half ago she had one episode of chest pain that lasted for about 5 minutes.  Pain started on her left chest and moved to her right chest.  It was relieved with rest.  Reassured that this was  not cardiac in nature. Continue aspirin, atorvastatin, diltiazem, Zetia, Imdur, metoprolol, sublingual nitroglycerin, olmesartan Heart healthy low-sodium diet-salty 6 given Increase physical activity as tolerated Order BMP and CBC prior to next visit.  Essential hypertension-BP today 136/70.  Well-controlled at home.  Continues to increase with physical activity Continue diltiazem, Imdur, metoprolol, olmesartan Heart healthy low-sodium diet-salty 6 given Increase physical activity as tolerated  Hyperlipidemia-12/09/2018: Cholesterol, Total 159; HDL 81; LDL Chol Calc (NIH) 68; Triglycerides 46 Continue atorvastatin, Zetia Heart healthy low-sodium high-fiber diet  Dyspnea on exertion-presents today with progressive DOE.  She does have some relief with her albuterol, Advair, and terbutaline. Continue Brethine, Advair Heart healthy low-sodium diet-salty 6 given Increase physical activity as tolerated Refer to pulmonology for PFTs  Disposition: Follow-up with Dr. Tresa Endo in 3 months.   Thomasene Ripple. Wilkin Lippy NP-C    11/08/2019, 10:54 AM [facility] [address] Office [phone] Fax [phone]  Notice: This dictation was prepared with  Dragon dictation along with smaller phrase technology. Any transcriptional errors that result from this process are unintentional and may not be corrected upon review.

## 2019-11-08 ENCOUNTER — Ambulatory Visit (INDEPENDENT_AMBULATORY_CARE_PROVIDER_SITE_OTHER): Payer: Medicare Other | Admitting: General Practice

## 2019-11-08 ENCOUNTER — Other Ambulatory Visit: Payer: Self-pay

## 2019-11-08 ENCOUNTER — Encounter: Payer: Self-pay | Admitting: General Practice

## 2019-11-08 VITALS — BP 136/70 | HR 61 | Ht 64.0 in | Wt 133.0 lb

## 2019-11-08 DIAGNOSIS — R0600 Dyspnea, unspecified: Secondary | ICD-10-CM

## 2019-11-08 DIAGNOSIS — E785 Hyperlipidemia, unspecified: Secondary | ICD-10-CM | POA: Diagnosis not present

## 2019-11-08 DIAGNOSIS — I10 Essential (primary) hypertension: Secondary | ICD-10-CM | POA: Diagnosis not present

## 2019-11-08 DIAGNOSIS — I25118 Atherosclerotic heart disease of native coronary artery with other forms of angina pectoris: Secondary | ICD-10-CM

## 2019-11-08 DIAGNOSIS — R0609 Other forms of dyspnea: Secondary | ICD-10-CM

## 2019-11-08 NOTE — Patient Instructions (Addendum)
Medication Instructions:  No Changes In Medications at this time.  *If you need a refill on your cardiac medications before your next appointment, please call your pharmacy*   Lab Work: BMET- Please have this done before next appointment with Dr. Tresa Endo CBC- Please have this done before next appointment with Dr. Tresa Endo If you have labs (blood work) drawn today and your tests are completely normal, you will receive your results only by: Marland Kitchen MyChart Message (if you have MyChart) OR . A paper copy in the mail If you have any lab test that is abnormal or we need to change your treatment, we will call you to review the results.   Testing/Procedures: Referral Made to Pulmonology for Pulmonary Function Test   Follow-Up: At [facility], you and your health needs are our priority.  As part of our continuing mission to provide you with exceptional heart care, we have created designated Provider Care Teams.  These Care Teams include your primary Cardiologist (physician) and Advanced Practice Providers (APPs -  Physician Assistants and Nurse Practitioners) who all work together to provide you with the care you need, when you need it.  We recommend signing up for the patient portal called "MyChart".  Sign up information is provided on this After Visit Summary.  MyChart is used to connect with patients for Virtual Visits (Telemedicine).  Patients are able to view lab/test results, encounter notes, upcoming appointments, etc.  Non-urgent messages can be sent to your provider as well.   To learn more about what you can do with MyChart, go to [email or web address].    Your next appointment:   3 month(s)  The format for your next appointment:   In Person  Provider:   Nicki Guadalajara, MD   .  Other Instructions  Referral made to Pulmonology for Pulmonary Function Test

## 2019-12-19 ENCOUNTER — Institutional Professional Consult (permissible substitution): Payer: Medicare Other | Admitting: Pulmonary Disease

## 2020-01-18 ENCOUNTER — Institutional Professional Consult (permissible substitution): Payer: Medicare Other | Admitting: Pulmonary Disease

## 2020-02-06 ENCOUNTER — Institutional Professional Consult (permissible substitution): Payer: Medicare Other | Admitting: Pulmonary Disease

## 2020-02-21 LAB — CBC
Hematocrit: 35.5 % (ref 34.0–46.6)
Hemoglobin: 11.6 g/dL (ref 11.1–15.9)
MCH: 30.4 pg (ref 26.6–33.0)
MCHC: 32.7 g/dL (ref 31.5–35.7)
MCV: 93 fL (ref 79–97)
Platelets: 122 10*3/uL — ABNORMAL LOW (ref 150–450)
RBC: 3.82 x10E6/uL (ref 3.77–5.28)
RDW: 12.8 % (ref 11.7–15.4)
WBC: 5 10*3/uL (ref 3.4–10.8)

## 2020-02-21 LAB — BASIC METABOLIC PANEL
BUN/Creatinine Ratio: 17 (ref 12–28)
BUN: 12 mg/dL (ref 8–27)
CO2: 28 mmol/L (ref 20–29)
Calcium: 9.1 mg/dL (ref 8.7–10.3)
Chloride: 103 mmol/L (ref 96–106)
Creatinine, Ser: 0.69 mg/dL (ref 0.57–1.00)
GFR calc Af Amer: 97 mL/min/{1.73_m2} (ref >59)
GFR calc non Af Amer: 84 mL/min/{1.73_m2} (ref >59)
Glucose: 105 mg/dL — ABNORMAL HIGH (ref 65–99)
Potassium: 4.7 mmol/L (ref 3.5–5.2)
Sodium: 141 mmol/L (ref 134–144)

## 2020-02-26 ENCOUNTER — Ambulatory Visit (INDEPENDENT_AMBULATORY_CARE_PROVIDER_SITE_OTHER): Payer: Medicare Other | Admitting: Cardiovascular Disease

## 2020-02-26 ENCOUNTER — Other Ambulatory Visit: Payer: Self-pay

## 2020-02-26 VITALS — BP 128/84 | HR 60 | Ht 64.0 in | Wt 138.0 lb

## 2020-02-26 DIAGNOSIS — F32A Depression, unspecified: Secondary | ICD-10-CM

## 2020-02-26 DIAGNOSIS — E785 Hyperlipidemia, unspecified: Secondary | ICD-10-CM | POA: Diagnosis not present

## 2020-02-26 DIAGNOSIS — K219 Gastro-esophageal reflux disease without esophagitis: Secondary | ICD-10-CM

## 2020-02-26 DIAGNOSIS — Z720 Tobacco use: Secondary | ICD-10-CM

## 2020-02-26 DIAGNOSIS — I10 Essential (primary) hypertension: Secondary | ICD-10-CM | POA: Diagnosis not present

## 2020-02-26 DIAGNOSIS — I25118 Atherosclerotic heart disease of native coronary artery with other forms of angina pectoris: Secondary | ICD-10-CM | POA: Diagnosis not present

## 2020-02-26 DIAGNOSIS — J45909 Unspecified asthma, uncomplicated: Secondary | ICD-10-CM

## 2020-02-26 DIAGNOSIS — F419 Anxiety disorder, unspecified: Secondary | ICD-10-CM

## 2020-02-26 MED ORDER — METOPROLOL TARTRATE 25 MG PO TABS: 25.0000 mg | ORAL_TABLET | Freq: Two times a day (BID) | ORAL | 3 refills | Status: DC

## 2020-02-26 MED ORDER — OLMESARTAN MEDOXOMIL 40 MG PO TABS: 40.0000 mg | ORAL_TABLET | Freq: Every day | ORAL | 3 refills | Status: DC

## 2020-02-26 MED ORDER — ATORVASTATIN CALCIUM 80 MG PO TABS: 80.0000 mg | ORAL_TABLET | Freq: Every day | ORAL | 3 refills | Status: DC

## 2020-02-26 MED ORDER — EZETIMIBE 10 MG PO TABS: 10.0000 mg | ORAL_TABLET | Freq: Every day | ORAL | 3 refills | Status: DC

## 2020-02-26 MED ORDER — ISOSORBIDE MONONITRATE ER 30 MG PO TB24: 30.0000 mg | ORAL_TABLET | Freq: Every day | ORAL | 3 refills | Status: DC

## 2020-02-26 MED ORDER — DILTIAZEM HCL ER COATED BEADS 300 MG PO CP24: 300.0000 mg | ORAL_CAPSULE | Freq: Every day | ORAL | 3 refills | Status: DC

## 2020-02-26 NOTE — Patient Instructions (Signed)
Medication Instructions:  °No changes °*If you need a refill on your cardiac medications before your next appointment, please call your pharmacy* ° ° °Lab Work: °None ordered °If you have labs (blood work) drawn today and your tests are completely normal, you will receive your results only by: °• MyChart Message (if you have MyChart) OR °• A paper copy in the mail °If you have any lab test that is abnormal or we need to change your treatment, we will call you to review the results. ° ° °Testing/Procedures: °None ordered ° ° °Follow-Up: °At [facility], you and your health needs are our priority.  As part of our continuing mission to provide you with exceptional heart care, we have created designated Provider Care Teams.  These Care Teams include your primary Cardiologist (physician) and Advanced Practice Providers (APPs -  Physician Assistants and Nurse Practitioners) who all work together to provide you with the care you need, when you need it. ° °We recommend signing up for the patient portal called "MyChart".  Sign up information is provided on this After Visit Summary.  MyChart is used to connect with patients for Virtual Visits (Telemedicine).  Patients are able to view lab/test results, encounter notes, upcoming appointments, etc.  Non-urgent messages can be sent to your provider as well.   °To learn more about what you can do with MyChart, go to [email or web address].   ° °Your next appointment:   °6 month(s) ° °The format for your next appointment:   °In Person ° °Provider:   °Thomas Kelly, MD ° °Your physician wants you to follow-up in: 6 months. You will receive a reminder letter in the mail two months in advance. If you don't receive a letter, please call our office to schedule the follow-up appointment. ° °Other Instructions °None ° °

## 2020-02-26 NOTE — Progress Notes (Signed)
Patient ID: Judith Stevens, female   DOB: May 05, 1940, 79 y.o.   MRN: [id number]     HPI: Judith Stevens is a 79 y.o. female who presents for a 7 month follow-up cardiology evaluation.   Judith Stevens has CAD and in 2001 underwent cardiac catheterization while in [place] which showed a 60% ostial narrowing of her LAD. Repeat catheterization by me in 2009 did not show any significant interval change. Unfortunately, she has continued her ongoing tobacco habit but is now smoking significantly reduced at less than one quarter pack per day.  She has a history of hypertension as well as hyperlipidemia.  A nuclear perfusion study in August 2013 which was unchanged from 2 years previously and continued to show normal perfusion.   In December 2015 prior to undergoing right hip surgery a 2 year follow-up nuclear perfusion study on 03/13/2014 was performed preoperatively.  This continued to show normal perfusion without scar or ischemia.  She tolerated her hip surgery well.  She had smoked for 50 years before finally quitting smoking when I had seen her last.  When I last saw he 2 months ago, she had complained of experiencing increasing shortness of breath of 6 weeks duration that was not consistently exertionally precipitated.   She admitted to fatigue.  She is unaware of palpitations.  She has hyperlipidemia and has been taking atorvastatin 80 mg and Zetia 10 mg in attempt to continue to induce plaque regression.    An echo Doppler study on 02/18/2015 showed an EF of 55-60%, moderate thickening and calcification of a trileaflet aortic valve, consistent with sclerosis but without stenosis and mitral annular calcification.  Laboratory revealed excellent lipid studies with a total cholesterol 149, triglycerides 55, HDL 85 and her LDL was now 53.  Thyroid function studies were normal.  Her renal function was excellent.  LFTs were normal.  Vitamin D was normal at 41 and her vitamin B-12 level was normal at  448.    She was seen by [name] in October 2017 with complaints of dyspnea on exertion.  She has a history of asthma as well as numerous allergies to foods and some drugs.  She tells me she had the flu in early January 2018.  When I last saw her in January 2018.  She had stop smoking.  She did have complaints of dyspnea.  I recommended a follow-up echo Doppler study which was done on 04/29/2016.  This showed normal LV function and normal diastolic parameters.  The aortic valve was mildly calcified and was without stenosis.  There was mild aortic insufficiency.  Her mitral valve annulus was calcified and leaflets were mildly thickened.  Her left atrium was mildly dilated.   When I saw her in November 2018 she had been taking caring for her sister who had undergone aortic stent grafting.  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  When I saw her, I recommended complete smoking cessation and suggested a pulmonary evaluation with Dr. Curt Jews.   She underwent a coronary CT, which showed a calcium score of 81 which was 65 percentile for age and sex matched control.  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  There is right dominance.  There was diffuse calcified plaque in the RCA and proximal LAD with narrowing of 25-50%.  7.  She underwent FFR analysis with both lesions being in the normal FFR range  of greater than 0.80.  She has significantly reduced her tobacco and now smokes very intermittently.   I saw her in May 04, 2017 currently at that time it appeared that she was on both low-dose amlodipine in addition to diltiazem.  I discontinued amlodipine and further titrated diltiazem.  Over the past year she has done fairly well.  She had seen Mammie Russian, PA C in August 2019.  She had experienced occasional twinges of chest pain.  He had undergone cataract surgery.  I saw her in January 2020  in the office and  in April 2020 in a telemedicine visit.  At that time, she was tolerating olmesartan 20 mg with her diltiazem for blood pressure control.  There was concerns of GI issues and she is in need for a colonoscopy.  She had been evaluated by Dr. Benson Norway and this had been deferred with the COVID-19 pandemic.    I last saw her in September 2020.  At that time she denied any chest pain PND orthopnea.  Her blood pressure had been stable typically running in the 120s to 140s at home.  Recently, her sister died with cardiogenic shock and sepsis.  She has seen Dr. Verlin Fester for allergies.  During that evaluation, her blood pressure was elevated at 150/64.  She had been under increased stress particularly with her sister's death.  I recommended further titration of olmesartan to 30 mg she was continue to take diltiazem CD 300 mg daily.  I recommended that if her blood pressure consistently stayed above 850 systolically further titration to olmesartan 40 mg would be necessary.  I saw Judith Stevens in the office on April 14, 2019.  Over the last several months she had felt fairly well but for the past 6 to 8 weeks she has noticed more shortness of breath than she had experienced previously.  Approximately 3 weeks ago, she had an episode of chest tightness which resolved.  She denies any significant chest pain since that time and denies any nocturnal symptoms.  She has experienced shortness of breath with activity and has taken a terbutaline tablet which she had had and noted improvement.  She denies any PND orthopnea.  She denies any nocturnal chest tightness.  During her evaluation, her ECG showed sinus rhythm at 70 bpm with an isolated PVC and new inferior and anterolateral T wave inversion of 3 to 5 mm in leads II 3 and F, V4 through V6.  During that evaluation I discussed with her my concern for her change in ECG with new T wave inversion.  She told me she had not had an episode of chest pain for over 3 weeks.   However she was noticing shortness of breath with less activity.  I discussed options including admission to the hospital on that day versus adding additional medication to her regimen.  At the time she preferred that I do her cardiac catheterization.  I added isosorbide 30 mg daily to her medical regimen as well as metoprolol tartrate 12.5 mg twice daily I recommended reinstitution of aspirin which she had discontinued.  She continues to be on atorvastatin 80 mg daily, olmesartan 30 mg for blood pressure.  She denied any nocturnal symptoms.  I recommended that she return to the office today April 17, 2019 for reassessment of the effects of her medical therapy and repeat ECG.  Over the weekend, she actually has felt significantly better and denies any chest tightness or shortness of breath.  However, her EKG in  the office today now shows sinus bradycardia at 51 bpm with more pronounced T wave abnormality of approximately 10 mm in V3 through V6 in addition to 4 mm inferiorly.  As result, admission to the hospital was recommended and she was admitted with plans to institute heparinization and as long as she is stable plan cardiac catheterization tomorrow or later today if symptoms change.  She underwent successful cardiac catheterization on April 18, 2019 by Dr. Ellyn Hack.  She was found to have moderate mid LAD stenosis of 55%.  In some views this appeared greater.  FFR analysis was negative at 0.85.  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  Medical therapy was recommended.  She was seen in follow-up by Coletta Memos, MD in the office on April 28, 2019 and remained fairly stable.    I last saw her in April 2021.  Over the previous 3 months she continued to do well. She specifically denied any episodes of chest pain, PND, orthopnea.  She has continued to be on diltiazem 300 mg daily at bedtime,  isosorbide 30 mg daily in addition to olmesartan currently at 30 mg as well as metoprolol tartrate 25 mg twice a day.  She was unaware of any tachypalpitations.  She is sleeping well.   She continues to be on atorvastatin 80 mg and Zetia 10 mg for aggressive lipid management.  GERD is controlled with pantoprazole.  Her ECG showed complete resolution of her prior markedly abnormal T wave abnormalities.  I discussed possible potential microvascular angina having benefit with ranolazine therapy which could be added in the future.  I further titrated olmesartan to 40 mg daily.  She was off tobacco since January 2021.  Presently, she has remained stable.  She is no longer smoking.  She denies chest pain.  Her blood pressure has been running in the 120s to 130 range at home.  She had stopped taking her aspirin on her own.  She is scheduled to see Dr. Vaughan Browner for pulmonary evaluation.  She presents for evaluation today.    Past Medical History:  Diagnosis Date  . Anxiety   . Aortic valve stenosis   . Arthritis   . Asthma   . CAD (coronary artery disease)    single vessel  . COPD (chronic obstructive pulmonary disease) ([place])   . GERD (gastroesophageal reflux disease)   . Headache   . History of stress test 10/2011   Resting images reveal a normal pattern of perfusion in all regions. The post stress myocardial perfusion images show a normal pattern of perfusion in all region. The post stress left ventricle is normal size. the rest left ventricle is normal size, there is no scintigraphic evidence of inductible myocardial ischemia.  Marland Kitchen Hx of echocardiogram 09/2009   EF >55% Normal Echo  . Hyperlipidemia   . Hypertension   . Migraines   . Pneumonia    hx of  . Pulmonary emboli ([place]) 5/12  . Shortness of breath dyspnea    exertion or eating something shes allergic too  . Tobacco abuse     Past Surgical History:  Procedure Laterality Date  . ADENOIDECTOMY    . BACK SURGERY  2012  . CARDIAC  CATHETERIZATION  10/13/2007   60% narrowing twin LAD system  . DILATION AND CURETTAGE OF UTERUS    . FOOT SURGERY  2002,2013   right  . INTRAVASCULAR PRESSURE WIRE/FFR  STUDY N/A 04/18/2019   Procedure: INTRAVASCULAR PRESSURE WIRE/FFR STUDY;  Surgeon: Leonie Man, MD;  Location: [facility];  Service: Cardiovascular;  Laterality: N/A;  . KNEE SURGERY  01/17/2007   right  . LEFT HEART CATH AND CORONARY ANGIOGRAPHY N/A 04/18/2019   Procedure: LEFT HEART CATH AND CORONARY ANGIOGRAPHY;  Surgeon: Leonie Man, MD;  Location: [facility];  Service: Cardiovascular;  Laterality: N/A;  . SACROILIAC JOINT FUSION Right 03/28/2014   Procedure: [place];  Surgeon: Sinclair Ship, MD;  Location: [facility];  Service: Orthopedics;  Laterality: Right;  Right sided sacroliac joint fusion  . TONSILLECTOMY      Allergies  Allergen Reactions  . Cefzil [Cefprozil] Shortness Of Breath  . Keflex [Cephalexin] Shortness Of Breath and Rash  . Lactose Intolerance (Gi) Shortness Of Breath    ALL DAIRY PRODUCTS  . Levaquin [Levofloxacin] Shortness Of Breath and Rash  . Naproxen Shortness Of Breath    Shortness of breath Shortness of breath  . Oxycodone-Acetaminophen Nausea And Vomiting and Shortness Of Breath  . Peanuts [Peanut Oil] Shortness Of Breath  . Penicillins Shortness Of Breath  . Percocet [Oxycodone-Acetaminophen] Shortness Of Breath  . Red Dye Shortness Of Breath  . Sulfa Antibiotics Shortness Of Breath  . Sulfacetamide Sodium Shortness Of Breath  . Tetracycline Hcl Shortness Of Breath  . Tetracyclines & Related Shortness Of Breath  . Methocarbamol Nausea And Vomiting  . Prednisone Other (See Comments)    Rash  . Tomato Other (See Comments)    SOB  . Amoxicillin-Pot Clavulanate Rash  . Ibuprofen Rash  . Moxifloxacin Rash  . Penicillin G Rash  . Sulfamethoxazole-Trimethoprim Rash  . Tetracycline Rash  . Tramadol Rash    Current Outpatient Medications    Medication Sig Dispense Refill  . ALPRAZolam (XANAX) 1 MG tablet Take 1 mg by mouth 3 (three) times daily as needed for anxiety.     Marland Kitchen aspirin EC 81 MG tablet Take 81 mg by mouth daily.    Marland Kitchen atorvastatin (LIPITOR) 80 MG tablet Take 1 tablet (80 mg total) by mouth daily. 90 tablet 3  . budesonide (PULMICORT) 0.5 MG/2ML nebulizer solution USE AS DIRECTED FOR NASAL RINSE 120 mL 0  . butalbital-acetaminophen-caffeine (FIORICET) 50-325-40 MG tablet Take 1 tablet by mouth every 6 (six) hours as needed.    . Cholecalciferol (VITAMIN D3) 2000 UNITS TABS Take 2,000 Units by mouth daily.     Marland Kitchen diltiazem (CARDIZEM CD) 300 MG 24 hr capsule Take 1 capsule (300 mg total) by mouth at bedtime. 90 capsule 3  . escitalopram (LEXAPRO) 20 MG tablet Take 1 tablet (20 mg total) by mouth daily. 90 tablet 0  . ezetimibe (ZETIA) 10 MG tablet Take 1 tablet (10 mg total) by mouth at bedtime. 90 tablet 3  . fluticasone (FLONASE) 50 MCG/ACT nasal spray 2 sprays by Each Nare route daily.    . Fluticasone-Salmeterol (ADVAIR DISKUS) 250-50 MCG/DOSE AEPB Inhale 1 puff into the lungs 2 (two) times daily.    . isosorbide mononitrate (IMDUR) 30 MG 24 hr tablet Take 1 tablet (30 mg total) by mouth daily. 90 tablet 3  . Multiple Vitamin (MULTI-VITAMIN DAILY PO) Take 1 tablet by mouth daily.    . nitroGLYCERIN (NITROSTAT) 0.4 MG SL tablet Place 1 tablet (0.4 mg total) under the tongue every 5 (five) minutes as needed for chest pain. Needs appointment for future refills 25 tablet 0  . olmesartan (BENICAR) 40 MG tablet Take  1 tablet (40 mg total) by mouth daily. 90 tablet 3  . ondansetron (ZOFRAN) 4 MG tablet Take 4 mg by mouth every 8 (eight) hours as needed for nausea.    . pantoprazole (PROTONIX) 40 MG tablet Take 40 mg by mouth 2 (two) times daily.     . terbutaline (BRETHINE) 5 MG tablet Take 5 mg by mouth every 6 (six) hours as needed (asthma).     . VENTOLIN HFA 108 (90 Base) MCG/ACT inhaler Inhale 2 puffs into the lungs every 6  (six) hours as needed for wheezing.   11  . metoprolol tartrate (LOPRESSOR) 25 MG tablet Take 1 tablet (25 mg total) by mouth 2 (two) times daily. 90 tablet 3   No current facility-administered medications for this visit.    Social History   Socioeconomic History  . Marital status: Widowed    Spouse name: Not on file  . Number of children: Not on file  . Years of education: Not on file  . Highest education level: Not on file  Occupational History  . Not on file  Tobacco Use  . Smoking status: Former Smoker    Packs/day: 0.10    Types: Cigarettes    Start date: 01/30/2014    Quit date: 04/15/2019    Years since quitting: 0.8  . Smokeless tobacco: Former Systems developer    Quit date: 01/29/2014  Vaping Use  . Vaping Use: Never used  Substance and Sexual Activity  . Alcohol use: Yes    Alcohol/week: 4.0 standard drinks    Types: 2 Glasses of wine, 2 Cans of beer per week  . Drug use: No  . Sexual activity: Not on file  Other Topics Concern  . Not on file  Social History Narrative  . Not on file   Social Determinants of Health   Financial Resource Strain:   . Difficulty of Paying Living Expenses: Not on file  Food Insecurity:   . Worried About Charity fundraiser in the Last Year: Not on file  . Ran Out of Food in the Last Year: Not on file  Transportation Needs:   . Lack of Transportation (Medical): Not on file  . Lack of Transportation (Non-Medical): Not on file  Physical Activity:   . Days of Exercise per Week: Not on file  . Minutes of Exercise per Session: Not on file  Stress:   . Feeling of Stress : Not on file  Social Connections:   . Frequency of Communication with Friends and Family: Not on file  . Frequency of Social Gatherings with Friends and Family: Not on file  . Attends Religious Services: Not on file  . Active Member of Clubs or Organizations: Not on file  . Attends Archivist Meetings: Not on file  . Marital Status: Not on file  Intimate Partner  Violence:   . Fear of Current or Ex-Partner: Not on file  . Emotionally Abused: Not on file  . Physically Abused: Not on file  . Sexually Abused: Not on file    Family History  Problem Relation Age of Onset  . Heart attack Mother   . Diabetes Mother   . Asthma Mother   . Heart attack Father   . Diabetes Sister   . Hyperlipidemia Sister   . Allergic rhinitis Neg Hx   . Angioedema Neg Hx   . Eczema Neg Hx   . Immunodeficiency Neg Hx   . Urticaria Neg Hx    Socially, she is  widowed. She has 2 children 5 grandchildren. She does not exercise. She quit smoking 2 years ago. resumed on Aug 16, 2022 after her dog died.  She is committed to completely discontinue tobacco.   ROS General: Negative; No fevers, chills, or night sweats;  HEENT: Negative; No changes in vision or hearing, sinus congestion, difficulty swallowing Pulmonary: Positive for asthma Cardiovascular: Negative; No chest pain, presyncope, syncope, palpitations GI: positive for GERD; No nausea, vomiting, diarrhea, or abdominal pain GU: Negative; No dysuria, hematuria, or difficulty voiding Musculoskeletal: She tolerated her hip surgery well. Hematologic/Oncology: Negative; no easy bruising, bleeding Endocrine: Negative; no heat/cold intolerance; no diabetes Neuro: Negative; no changes in balance, headaches Skin: Negative; No rashes or skin lesions Psychiatric: Negative; No behavioral problems, depression Sleep: Negative; No snoring, daytime sleepiness, hypersomnolence, bruxism, restless legs, hypnogognic hallucinations, no cataplexy Other comprehensive 14 point system review is negative.  Nutritionally she cannot have dairy products due to significant milk allergy.  PE BP 128/84   Pulse 60   Ht '5\' 4"'  (1.626 m)   Wt 138 lb (62.6 kg)   LMP  (LMP Unknown)   BMI 23.69 kg/m    Repeat blood pressure by me was 150/80  Wt Readings from Last 3 Encounters:  02/26/20 138 lb (62.6 kg)  11/08/19 133 lb (60.3 kg)  07/14/19 133  lb (60.3 kg)   General: Alert, oriented, no distress.  Skin: normal turgor, no rashes, warm and dry HEENT: Normocephalic, atraumatic. Pupils equal round and reactive to light; sclera anicteric; extraocular muscles intact;  Nose without nasal septal hypertrophy Mouth/Parynx benign; Mallinpatti scale 3 Neck: No JVD, no carotid bruits; normal carotid upstroke Lungs: clear to ausculatation and percussion; no wheezing or rales Chest wall: without tenderness to palpitation Heart: PMI not displaced, RRR, s1 s2 normal, 1/6 systolic murmur, no diastolic murmur, no rubs, gallops, thrills, or heaves Abdomen: soft, nontender; no hepatosplenomehaly, BS+; abdominal aorta nontender and not dilated by palpation. Back: no CVA tenderness Pulses 2+ Musculoskeletal: full range of motion, normal strength, no joint deformities Extremities: no clubbing cyanosis or edema, Homan's sign negative  Neurologic: grossly nonfocal; Cranial nerves grossly wnl Psychologic: Normal mood and affect   ECG (independently read by me): Normal sinus rhythm at 60 bpm.  Poor R wave progression V1 through V3.  Normal intervals.  No ectopy.  April 2021 ECG (independently read by me): Sinus bradycardia 51 bpm.  Marked improvement in prior T wave abnormality with minimal nonspecific ST change in lead III.     April 17, 2019 ECG (independently read by me): Sinus bradycardia 51 bpm.  LVH by voltage criteria.  More deeply inverted inferolateral T wave inversion 8 to 10 mm in the anterolateral leads in 2 to 4 mm inferiorly  April 14, 2019 ECG (independently read by me): Normal sinus rhythm at 70 bpm.  New inferior and anterolateral T wave inversion.  PR interval 170 ms, QTc interval 444 ms; an isolated PVC  September 2020 ECG (independently read by me): Sinus bradycardia at 58 bpm.  Nonspecific ST changes.  Poor R wave progression V1 through V3  January 2020 ECG (independently read by me): Normal sinus rhythm at 61 bpm.  No ectopy.   Specific ST change in lead III.  February 2019 ECG (independently read by me): Normal sinus rhythm at 72 bpm.  Nonspecific ST changes inferiorly.  QTc interval 451 ms.  June 2018 ECG (independently read by me): Sinus bradycardia 57 bpm.  No significant ST-T changes.  Normal intervals.  January 2018  ECG (independently read by me): Normal sinus rhythm at 69 bpm.  No ectopy.  Normal intervals.  November 2016 ECG (independently read by me): Normal sinus rhythm at 60 bpm.  No significant ST-T changes.  Normal intervals.  November 2015 ECG (independently read by me): Normal sinus rhythm at 72 bpm.  Normal intervals.  No significant ST-T changes.  October 2014 ECG: Sinus rhythm with occasional isolated PVC. Heart rate 68 beats per minute. QTc interval 440 ms.  LAB: BMP Latest Ref Rng & Units 02/21/2020 04/18/2019 04/17/2019  Glucose 65 - 99 mg/dL 105(H) 101(H) 156(H)  BUN 8 - 27 mg/dL '12 15 14  ' Creatinine 0.57 - 1.00 mg/dL 0.69 0.58 0.62  BUN/Creat Ratio 12 - 28 17 - -  Sodium 134 - 144 mmol/L 141 140 141  Potassium 3.5 - 5.2 mmol/L 4.7 3.8 4.5  Chloride 96 - 106 mmol/L 103 106 103  CO2 20 - 29 mmol/L '28 25 30  ' Calcium 8.7 - 10.3 mg/dL 9.1 8.6(L) 9.2   Hepatic Function Latest Ref Rng & Units 12/09/2018 11/03/2017 02/08/2017  Total Protein 6.0 - 8.5 g/dL 6.7 6.1 6.6  Albumin 3.7 - 4.7 g/dL 4.9(H) 4.5 4.7  AST 0 - 40 IU/L '21 16 19  ' ALT 0 - 32 IU/L '26 22 18  ' Alk Phosphatase 39 - 117 IU/L 63 67 78  Total Bilirubin 0.0 - 1.2 mg/dL 0.3 0.2 0.3   CBC Latest Ref Rng & Units 02/21/2020 04/18/2019 04/17/2019  WBC 3.4 - 10.8 x10E3/uL 5.0 4.6 4.8  Hemoglobin 11.1 - 15.9 g/dL 11.6 11.3(L) 11.9(L)  Hematocrit 34.0 - 46.6 % 35.5 33.4(L) 35.8(L)  Platelets 150 - 450 x10E3/uL 122(L) 123(L) 132(L)   Lab Results  Component Value Date   MCV 93 02/21/2020   MCV 92.3 04/18/2019   MCV 93.7 04/17/2019   Lab Results  Component Value Date   TSH 2.150 11/03/2017   No results found for: HGBA1C   Lipid  Panel     Component Value Date/Time   CHOL 159 12/09/2018 0931   TRIG 46 12/09/2018 0931   HDL 81 12/09/2018 0931   CHOLHDL 2.0 12/09/2018 0931   CHOLHDL 2.0 04/27/2016 0923   VLDL 10 04/27/2016 0923   LDLCALC 68 12/09/2018 0931    RADIOLOGY: No results found.  IMPRESSION:  1. Coronary artery disease involving native coronary artery of native heart with other form of angina pectoris ([place])   2. Essential hypertension   3. Hyperlipidemia LDL goal <70   4. Prior tobacco abuse   5. Mild asthma without complication, unspecified whether persistent   6. Anxiety and depression   7. Gastroesophageal reflux disease without esophagitis     ASSESSMENT AND PLAN: Judith Stevens is a 79 year-old female who had smoked for over 50 years and quit smoking 2 years ago.  Unfortunately, she was under significant stress with the  death of her dog and resumed smoking in May 2018 continued to smoke intermittently but  again quit smoking in March 2020.  Remotely she was found to have she has CAD with a 60% ostial LAD stenosis dating back to 2001 and which again was noted on her repeat catheterization by me in 2009.  We have been aggressively treating her lipids in attempt to induce plaque regression and she has tolerated Lipitor 80 mg in addition to Zetia 10 mg.  Laboratory on December 09, 2018 showed a total cholesterol 159, triglycerides 46, HDL 81, and LDL cholesterol at 68.  A previous coronary CT  angiography in December 2018 did not demonstrate significant progression of disease and showed 25-50% stenoses in the LAD and RCA; FFR was negative.  I had seen her in early 2020 and had instituted olmesartan due to continued blood pressure elevation.  She had experienced increased stress at the time of her last evaluation in Jan 02, 2019 due to her sister's death.  At that time her blood pressure was elevated and I further titrated olmesartan.  She resumed tobacco use.  When I had seen her in January 2021 she had  experienced more episodes of shortness of breath with activity and she developed new inferolateral T wave inversion worrisome for ischemia.  Due to progressive ECG changes she ultimately was hospitalized and catheterization revealed bifurcation narrowing in a twin like LAD vessel with narrowing of at least 55% or greater in the more inferior branch.  FFR analysis was negative.  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  She has since quit smoking in January.  I believe this has had significant benefit particularly with reference to endothelial dysfunction and potential nicotine mediated vasospasm.  She is now on a medical regimen consisting of diltiazem 300 mg daily, isosorbide 30 mg, metoprolol 25 mg twice a day and olmesartan 40 mg.  Her blood pressure initially was normal but on repeat by me was elevated today.  She states her blood pressure at home typically has been running in the 128 to low 130 range.  She had stopped taking her aspirin I have recommended reinstitution of 81 mg and if not daily to at least every other day.  She is on Zetia 10 mg and atorvastatin 80 mg for hyperlipidemia.  Target LDL is less than 70.  She has continued to be on Advair and Pulmicort in addition to as needed terbutaline and will be seeing Dr. Vaughan Browner for pulmonary evaluation.  Her GERD is currently controlled with pantoprazole.  She continues to be on Lexapro for anxiety/depression.  Troy Sine, MD, [facility]  02/28/2020 12:25 PM

## 2020-02-28 ENCOUNTER — Encounter: Payer: Self-pay | Admitting: Cardiovascular Disease

## 2020-02-29 ENCOUNTER — Institutional Professional Consult (permissible substitution): Payer: Medicare Other | Admitting: Pulmonary Disease

## 2020-03-20 ENCOUNTER — Institutional Professional Consult (permissible substitution): Payer: Medicare Other | Admitting: Pulmonary Disease

## 2020-04-16 ENCOUNTER — Institutional Professional Consult (permissible substitution): Payer: Medicare Other | Admitting: Pulmonary Disease

## 2020-04-16 NOTE — Progress Notes (Incomplete)
Synopsis: Referred in *** for ***  Subjective:   PATIENT ID: Judith Stevens GENDER: female DOB: Jul 01, 1940, MRN: [id number]   HPI  No chief complaint on file.   ***  Past Medical History:  Diagnosis Date  . Anxiety   . Aortic valve stenosis   . Arthritis   . Asthma   . CAD (coronary artery disease)    single vessel  . COPD (chronic obstructive pulmonary disease) (HCC)   . GERD (gastroesophageal reflux disease)   . Headache   . History of stress test 10/2011   Resting images reveal a normal pattern of perfusion in all regions. The post stress myocardial perfusion images show a normal pattern of perfusion in all region. The post stress left ventricle is normal size. the rest left ventricle is normal size, there is no scintigraphic evidence of inductible myocardial ischemia.  Marland Kitchen Hx of echocardiogram 09/2009   EF >55% Normal Echo  . Hyperlipidemia   . Hypertension   . Migraines   . Pneumonia    hx of  . Pulmonary emboli (HCC) 5/12  . Shortness of breath dyspnea    exertion or eating something shes allergic too  . Tobacco abuse      Family History  Problem Relation Age of Onset  . Heart attack Mother   . Diabetes Mother   . Asthma Mother   . Heart attack Father   . Diabetes Sister   . Hyperlipidemia Sister   . Allergic rhinitis Neg Hx   . Angioedema Neg Hx   . Eczema Neg Hx   . Immunodeficiency Neg Hx   . Urticaria Neg Hx      Social History   Socioeconomic History  . Marital status: Widowed    Spouse name: Not on file  . Number of children: Not on file  . Years of education: Not on file  . Highest education level: Not on file  Occupational History  . Not on file  Tobacco Use  . Smoking status: Former Smoker    Packs/day: 0.10    Types: Cigarettes    Start date: 01/30/2014    Quit date: 04/15/2019    Years since quitting: 1.0  . Smokeless tobacco: Former Neurosurgeon    Quit date: 01/29/2014  Vaping Use  . Vaping Use: Never used  Substance and Sexual  Activity  . Alcohol use: Yes    Alcohol/week: 4.0 standard drinks    Types: 2 Glasses of wine, 2 Cans of beer per week  . Drug use: No  . Sexual activity: Not on file  Other Topics Concern  . Not on file  Social History Narrative  . Not on file   Social Determinants of Health   Financial Resource Strain: Not on file  Food Insecurity: Not on file  Transportation Needs: Not on file  Physical Activity: Not on file  Stress: Not on file  Social Connections: Not on file  Intimate Partner Violence: Not on file     Allergies  Allergen Reactions  . Cefzil [Cefprozil] Shortness Of Breath  . Keflex [Cephalexin] Shortness Of Breath and Rash  . Lactose Intolerance (Gi) Shortness Of Breath    ALL DAIRY PRODUCTS  . Levaquin [Levofloxacin] Shortness Of Breath and Rash  . Naproxen Shortness Of Breath    Shortness of breath Shortness of breath  . Oxycodone-Acetaminophen Nausea And Vomiting and Shortness Of Breath  . Peanuts [Peanut Oil] Shortness Of Breath  . Penicillins Shortness Of Breath  . Percocet [Oxycodone-Acetaminophen] Shortness  Of Breath  . Red Dye Shortness Of Breath  . Sulfa Antibiotics Shortness Of Breath  . Sulfacetamide Sodium Shortness Of Breath  . Tetracycline Hcl Shortness Of Breath  . Tetracyclines & Related Shortness Of Breath  . Methocarbamol Nausea And Vomiting  . Prednisone Other (See Comments)    Rash  . Tomato Other (See Comments)    SOB  . Amoxicillin-Pot Clavulanate Rash  . Ibuprofen Rash  . Moxifloxacin Rash  . Penicillin G Rash  . Sulfamethoxazole-Trimethoprim Rash  . Tetracycline Rash  . Tramadol Rash     Outpatient Medications Prior to Visit  Medication Sig Dispense Refill  . ALPRAZolam (XANAX) 1 MG tablet Take 1 mg by mouth 3 (three) times daily as needed for anxiety.     Marland Kitchen aspirin EC 81 MG tablet Take 81 mg by mouth daily.    Marland Kitchen atorvastatin (LIPITOR) 80 MG tablet Take 1 tablet (80 mg total) by mouth daily. 90 tablet 3  . budesonide  (PULMICORT) 0.5 MG/2ML nebulizer solution USE AS DIRECTED FOR NASAL RINSE 120 mL 0  . butalbital-acetaminophen-caffeine (FIORICET) 50-325-40 MG tablet Take 1 tablet by mouth every 6 (six) hours as needed.    . Cholecalciferol (VITAMIN D3) 2000 UNITS TABS Take 2,000 Units by mouth daily.     Marland Kitchen diltiazem (CARDIZEM CD) 300 MG 24 hr capsule Take 1 capsule (300 mg total) by mouth at bedtime. 90 capsule 3  . escitalopram (LEXAPRO) 20 MG tablet Take 1 tablet (20 mg total) by mouth daily. 90 tablet 0  . ezetimibe (ZETIA) 10 MG tablet Take 1 tablet (10 mg total) by mouth at bedtime. 90 tablet 3  . fluticasone (FLONASE) 50 MCG/ACT nasal spray 2 sprays by Each Nare route daily.    . Fluticasone-Salmeterol (ADVAIR DISKUS) 250-50 MCG/DOSE AEPB Inhale 1 puff into the lungs 2 (two) times daily.    . isosorbide mononitrate (IMDUR) 30 MG 24 hr tablet Take 1 tablet (30 mg total) by mouth daily. 90 tablet 3  . metoprolol tartrate (LOPRESSOR) 25 MG tablet Take 1 tablet (25 mg total) by mouth 2 (two) times daily. 90 tablet 3  . Multiple Vitamin (MULTI-VITAMIN DAILY PO) Take 1 tablet by mouth daily.    . nitroGLYCERIN (NITROSTAT) 0.4 MG SL tablet Place 1 tablet (0.4 mg total) under the tongue every 5 (five) minutes as needed for chest pain. Needs appointment for future refills 25 tablet 0  . olmesartan (BENICAR) 40 MG tablet Take 1 tablet (40 mg total) by mouth daily. 90 tablet 3  . ondansetron (ZOFRAN) 4 MG tablet Take 4 mg by mouth every 8 (eight) hours as needed for nausea.    . pantoprazole (PROTONIX) 40 MG tablet Take 40 mg by mouth 2 (two) times daily.     . terbutaline (BRETHINE) 5 MG tablet Take 5 mg by mouth every 6 (six) hours as needed (asthma).     . VENTOLIN HFA 108 (90 Base) MCG/ACT inhaler Inhale 2 puffs into the lungs every 6 (six) hours as needed for wheezing.   11   No facility-administered medications prior to visit.    ROS    Objective:  There were no vitals filed for this  visit.   Physical Exam    CBC    Component Value Date/Time   WBC 5.0 02/21/2020 1101   WBC 4.6 04/18/2019 0018   RBC 3.82 02/21/2020 1101   RBC 3.62 (L) 04/18/2019 0018   HGB 11.6 02/21/2020 1101   HCT 35.5 02/21/2020 1101  PLT 122 (L) 02/21/2020 1101   MCV 93 02/21/2020 1101   MCH 30.4 02/21/2020 1101   MCH 31.2 04/18/2019 0018   MCHC 32.7 02/21/2020 1101   MCHC 33.8 04/18/2019 0018   RDW 12.8 02/21/2020 1101   LYMPHSABS 1.6 04/17/2019 1617   MONOABS 0.4 04/17/2019 1617   EOSABS 0.1 04/17/2019 1617   BASOSABS 0.0 04/17/2019 1617     Chest imaging:  PFT: No flowsheet data found.  Labs:  Path:  Echo:  Heart Catheterization:       Assessment & Plan:   No diagnosis found.  Discussion: ***    Current Outpatient Medications:  .  ALPRAZolam (XANAX) 1 MG tablet, Take 1 mg by mouth 3 (three) times daily as needed for anxiety. , Disp: , Rfl:  .  aspirin EC 81 MG tablet, Take 81 mg by mouth daily., Disp: , Rfl:  .  atorvastatin (LIPITOR) 80 MG tablet, Take 1 tablet (80 mg total) by mouth daily., Disp: 90 tablet, Rfl: 3 .  budesonide (PULMICORT) 0.5 MG/2ML nebulizer solution, USE AS DIRECTED FOR NASAL RINSE, Disp: 120 mL, Rfl: 0 .  butalbital-acetaminophen-caffeine (FIORICET) 50-325-40 MG tablet, Take 1 tablet by mouth every 6 (six) hours as needed., Disp: , Rfl:  .  Cholecalciferol (VITAMIN D3) 2000 UNITS TABS, Take 2,000 Units by mouth daily. , Disp: , Rfl:  .  diltiazem (CARDIZEM CD) 300 MG 24 hr capsule, Take 1 capsule (300 mg total) by mouth at bedtime., Disp: 90 capsule, Rfl: 3 .  escitalopram (LEXAPRO) 20 MG tablet, Take 1 tablet (20 mg total) by mouth daily., Disp: 90 tablet, Rfl: 0 .  ezetimibe (ZETIA) 10 MG tablet, Take 1 tablet (10 mg total) by mouth at bedtime., Disp: 90 tablet, Rfl: 3 .  fluticasone (FLONASE) 50 MCG/ACT nasal spray, 2 sprays by Each Nare route daily., Disp: , Rfl:  .  Fluticasone-Salmeterol (ADVAIR DISKUS) 250-50 MCG/DOSE AEPB,  Inhale 1 puff into the lungs 2 (two) times daily., Disp: , Rfl:  .  isosorbide mononitrate (IMDUR) 30 MG 24 hr tablet, Take 1 tablet (30 mg total) by mouth daily., Disp: 90 tablet, Rfl: 3 .  metoprolol tartrate (LOPRESSOR) 25 MG tablet, Take 1 tablet (25 mg total) by mouth 2 (two) times daily., Disp: 90 tablet, Rfl: 3 .  Multiple Vitamin (MULTI-VITAMIN DAILY PO), Take 1 tablet by mouth daily., Disp: , Rfl:  .  nitroGLYCERIN (NITROSTAT) 0.4 MG SL tablet, Place 1 tablet (0.4 mg total) under the tongue every 5 (five) minutes as needed for chest pain. Needs appointment for future refills, Disp: 25 tablet, Rfl: 0 .  olmesartan (BENICAR) 40 MG tablet, Take 1 tablet (40 mg total) by mouth daily., Disp: 90 tablet, Rfl: 3 .  ondansetron (ZOFRAN) 4 MG tablet, Take 4 mg by mouth every 8 (eight) hours as needed for nausea., Disp: , Rfl:  .  pantoprazole (PROTONIX) 40 MG tablet, Take 40 mg by mouth 2 (two) times daily. , Disp: , Rfl:  .  terbutaline (BRETHINE) 5 MG tablet, Take 5 mg by mouth every 6 (six) hours as needed (asthma). , Disp: , Rfl:  .  VENTOLIN HFA 108 (90 Base) MCG/ACT inhaler, Inhale 2 puffs into the lungs every 6 (six) hours as needed for wheezing. , Disp: , Rfl: 11

## 2020-05-02 ENCOUNTER — Institutional Professional Consult (permissible substitution): Payer: Medicare Other | Admitting: Pulmonary Disease

## 2020-05-14 ENCOUNTER — Institutional Professional Consult (permissible substitution): Payer: Medicare Other | Admitting: Pulmonary Disease

## 2020-05-15 ENCOUNTER — Ambulatory Visit (INDEPENDENT_AMBULATORY_CARE_PROVIDER_SITE_OTHER): Payer: Medicare Other | Admitting: Internal Medicine

## 2020-05-15 ENCOUNTER — Other Ambulatory Visit: Payer: Self-pay

## 2020-05-15 ENCOUNTER — Encounter: Payer: Self-pay | Admitting: Internal Medicine

## 2020-05-15 VITALS — BP 136/80 | HR 57 | Temp 97.4°F | Ht 64.0 in | Wt 138.5 lb

## 2020-05-15 DIAGNOSIS — I25118 Atherosclerotic heart disease of native coronary artery with other forms of angina pectoris: Secondary | ICD-10-CM | POA: Diagnosis not present

## 2020-05-15 DIAGNOSIS — R0602 Shortness of breath: Secondary | ICD-10-CM

## 2020-05-15 DIAGNOSIS — F17200 Nicotine dependence, unspecified, uncomplicated: Secondary | ICD-10-CM | POA: Diagnosis not present

## 2020-05-15 NOTE — Progress Notes (Signed)
Judith Stevens    [id number]    1940-04-30  Primary Care Physician:Kaplan, Isidor Holts., PA-C  Referring Physician: Ronney Asters, NP [address] [place],  [place] [address] Reason for Consultation: shortness of breath Date of Consultation: 05/15/2020  Chief complaint:   Chief Complaint  Patient presents with  . Consult    Having SHOB with exertion.  This has been going on for over 6 months.       HPI: Judith Stevens is a 80 y.o. woman with previous history of asthma.  Here because she has ongoing symptoms of dyspnea.  She is prescribed albuterol inhaler which she takes as needed, sometimes not at all, sometimes 4 times/day. She is also prescribed advair inhaler she uses twice a day - she isn't sure if it helps her breathing.  Has shortness of breath for over 6 months, progressing. Seems to be worse with dairy. Has history of CAD and had recent LHC which demonstrated some narrowing in the LAD which was not intervened on due to non-significant physiology. Felt to be best managed medically.   She takes terbutaline which did not help her breathing, but xanax did help her breathing. She has over 24 allergies or intolerances to food and medication, some of which also make her breathing worse, especially dairy.  She denies cough, chest tightness, wheezing.   In May 2012 she had provoked acute PE in the setting of surgery - was on warfarin. Had IVC filter placed.    Social history:  Smoking history: still smoking, down to a cigarette with driving.   Social History   Occupational History  . Not on file  Tobacco Use  . Smoking status: Current Some Day Smoker    Packs/day: 1.00    Types: Cigarettes    Start date: 01/31/1959    Last attempt to quit: 04/15/2019    Years since quitting: 1.0  . Smokeless tobacco: Former Neurosurgeon    Quit date: 01/29/2014  Vaping Use  . Vaping Use: Never used  Substance and Sexual Activity  . Alcohol use: Yes     Alcohol/week: 4.0 standard drinks    Types: 2 Glasses of wine, 2 Cans of beer per week  . Drug use: No  . Sexual activity: Not on file    Relevant family history:  Family History  Problem Relation Age of Onset  . Heart attack Mother   . Diabetes Mother   . Asthma Mother   . Heart attack Father   . Diabetes Sister   . Hyperlipidemia Sister   . Allergic rhinitis Neg Hx   . Angioedema Neg Hx   . Eczema Neg Hx   . Immunodeficiency Neg Hx   . Urticaria Neg Hx     Past Medical History:  Diagnosis Date  . Anxiety   . Aortic valve stenosis   . Arthritis   . Asthma   . CAD (coronary artery disease)    single vessel  . COPD (chronic obstructive pulmonary disease) (HCC)   . GERD (gastroesophageal reflux disease)   . Headache   . History of stress test 10/2011   Resting images reveal a normal pattern of perfusion in all regions. The post stress myocardial perfusion images show a normal pattern of perfusion in all region. The post stress left ventricle is normal size. the rest left ventricle is normal size, there is no scintigraphic evidence of inductible myocardial ischemia.  Marland Kitchen Hx of echocardiogram 09/2009  EF >55% Normal Echo  . Hyperlipidemia   . Hypertension   . Migraines   . Pneumonia    hx of  . Pulmonary emboli (HCC) 5/12  . Shortness of breath dyspnea    exertion or eating something shes allergic too  . Tobacco abuse     Past Surgical History:  Procedure Laterality Date  . ADENOIDECTOMY    . BACK SURGERY  2012  . CARDIAC CATHETERIZATION  10/13/2007   60% narrowing twin LAD system  . DILATION AND CURETTAGE OF UTERUS    . FOOT SURGERY  2002,2013   right  . INTRAVASCULAR PRESSURE WIRE/FFR STUDY N/A 04/18/2019   Procedure: INTRAVASCULAR PRESSURE WIRE/FFR STUDY;  Surgeon: Marykay Lex, MD;  Location: [facility] INVASIVE CV LAB;  Service: Cardiovascular;  Laterality: N/A;  . KNEE SURGERY  01/17/2007   right  . LEFT HEART CATH AND CORONARY ANGIOGRAPHY N/A 04/18/2019    Procedure: LEFT HEART CATH AND CORONARY ANGIOGRAPHY;  Surgeon: Marykay Lex, MD;  Location: [facility] INVASIVE CV LAB;  Service: Cardiovascular;  Laterality: N/A;  . SACROILIAC JOINT FUSION Right 03/28/2014   Procedure: SACROILIAC JOINT FUSION;  Surgeon: Emilee Hero, MD;  Location[facility] OR;  Service: Orthopedics;  Laterality: Right;  Right sided sacroliac joint fusion  . TONSILLECTOMY       Physical Exam: Blood pressure 136/80, pulse (!) 57, temperature (!) 97.4 F (36.3 C), temperature source Tympanic, height 5\' 4"  (1.626 m), weight 138 lb 8 oz (62.8 kg), SpO2 98 %. Gen:      No acute distress ENT:  no nasal polyps, mucus membranes moist Lungs:    No increased respiratory effort, symmetric chest wall excursion, clear to auscultation bilaterally, no wheezes or crackles CV:         Regular rate and rhythm; no murmurs, rubs, or gallops.  No pedal edema Abd:      + bowel sounds; soft, non-tender; no distension MSK: no acute synovitis of DIP or PIP joints, no mechanics hands.  Skin:      Warm and dry; no rashes Neuro: normal speech, no focal facial asymmetry Psych: alert and oriented x3, normal mood and affect   Data Reviewed/Medical Decision Making:  Independent interpretation of tests: Imaging: . Review of patient's chest xray October 2017 images revealed no acute cardiopulmonary process. CT coronary 2018 lung windows reviewed - no pulmonary parenchymal process. The patient's images have been independently reviewed by me.    PFTs: Spirometry 2018 showed moderate airflow limitation, no BD response performed  Labs:  Lab Results  Component Value Date   WBC 5.0 02/21/2020   HGB 11.6 02/21/2020   HCT 35.5 02/21/2020   MCV 93 02/21/2020   PLT 122 (L) 02/21/2020   Lab Results  Component Value Date   NA 141 02/21/2020   K 4.7 02/21/2020   CL 103 02/21/2020   CO2 28 02/21/2020     Immunization status:  Immunization History  Administered Date(s) Administered  . Influenza  Split 12/28/2016, 01/11/2018  . Influenza, High Dose Seasonal PF 01/03/2019  . Influenza, Quadrivalent, Recombinant, Inj, Pf 12/31/2016  . Influenza,inj,Quad PF,6-35 Mos 05/03/2018  . PFIZER(Purple Top)SARS-COV-2 Vaccination 05/28/2019, 06/27/2019, 02/20/2020  . Pneumococcal Conjugate-13 05/25/2018  . Pneumococcal Polysaccharide-23 03/12/2009, 12/09/2010    . I reviewed prior external note(s) from hospital stays, cardiology . I reviewed the result(s) of the labs and imaging as noted above.  . I have ordered PFTs  Assessment:  Shortness of breath Tobacco use disorder History of PE, provoked  in the setting of back surgery s/p IVC filter  Plan/Recommendations:  Differential includes deconditioning, COPD.  Will obtain a full set of PFTs.  Continue current inhalers.   We discussed disease management and progression at length today.     Return to Care: Return in about 4 weeks (around 06/12/2020).  Durel Salts, MD Pulmonary and Critical Care Medicine [facility] Office:[phone]  CC: Molli Hazard Thomasene Ripple, NP

## 2020-05-15 NOTE — Patient Instructions (Signed)
The patient should have follow up scheduled with myself in 1-2 months.   Prior to next visit patient should have: Full set of PFTs

## 2020-09-19 ENCOUNTER — Other Ambulatory Visit: Payer: Self-pay

## 2020-09-19 MED ORDER — METOPROLOL TARTRATE 25 MG PO TABS
25.0000 mg | ORAL_TABLET | Freq: Two times a day (BID) | ORAL | 1 refills | Status: DC
Start: 2020-09-19 — End: 2020-10-21

## 2020-10-21 ENCOUNTER — Encounter: Payer: Self-pay | Admitting: Cardiovascular Disease

## 2020-10-21 ENCOUNTER — Ambulatory Visit (INDEPENDENT_AMBULATORY_CARE_PROVIDER_SITE_OTHER): Payer: Medicare Other | Admitting: Cardiovascular Disease

## 2020-10-21 ENCOUNTER — Other Ambulatory Visit: Payer: Self-pay

## 2020-10-21 VITALS — BP 156/68 | HR 51 | Ht 64.0 in | Wt 133.8 lb

## 2020-10-21 DIAGNOSIS — I10 Essential (primary) hypertension: Secondary | ICD-10-CM

## 2020-10-21 DIAGNOSIS — I25118 Atherosclerotic heart disease of native coronary artery with other forms of angina pectoris: Secondary | ICD-10-CM

## 2020-10-21 DIAGNOSIS — Z720 Tobacco use: Secondary | ICD-10-CM

## 2020-10-21 DIAGNOSIS — F419 Anxiety disorder, unspecified: Secondary | ICD-10-CM

## 2020-10-21 DIAGNOSIS — K219 Gastro-esophageal reflux disease without esophagitis: Secondary | ICD-10-CM

## 2020-10-21 DIAGNOSIS — E785 Hyperlipidemia, unspecified: Secondary | ICD-10-CM

## 2020-10-21 DIAGNOSIS — J45909 Unspecified asthma, uncomplicated: Secondary | ICD-10-CM

## 2020-10-21 DIAGNOSIS — F32A Depression, unspecified: Secondary | ICD-10-CM

## 2020-10-21 MED ORDER — NITROGLYCERIN 0.4 MG SL SUBL: 0.4000 mg | SUBLINGUAL_TABLET | SUBLINGUAL | 0 refills | Status: DC | PRN

## 2020-10-21 MED ORDER — METOPROLOL TARTRATE 25 MG PO TABS: 25.0000 mg | ORAL_TABLET | Freq: Two times a day (BID) | ORAL | 3 refills | Status: DC

## 2020-10-21 MED ORDER — ISOSORBIDE MONONITRATE ER 30 MG PO TB24: 45.0000 mg | ORAL_TABLET | Freq: Every day | ORAL | 3 refills | Status: DC

## 2020-10-21 NOTE — Patient Instructions (Addendum)
Medication Instructions:  INCREASE Imdur (isosorbide mononitrate) to 45mg  (1.5 tablets) daily.   Metoprolol tartrate refilled  Nitroglycerin refilled.   *If you need a refill on your cardiac medications before your next appointment, please call your pharmacy*   Lab Work: CBC, Cmet, Lipid, TSH in about 2 months, before next appointment.     Testing/Procedures: Your physician has requested that you have an echocardiogram. Echocardiography is a painless test that uses sound waves to create images of your heart. It provides your doctor with information about the size and shape of your heart and how well your heart's chambers and valves are working. This procedure takes approximately one hour. There are no restrictions for this procedure.    Follow-Up: At [facility], you and your health needs are our priority.  As part of our continuing mission to provide you with exceptional heart care, we have created designated Provider Care Teams.  These Care Teams include your primary Cardiologist (physician) and Advanced Practice Providers (APPs -  Physician Assistants and Nurse Practitioners) who all work together to provide you with the care you need, when you need it.  We recommend signing up for the patient portal called "MyChart".  Sign up information is provided on this After Visit Summary.  MyChart is used to connect with patients for Virtual Visits (Telemedicine).  Patients are able to view lab/test results, encounter notes, upcoming appointments, etc.  Non-urgent messages can be sent to your provider as well.   To learn more about what you can do with MyChart, go to [facility].    Your next appointment:   2 month(s)  The format for your next appointment:   In Person  Provider:   [email or web address], MD

## 2020-10-21 NOTE — Progress Notes (Signed)
Patient ID: Judith Stevens, female   DOB: [birth year], 80 y.o.   MRN: [id number]     HPI: Judith Stevens is a 80 y.o. female who presents for a 9 month follow-up cardiology evaluation.   [name] has CAD and in 2001 underwent cardiac catheterization while in [place] which showed a 60% ostial narrowing of her LAD. Repeat catheterization by me in 2009 did not show any significant interval change. Unfortunately, she has continued her ongoing tobacco habit but is now smoking significantly reduced at less than one quarter pack per day.  She has a history of hypertension as well as hyperlipidemia.  A nuclear perfusion study in August 2013 which was unchanged from 2 years previously and continued to show normal perfusion.   In December 2015 prior to undergoing right hip surgery a 2 year follow-up nuclear perfusion study on 03/13/2014 was performed preoperatively.  This continued to show normal perfusion without scar or ischemia.  She tolerated her hip surgery well.  She had smoked for 50 years before finally quitting smoking when I had seen her last.  When I last saw he 2 months ago, she had complained of experiencing increasing shortness of breath of 6 weeks duration that was not consistently exertionally precipitated.   She admitted to fatigue.  She is unaware of palpitations.  She has hyperlipidemia and has been taking atorvastatin 80 mg and Zetia 10 mg in attempt to continue to induce plaque regression.    An echo Doppler study on 02/18/2015 showed an EF of 55-60%, moderate thickening and calcification of a trileaflet aortic valve, consistent with sclerosis but without stenosis and mitral annular calcification.  Laboratory revealed excellent lipid studies with a total cholesterol 149, triglycerides 55, HDL 85 and her LDL was now 53.  Thyroid function studies were normal.  Her renal function was excellent.  LFTs were normal.  Vitamin D was normal at 41 and her vitamin B-12 level was normal at  448.    She was seen by [name] in October 2017 with complaints of dyspnea on exertion.  She has a history of asthma as well as numerous allergies to foods and some drugs.  She tells me she had the flu in early January 2018.  When I last saw her in January 2018.  She had stop smoking.  She did have complaints of dyspnea.  I recommended a follow-up echo Doppler study which was done on 04/29/2016.  This showed normal LV function and normal diastolic parameters.  The aortic valve was mildly calcified and was without stenosis.  There was mild aortic insufficiency.  Her mitral valve annulus was calcified and leaflets were mildly thickened.  Her left atrium was mildly dilated.   When I saw her in November 2018 she had been taking caring for her sister who had undergone aortic stent grafting.  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  When I saw her, I recommended complete smoking cessation and suggested a pulmonary evaluation with Dr. Curt Jews.   She underwent a coronary CT, which showed a calcium score of 81 which was 12 percentile for age and sex matched control.  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  There is right dominance.  There was diffuse calcified plaque in the RCA and proximal LAD with narrowing of 25-50%.  7.  She underwent FFR analysis with both lesions being in the normal FFR range  of greater than 0.80.  She has significantly reduced her tobacco and now smokes very intermittently.   I saw her in May 04, 2017 currently at that time it appeared that she was on both low-dose amlodipine in addition to diltiazem.  I discontinued amlodipine and further titrated diltiazem.  Over the past year she has done fairly well.  She had seen Mammie Russian, PA C in August 2019.  She had experienced occasional twinges of chest pain.  He had undergone cataract surgery.  I saw her in January 2020  in the office and  in April 2020 in a telemedicine visit.  At that time, she was tolerating olmesartan 20 mg with her diltiazem for blood pressure control.  There was concerns of GI issues and she is in need for a colonoscopy.  She had been evaluated by Dr. Benson Norway and this had been deferred with the COVID-19 pandemic.    When I saw her in September 2020 she denied any chest pain PND orthopnea.  Her blood pressure had been stable typically running in the 120s to 140s at home.  Recently, her sister died with cardiogenic shock and sepsis.  She has seen Dr. Verlin Fester for allergies.  During that evaluation, her blood pressure was elevated at 150/64.  She had been under increased stress particularly with her sister's death.  I recommended further titration of olmesartan to 30 mg she was continue to take diltiazem CD 300 mg daily.  I recommended that if her blood pressure consistently stayed above 188 systolically further titration to olmesartan 40 mg would be necessary.  I saw Ms. Koepke in the office on April 14, 2019.  Over the last several months she had felt fairly well but for the past 6 to 8 weeks she has noticed more shortness of breath than she had experienced previously.  Approximately 3 weeks ago, she had an episode of chest tightness which resolved.  She denies any significant chest pain since that time and denies any nocturnal symptoms.  She has experienced shortness of breath with activity and has taken a terbutaline tablet which she had had and noted improvement.  She denies any PND orthopnea.  She denies any nocturnal chest tightness.  During her evaluation, her ECG showed sinus rhythm at 70 bpm with an isolated PVC and new inferior and anterolateral T wave inversion of 3 to 5 mm in leads II 3 and F, V4 through V6.  During that evaluation I discussed with her my concern for her change in ECG with new T wave inversion.  She told me she had not had an episode of chest pain for over 3 weeks.  However she  was noticing shortness of breath with less activity.  I discussed options including admission to the hospital on that day versus adding additional medication to her regimen.  At the time she preferred that I do her cardiac catheterization.  I added isosorbide 30 mg daily to her medical regimen as well as metoprolol tartrate 12.5 mg twice daily I recommended reinstitution of aspirin which she had discontinued.  She continues to be on atorvastatin 80 mg daily, olmesartan 30 mg for blood pressure.  She denied any nocturnal symptoms.  I recommended that she return to the office today April 17, 2019 for reassessment of the effects of her medical therapy and repeat ECG.  Over the weekend, she actually has felt significantly better and denies any chest tightness or shortness of breath.  However, her EKG in the office today now  shows sinus bradycardia at 51 bpm with more pronounced T wave abnormality of approximately 10 mm in V3 through V6 in addition to 4 mm inferiorly.  As result, admission to the hospital was recommended and she was admitted with plans to institute heparinization and as long as she is stable plan cardiac catheterization tomorrow or later today if symptoms change.  She underwent successful cardiac catheterization on April 18, 2019 by Dr. Ellyn Hack.  She was found to have moderate mid LAD stenosis of 55%.  In some views this appeared greater.  FFR analysis was negative at 0.85.  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  Medical therapy was recommended.  She was seen in follow-up by Coletta Memos, MD in the office on April 28, 2019 and remained fairly stable.    I saw her in April 2021.  Over the previous 3 months she continued to do well. She specifically denied any episodes of chest pain, PND, orthopnea.  She has continued to be on diltiazem 300 mg daily at bedtime, isosorbide 30 mg daily in  addition to olmesartan currently at 30 mg as well as metoprolol tartrate 25 mg twice a day.  She was unaware of any tachypalpitations.  She is sleeping well.   She continues to be on atorvastatin 80 mg and Zetia 10 mg for aggressive lipid management.  GERD is controlled with pantoprazole.  Her ECG showed complete resolution of her prior markedly abnormal T wave abnormalities.  I discussed possible potential microvascular angina having benefit with ranolazine therapy which could be added in the future.  I further titrated olmesartan to 40 mg daily.  She was off tobacco since January 2021.  I last saw her in November 2021 at which time she continued to remain stable.  She was no longer smoking.  She denied chest pain.  Blood pressures were typically running in the 120-130 range at home.  She had stopped taking her aspirin on her own and I recommended reinstitution of 81 mg daily and if not daily then perhaps every other day.  With her she was being seen by Dr. Vaughan Browner for pulmonary evaluation.    Since I last saw her, she states her blood pressure typically has been running in the 1 20-1 30 range and rarely has gone into the 140s.  Her blood pressure yesterday at home was 117/57.  She has tried to reduce her sodium intake but still does get sodium on occasion.  She is currently drinking at least 5 to 6 cups of caffeinated beverage per day.  She is unaware of palpitations.  She denies recurrent anginal symptomatology.  She has not had recent laboratory.  She is scheduled for follow-up pulmonary evaluation in several weeks.  She presents for a 13-monthcardiology follow-up assessment.   Past Medical History:  Diagnosis Date   Anxiety    Aortic valve stenosis    Arthritis    Asthma    CAD (coronary artery disease)    single vessel   COPD (chronic obstructive pulmonary disease) (HCC)    GERD (gastroesophageal reflux disease)    Headache    History of stress test 10/2011   Resting images reveal a normal  pattern of perfusion in all regions. The post stress myocardial perfusion images show a normal pattern of perfusion in all region. The post stress left ventricle is normal size. the rest left ventricle is normal size, there  is no scintigraphic evidence of inductible myocardial ischemia.   Hx of echocardiogram 09/2009   EF >55% Normal Echo   Hyperlipidemia    Hypertension    Migraines    Pneumonia    hx of   Pulmonary emboli (HCC) 5/12   Shortness of breath dyspnea    exertion or eating something shes allergic too   Tobacco abuse     Past Surgical History:  Procedure Laterality Date   ADENOIDECTOMY     BACK SURGERY  2012   CARDIAC CATHETERIZATION  10/13/2007   60% narrowing twin LAD system   DILATION AND CURETTAGE OF UTERUS     FOOT SURGERY  2002,2013   right   INTRAVASCULAR PRESSURE WIRE/FFR STUDY N/A 04/18/2019   Procedure: INTRAVASCULAR PRESSURE WIRE/FFR STUDY;  Surgeon: Leonie Man, MD;  Location: [facility];  Service: Cardiovascular;  Laterality: N/A;   KNEE SURGERY  01/17/2007   right   LEFT HEART CATH AND CORONARY ANGIOGRAPHY N/A 04/18/2019   Procedure: LEFT HEART CATH AND CORONARY ANGIOGRAPHY;  Surgeon: Leonie Man, MD;  Location: [facility];  Service: Cardiovascular;  Laterality: N/A;   SACROILIAC JOINT FUSION Right 03/28/2014   Procedure: SACROILIAC JOINT FUSION;  Surgeon: Sinclair Ship, MD;  Location: [facility];  Service: Orthopedics;  Laterality: Right;  Right sided sacroliac joint fusion   TONSILLECTOMY      Allergies  Allergen Reactions   Cefzil [Cefprozil] Shortness Of Breath   Keflex [Cephalexin] Shortness Of Breath and Rash   Lactose Intolerance (Gi) Shortness Of Breath    ALL DAIRY PRODUCTS   Levaquin [Levofloxacin] Shortness Of Breath and Rash   Naproxen Shortness Of Breath    Shortness of breath Shortness of breath   Oxycodone-Acetaminophen Nausea And Vomiting and Shortness Of Breath   Peanuts [Peanut Oil] Shortness Of Breath    Penicillins Shortness Of Breath   Percocet [Oxycodone-Acetaminophen] Shortness Of Breath   Red Dye Shortness Of Breath   Sulfa Antibiotics Shortness Of Breath   Sulfacetamide Sodium Shortness Of Breath   Tetracycline Hcl Shortness Of Breath   Tetracyclines & Related Shortness Of Breath   Wheat Bran Shortness Of Breath   Methocarbamol Nausea And Vomiting   Prednisone Other (See Comments)    Rash   Tomato Other (See Comments)    SOB   Amoxicillin-Pot Clavulanate Rash   Ibuprofen Rash   Moxifloxacin Rash   Penicillin G Rash   Sulfamethoxazole-Trimethoprim Rash   Tetracycline Rash   Tramadol Rash    Current Outpatient Medications  Medication Sig Dispense Refill   ALPRAZolam (XANAX) 1 MG tablet Take 1 mg by mouth 3 (three) times daily as needed for anxiety.      atorvastatin (LIPITOR) 80 MG tablet Take 1 tablet (80 mg total) by mouth daily. 90 tablet 3   butalbital-acetaminophen-caffeine (FIORICET) 50-325-40 MG tablet Take 1 tablet by mouth every 6 (six) hours as needed.     Cholecalciferol (VITAMIN D3) 2000 UNITS TABS Take 2,000 Units by mouth daily.      diltiazem (CARDIZEM CD) 300 MG 24 hr capsule Take 1 capsule (300 mg total) by mouth at bedtime. 90 capsule 3   escitalopram (LEXAPRO) 20 MG tablet Take 1 tablet (20 mg total) by mouth daily. 90 tablet 0   ezetimibe (ZETIA) 10 MG tablet Take 1 tablet (10 mg total) by mouth at bedtime. 90 tablet 3   fluticasone (FLONASE) 50 MCG/ACT nasal spray 2 sprays by Each Nare route daily.  Fluticasone-Salmeterol (ADVAIR) 250-50 MCG/DOSE AEPB Inhale 1 puff into the lungs 2 (two) times daily.     olmesartan (BENICAR) 40 MG tablet Take 1 tablet (40 mg total) by mouth daily. 90 tablet 3   ondansetron (ZOFRAN) 4 MG tablet Take 4 mg by mouth every 8 (eight) hours as needed for nausea.     pantoprazole (PROTONIX) 40 MG tablet Take 40 mg by mouth 2 (two) times daily.      terbutaline (BRETHINE) 5 MG tablet Take 5 mg by mouth every 6 (six) hours as  needed (asthma).      VENTOLIN HFA 108 (90 Base) MCG/ACT inhaler Inhale 2 puffs into the lungs every 6 (six) hours as needed for wheezing.   11   aspirin EC 81 MG tablet Take 81 mg by mouth daily. (Patient not taking: Reported on 10/21/2020)     budesonide (PULMICORT) 0.5 MG/2ML nebulizer solution USE AS DIRECTED FOR NASAL RINSE (Patient not taking: Reported on 10/21/2020) 120 mL 0   isosorbide mononitrate (IMDUR) 30 MG 24 hr tablet Take 1.5 tablets (45 mg total) by mouth daily. 135 tablet 3   metoprolol tartrate (LOPRESSOR) 25 MG tablet Take 1 tablet (25 mg total) by mouth 2 (two) times daily. 180 tablet 3   Multiple Vitamin (MULTI-VITAMIN DAILY PO) Take 1 tablet by mouth daily.     nitroGLYCERIN (NITROSTAT) 0.4 MG SL tablet Place 1 tablet (0.4 mg total) under the tongue every 5 (five) minutes as needed for chest pain. Needs appointment for future refills 25 tablet 0   No current facility-administered medications for this visit.    Social History   Socioeconomic History   Marital status: Widowed    Spouse name: Not on file   Number of children: Not on file   Years of education: Not on file   Highest education level: Not on file  Occupational History   Not on file  Tobacco Use   Smoking status: Some Days    Packs/day: 1.00    Types: Cigarettes    Start date: 01/31/1959    Last attempt to quit: 04/15/2019    Years since quitting: 1.5   Smokeless tobacco: Former    Quit date: 01/29/2014  Vaping Use   Vaping Use: Never used  Substance and Sexual Activity   Alcohol use: Yes    Alcohol/week: 4.0 standard drinks    Types: 2 Glasses of wine, 2 Cans of beer per week   Drug use: No   Sexual activity: Not on file  Other Topics Concern   Not on file  Social History Narrative   Not on file   Social Determinants of Health   Financial Resource Strain: Not on file  Food Insecurity: Not on file  Transportation Needs: Not on file  Physical Activity: Not on file  Stress: Not on file   Social Connections: Not on file  Intimate Partner Violence: Not on file    Family History  Problem Relation Age of Onset   Heart attack Mother    Diabetes Mother    Asthma Mother    Heart attack Father    Diabetes Sister    Hyperlipidemia Sister    Allergic rhinitis Neg Hx    Angioedema Neg Hx    Eczema Neg Hx    Immunodeficiency Neg Hx    Urticaria Neg Hx    Socially, she is widowed. She has 2 children 5 grandchildren. She does not exercise. She quit smoking 2 years ago. resumed on May 12 after  her dog died.  She is committed to completely discontinue tobacco.   ROS General: Negative; No fevers, chills, or night sweats;  HEENT: Negative; No changes in vision or hearing, sinus congestion, difficulty swallowing Pulmonary: Positive for asthma Cardiovascular: Negative; No chest pain, presyncope, syncope, palpitations GI: positive for GERD; No nausea, vomiting, diarrhea, or abdominal pain GU: Negative; No dysuria, hematuria, or difficulty voiding Musculoskeletal: She tolerated her hip surgery well. Hematologic/Oncology: Negative; no easy bruising, bleeding Endocrine: Negative; no heat/cold intolerance; no diabetes Neuro: Negative; no changes in balance, headaches Skin: Negative; No rashes or skin lesions Psychiatric: Negative; No behavioral problems, depression Sleep: Negative; No snoring, daytime sleepiness, hypersomnolence, bruxism, restless legs, hypnogognic hallucinations, no cataplexy Other comprehensive 14 point system review is negative.  Nutritionally she cannot have dairy products due to significant milk allergy.  PE BP (!) 156/68 (BP Location: Left Arm, Patient Position: Sitting, Cuff Size: Normal)   Pulse (!) 51   Ht '5\' 4"'  (1.626 m)   Wt 133 lb 12.8 oz (60.7 kg)   LMP  (LMP Unknown)   SpO2 98%   BMI 22.97 kg/m    Repeat blood pressure by me was 156/70 in the right arm and 158/70 in the left arm  Wt Readings from Last 3 Encounters:  10/21/20 133 lb 12.8 oz  (60.7 kg)  05/15/20 138 lb 8 oz (62.8 kg)  02/26/20 138 lb (62.6 kg)   General: Alert, oriented, no distress.  Skin: normal turgor, no rashes, warm and dry HEENT: Normocephalic, atraumatic. Pupils equal round and reactive to light; sclera anicteric; extraocular muscles intact;  Nose without nasal septal hypertrophy Mouth/Parynx benign; Mallinpatti scale 3 Neck: No JVD, no carotid bruits; normal carotid upstroke Lungs: clear to ausculatation and percussion; no wheezing or rales Chest wall: without tenderness to palpitation Heart: PMI not displaced, RRR, s1 s2 normal, 1/6 systolic murmur, no diastolic murmur, no rubs, gallops, thrills, or heaves Abdomen: soft, nontender; no hepatosplenomehaly, BS+; abdominal aorta nontender and not dilated by palpation. Back: no CVA tenderness Pulses 2+ Musculoskeletal: full range of motion, normal strength, no joint deformities Extremities: no clubbing cyanosis or edema, Homan's sign negative  Neurologic: grossly nonfocal; Cranial nerves grossly wnl Psychologic: Normal mood and affect   ECG (independently read by me): Sinus bradycardia 51 bpm.  No ectopy.  Normal intervals.  He said he is almost  November 2021 ECG (independently read by me): Normal sinus rhythm at 60 bpm.  Poor R wave progression V1 through V3.  Normal intervals.  No ectopy.  April 2021 ECG (independently read by me): Sinus bradycardia 51 bpm.  Marked improvement in prior T wave abnormality with minimal nonspecific ST change in lead III.     April 17, 2019 ECG (independently read by me): Sinus bradycardia 51 bpm.  LVH by voltage criteria.  More deeply inverted inferolateral T wave inversion 8 to 10 mm in the anterolateral leads in 2 to 4 mm inferiorly  April 14, 2019 ECG (independently read by me): Normal sinus rhythm at 70 bpm.  New inferior and anterolateral T wave inversion.  PR interval 170 ms, QTc interval 444 ms; an isolated PVC  September 2020 ECG (independently read by me):  Sinus bradycardia at 58 bpm.  Nonspecific ST changes.  Poor R wave progression V1 through V3  January 2020 ECG (independently read by me): Normal sinus rhythm at 61 bpm.  No ectopy.  Specific ST change in lead III.  February 2019 ECG (independently read by me): Normal sinus rhythm at 72  bpm.  Nonspecific ST changes inferiorly.  QTc interval 451 ms.  June 2018 ECG (independently read by me): Sinus bradycardia 57 bpm.  No significant ST-T changes.  Normal intervals.  January 2018 ECG (independently read by me): Normal sinus rhythm at 69 bpm.  No ectopy.  Normal intervals.  November 2016 ECG (independently read by me): Normal sinus rhythm at 60 bpm.  No significant ST-T changes.  Normal intervals.  November 2015 ECG (independently read by me): Normal sinus rhythm at 72 bpm.  Normal intervals.  No significant ST-T changes.  October 2014 ECG: Sinus rhythm with occasional isolated PVC. Heart rate 68 beats per minute. QTc interval 440 ms.  LAB: BMP Latest Ref Rng & Units 02/21/2020 04/18/2019 04/17/2019  Glucose 65 - 99 mg/dL 105(H) 101(H) 156(H)  BUN 8 - 27 mg/dL '12 15 14  ' Creatinine 0.57 - 1.00 mg/dL 0.69 0.58 0.62  BUN/Creat Ratio 12 - 28 17 - -  Sodium 134 - 144 mmol/L 141 140 141  Potassium 3.5 - 5.2 mmol/L 4.7 3.8 4.5  Chloride 96 - 106 mmol/L 103 106 103  CO2 20 - 29 mmol/L '28 25 30  ' Calcium 8.7 - 10.3 mg/dL 9.1 8.6(L) 9.2   Hepatic Function Latest Ref Rng & Units 12/09/2018 11/03/2017 02/08/2017  Total Protein 6.0 - 8.5 g/dL 6.7 6.1 6.6  Albumin 3.7 - 4.7 g/dL 4.9(H) 4.5 4.7  AST 0 - 40 IU/L '21 16 19  ' ALT 0 - 32 IU/L '26 22 18  ' Alk Phosphatase 39 - 117 IU/L 63 67 78  Total Bilirubin 0.0 - 1.2 mg/dL 0.3 0.2 0.3   CBC Latest Ref Rng & Units 02/21/2020 04/18/2019 04/17/2019  WBC 3.4 - 10.8 x10E3/uL 5.0 4.6 4.8  Hemoglobin 11.1 - 15.9 g/dL 11.6 11.3(L) 11.9(L)  Hematocrit 34.0 - 46.6 % 35.5 33.4(L) 35.8(L)  Platelets 150 - 450 x10E3/uL 122(L) 123(L) 132(L)   Lab Results  Component  Value Date   MCV 93 02/21/2020   MCV 92.3 04/18/2019   MCV 93.7 04/17/2019   Lab Results  Component Value Date   TSH 2.150 11/03/2017   No results found for: HGBA1C   Lipid Panel     Component Value Date/Time   CHOL 159 12/09/2018 0931   TRIG 46 12/09/2018 0931   HDL 81 12/09/2018 0931   CHOLHDL 2.0 12/09/2018 0931   CHOLHDL 2.0 04/27/2016 0923   VLDL 10 04/27/2016 0923   LDLCALC 68 12/09/2018 0931    RADIOLOGY: No results found.  IMPRESSION:  1. Essential hypertension   2. Coronary artery disease involving native coronary artery of native heart with other form of angina pectoris ([place])   3. Hyperlipidemia LDL goal <70   4. Prior tobacco abuse   5. Mild asthma without complication, unspecified whether persistent   6. Anxiety and depression   7. Gastroesophageal reflux disease without esophagitis     ASSESSMENT AND PLAN: Ms. Kosek is a 80 year-old female who had smoked for over 50 years and quit smoking over 2 years ago.  Unfortunately, she was under significant stress with the  death of her dog and resumed smoking in May 2018 continued to smoke intermittently but  again quit smoking in March 2020.  Remotely she was found to have she has CAD with a 60% ostial LAD stenosis dating back to 2001 and which again was noted on her repeat catheterization by me in 2009.  We have been aggressively treating her lipids in attempt to induce plaque regression and she has  tolerated Lipitor 80 mg in addition to Zetia 10 mg.  Laboratory on December 09, 2018 showed a total cholesterol 159, triglycerides 46, HDL 81, and LDL cholesterol at 68.  A previous coronary CT angiography in December 2018 did not demonstrate significant progression of disease and showed 25-50% stenoses in the LAD and RCA; FFR was negative.  I had seen her in early 2020 and had instituted olmesartan due to continued blood pressure elevation.  She had experienced increased stress at the time of her last evaluation in  January 13, 2019 due to her sister's death.  At that time her blood pressure was elevated and I further titrated olmesartan.  She resumed tobacco use.  When I had seen her in January 2021 she had experienced more episodes of shortness of breath with activity and she developed new inferolateral T wave inversion worrisome for ischemia.  Due to progressive ECG changes she ultimately was hospitalized and catheterization revealed bifurcation narrowing in a twin like LAD vessel with narrowing of at least 55% or greater in the more inferior branch.  FFR analysis was negative.  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  She has since quit smoking in January.  With discontinuance of tobacco, I believe this is had significant benefit particularly with reference to endothelial dysfunction and potential nicotine mediated vasospasm.  She continues to have some blood pressure lability, but she states at home her blood pressure is typically in the normal range yesterday was 117/57.  She has still been eating some salt but markedly less than previously.  She also has been having 5 to 6 cups of caffeinated beverage per day.  I have recommended reduction of both sodium intake and caffeine.  She is not having anginal symptomatology.  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  She is on diltiazem 300 mg daily in addition to metoprolol tartrate 25 mg twice a day which are playing a role in her bradycardia.  She continues to be on olmesartan 40 mg and is tolerating this.  With her blood pressure lability and her known CAD I have recommended slight titration of her isosorbide from 30 mg up to 45 mg daily.  She continues to be on atorvastatin 80 mg and Zetia 10 mg for hyperlipidemia.  Lipid studies when last checked that showed an LDL cholesterol at 68.  She continues to be on pantoprazole for GERD.  She is tolerating terbutaline in addition to Ventolin inhaler and Pulmicort for her lung disease and is followed by pulmonary.   She will be seeing Patricia Nettle, nurse practitioner in several weeks.  I have recommended she undergo a follow-up echo Doppler study particularly with her blood pressure lability.  Her last echo study was done in 2016.  In 2 months I recommended a complete set of fasting laboratory she will continue to monitor her blood pressure.  She continues to be on escitalopram 20 mg for anxiety and depression.  I will see her in 3 months for reevaluation or sooner as needed.    Troy Sine, MD, [facility]  10/21/2020 3:43 PM

## 2020-11-04 ENCOUNTER — Ambulatory Visit: Payer: Medicare Other | Admitting: Adult Health

## 2020-11-07 ENCOUNTER — Ambulatory Visit (HOSPITAL_COMMUNITY): Payer: Medicare Other | Attending: Cardiology

## 2020-11-07 ENCOUNTER — Other Ambulatory Visit: Payer: Self-pay

## 2020-11-07 DIAGNOSIS — I10 Essential (primary) hypertension: Secondary | ICD-10-CM

## 2020-11-07 DIAGNOSIS — I25118 Atherosclerotic heart disease of native coronary artery with other forms of angina pectoris: Secondary | ICD-10-CM | POA: Diagnosis not present

## 2020-11-07 LAB — ECHOCARDIOGRAM COMPLETE
AR max vel: 2.41 cm2
AV Area VTI: 2.1 cm2
AV Area mean vel: 2.08 cm2
AV Mean grad: 8 mmHg
AV Peak grad: 13 mmHg
Ao pk vel: 1.81 m/s
Area-P 1/2: 2.29 cm2
P 1/2 time: 772 msec
S' Lateral: 3.1 cm

## 2021-01-06 ENCOUNTER — Ambulatory Visit: Payer: Medicare Other | Admitting: Cardiovascular Disease

## 2021-01-10 ENCOUNTER — Ambulatory Visit: Payer: Medicare Other | Admitting: Internal Medicine

## 2021-01-23 ENCOUNTER — Other Ambulatory Visit: Payer: Self-pay

## 2021-01-23 ENCOUNTER — Encounter: Payer: Self-pay | Admitting: Pulmonary Disease

## 2021-01-23 ENCOUNTER — Ambulatory Visit (INDEPENDENT_AMBULATORY_CARE_PROVIDER_SITE_OTHER): Payer: Medicare Other

## 2021-01-23 ENCOUNTER — Ambulatory Visit (INDEPENDENT_AMBULATORY_CARE_PROVIDER_SITE_OTHER): Payer: Medicare Other | Admitting: Pulmonary Disease

## 2021-01-23 VITALS — BP 122/74 | HR 57 | Ht 64.0 in | Wt 128.0 lb

## 2021-01-23 DIAGNOSIS — I10 Essential (primary) hypertension: Secondary | ICD-10-CM

## 2021-01-23 DIAGNOSIS — I25118 Atherosclerotic heart disease of native coronary artery with other forms of angina pectoris: Secondary | ICD-10-CM

## 2021-01-23 DIAGNOSIS — R0602 Shortness of breath: Secondary | ICD-10-CM | POA: Diagnosis not present

## 2021-01-23 DIAGNOSIS — F17210 Nicotine dependence, cigarettes, uncomplicated: Secondary | ICD-10-CM | POA: Diagnosis not present

## 2021-01-23 DIAGNOSIS — J449 Chronic obstructive pulmonary disease, unspecified: Secondary | ICD-10-CM

## 2021-01-23 DIAGNOSIS — F17200 Nicotine dependence, unspecified, uncomplicated: Secondary | ICD-10-CM

## 2021-01-23 DIAGNOSIS — F419 Anxiety disorder, unspecified: Secondary | ICD-10-CM

## 2021-01-23 DIAGNOSIS — F32A Depression, unspecified: Secondary | ICD-10-CM

## 2021-01-23 LAB — CBC WITH DIFFERENTIAL/PLATELET
Basophils Absolute: 0 10*3/uL (ref 0.0–0.1)
Basophils Relative: 0.6 % (ref 0.0–3.0)
Eosinophils Absolute: 0.1 10*3/uL (ref 0.0–0.7)
Eosinophils Relative: 1.9 % (ref 0.0–5.0)
HCT: 37.9 % (ref 36.0–46.0)
Hemoglobin: 12.5 g/dL (ref 12.0–15.0)
Lymphocytes Relative: 11.9 % — ABNORMAL LOW (ref 12.0–46.0)
Lymphs Abs: 0.9 10*3/uL (ref 0.7–4.0)
MCHC: 33.1 g/dL (ref 30.0–36.0)
MCV: 94.5 fl (ref 78.0–100.0)
Monocytes Absolute: 0.4 10*3/uL (ref 0.1–1.0)
Monocytes Relative: 5.4 % (ref 3.0–12.0)
Neutro Abs: 6.3 10*3/uL (ref 1.4–7.7)
Neutrophils Relative %: 80.2 % — ABNORMAL HIGH (ref 43.0–77.0)
Platelets: 147 10*3/uL — ABNORMAL LOW (ref 150.0–400.0)
RBC: 4.01 Mil/uL (ref 3.87–5.11)
RDW: 13.9 % (ref 11.5–15.5)
WBC: 7.8 10*3/uL (ref 4.0–10.5)

## 2021-01-23 NOTE — Assessment & Plan Note (Signed)
50-pack-year smoking history Recently bought a pack of cigarettes the other day, she reports this will last around 2 weeks  Plan: Discussed at length the need to stop smoking Pulmonary function testing ordered

## 2021-01-23 NOTE — Assessment & Plan Note (Signed)
Plan: Continue Lexapro Schedule follow-up with primary care regarding management of chronic anxiety and depression Will defer additional refills for benzodiazepines to primary care

## 2021-01-23 NOTE — Patient Instructions (Signed)
You were seen today by Coral Ceo, NP  for:   1. Chronic obstructive pulmonary disease, unspecified COPD type (HCC) 2. Shortness of breath  Walk today in office-this was stable you are able to complete 1 lap without any oxygen desaturations or fast heart rate.  This is good news  Lab work today  Chest x-ray today  Do not leave our office today without scheduling pulmonary function testing and your follow-up visit  3. Current smoker  We strongly recommend that you stop smoking completely as this is a contributor to your overall health as well as shortness of breath see the information listed below  4. Primary hypertension 5. Coronary artery disease involving native coronary artery of native heart with other form of angina pectoris [facility])  Reschedule follow-up with cardiology  6. Anxiety and depression  Schedule follow-up with primary care as discussed at her office visit today   We recommend today:  Orders Placed This Encounter  Procedures   DG Chest 2 View    Standing Status:   Future    Number of Occurrences:   1    Standing Expiration Date:   05/26/2021    Order Specific Question:   Reason for Exam (SYMPTOM  OR DIAGNOSIS REQUIRED)    Answer:   doe    Order Specific Question:   Preferred imaging location?    Answer:   Internal    Order Specific Question:   Radiology Contrast Protocol - do NOT remove file path    Answer:   [email or web address]   Orders Placed This Encounter  Procedures   DG Chest 2 View   No orders of the defined types were placed in this encounter.   Follow Up:    Return in about 4 weeks (around 02/20/2021), or if symptoms worsen or fail to improve, for Follow up for FULL PFT - 60 min, Follow up with Dr. Celine Mans.   Notification of test results are managed in the following manner: If there are  any recommendations or changes to the  plan of care discussed in office today,  we will contact you and  let you know what they are. If you do not hear from [place], then your results are normal and you can view them through your  MyChart account , or a letter will be sent to you. Thank you again for trusting [place] with your care  - Thank you, [facility]    It is flu season:   >>> Best ways to protect herself from the flu: Receive the yearly flu vaccine, practice good hand hygiene washing with soap and also using hand sanitizer when available, eat a nutritious meals, get adequate rest, hydrate appropriately       Please contact the office if your symptoms worsen or you have concerns that you are not improving.   Thank you for choosing [facility] for your healthcare, and for allowing [place] to partner with you on your healthcare journey. I am thankful to be able to provide care to you today.   Elisha Headland FNP-C    Health Risks of Smoking Smoking tobacco is very bad for your health. Tobacco smoke contains many toxic chemicals that can damage every part of your body. Secondhand smoke can be harmful to those around you. Tobacco or nicotine use can cause many long-term (chronic) diseases. Smoking is difficult to quit because a chemical in tobacco, called nicotine, causes addiction or dependence. When you smoke and inhale, nicotine is absorbed quickly into  the bloodstream through your lungs. Both inhaled and non-inhaled nicotine may be addictive. How can quitting affect me? There are health benefits of quitting smoking. Some benefits happen right away and others take time. Benefits may include: Blood flow, blood pressure, heart rate, and lung capacity may begin to improve. However, any lung damage that has already occurred cannot be repaired. Temporary respiratory symptoms, such as nasal congestion and cough, may improve over time. Your risk of heart disease, stroke, and cancer is reduced. The overall quality of your health may improve. You may save money, as you will not spend money on  tobacco products and may spend less money on smoking-related health issues. What can increase my risk? Smoking harms nearly every organ in the body. People who smoke tobacco have a shorter life expectancy and an increased risk of many serious medical problems. These include: More respiratory infections, such as colds and pneumonia. Cancer. Heart disease. Stroke. Chronic respiratory diseases. Delayed wound healing and increased risk of complications during surgery. Problems with reproduction, pregnancy, and childbirth, such as infertility, early (premature) births, stillbirths, and birth defects. Secondhand smoke exposure to children increases the risk of: Sudden infant death syndrome (SIDS). Infections in the nose, throat, or airways (respiratory infections). Chronic respiratory symptoms. What actions can I take to quit? Smoking is an addiction that affects both your body and your mind, and long-time habits can be hard to change. Your health care provider can recommend: Nicotine replacement products, such as patches, gum, and nasal sprays. Use these products only as directed. Do not replace cigarette smoking with electronic cigarettes, which are commonly called e-cigarettes. The safety of e-cigarettes is not known, and some may contain harmful chemicals. Programs and community resources, which may include group support, education, or talk therapy. Prescription medicines to help reduce cravings. A combination of two or more quit methods, which will increase the success of quitting. Where to find support Follow the recommendations from your health care provider about support groups and other assistance. You can also visit: [employer]: www.naquitline.org or call 1-800-QUIT-NOW. U.S. Department of Health and Human Services: www.smokefree.gov American Lung Association: www.freedomfromsmoking.org American Heart Association: www.heart.org Where to find more  information Centers for Disease Control and Prevention: FootballExhibition.com.br World Health Organization: https://castaneda-walker.com/ Summary Smoking tobacco is very bad for your health. Tobacco smoke contains many toxic chemicals that can damage every part of the body. Smoking is difficult to quit because a chemical in tobacco, called nicotine, causes addiction or dependence. There are immediate and long-term health benefits of quitting smoking. A combination of two or more quit methods increases the success of quitting. This information is not intended to replace advice given to you by your health care provider. Make sure you discuss any questions you have with your health care provider. Document Revised: 05/01/2019 Document Reviewed: 05/01/2019 Elsevier Patient Education  2022 [employer].

## 2021-01-23 NOTE — Assessment & Plan Note (Addendum)
Chronic dyspnea Likely multifactorial etiology Significant trigger of dyspnea is anxiety Patient with 50+ pack year smoking history Patient needs full pulmonary function testing Last chest x-ray was from 2017 Last breathing test was from 2018 with allergy asthma Patient reports adherence of terbutaline and Advair 250  Plan: Walk today in office-patient completed 1 lap, stable no oxygen desaturations on room air or tachycardia Chest x-ray today Lab work today to check CBC for hemoglobin >>> Will defer BNP as patient does not appear fluid overloaded on exam >>> Will defer additional testing for PE or DVT given clinical assessment, stable walk in office, patient with history of IVC filter placement Schedule pulmonary function testing before leaving the office Strongly recommend stopping smoking as discussed at office visit

## 2021-01-23 NOTE — Progress Notes (Signed)
@Patient  ID: , female    DOB: 10/23/1940, 80 y.o.   MRN: [id number]  Chief Complaint  Patient presents with   Follow-up    8 mo f/u for SOB. States she is still having DOE. SOB also comes when she is resting. Denies any wheezing or coughing.     Referring provider: [id number]., PA-C  HPI:  80 year old female former smoker [facility]onder office for shortness of breath  PMH: Coronary artery disease, hypertension, hyperlipidemia Smoker/ Smoking History: Current Smoker.  50+ pack year smoking history. Maintenance: Advair 250, terbutaline, as needed Ventolin Pt of: Dr. 70  01/23/2021  - Visit   80 year old female former smoker followed in our office for shortness of breath.  Last seen by Dr. 70 in February/2022.  Consult in February/2022 with Dr. March/2022 for ongoing shortness of breath.  At that moment time patient was reporting shortness of breath for over 6 months and that it was progressing.  She is a history of CAD and recent left heart cath which demonstrated some narrowing in the LAD which was not intervened due to nonsignificant physiology.  She takes terbutaline which did not help her breathing but Xanax did help her breathing.  She is over 24 allergies or intolerances to food and her medication some of which makes her breathing worse especially on a dairy.  In May 2012 she had a provoked acute PE in the setting of surgery was on warfarin.  Had IVC filter placed.  Plan of care from February consult was to return in 4 weeks.  Obtain full set of PFTs, continue inhalers.  Unfortunately pulmonary function testing that was ordered in February/2022 was never completed.  Patient reports that she is a current smoker.  She just previously bought a pack of cigarettes a few days ago this should last about 2 weeks.  She reports that she only smokes when she is driving.  She is unsure if this is contributing to her shortness of breath.  Patient does not believe that by  stopping smoking this would help her shortness of breath.  Patient reports that she is had shortness of breath for many years even prior to smoking.  She has over 50-pack-year smoking history.  Patient reports that her shortness of breath is progressively worsened.  Is present at rest as well as with physical activity.  She also attributes her shortness of breath due to masking requirements in our facility.  Patient continues to have episodes of anxiety.  She has some family stress.  Unfortunately she lost a pet earlier this year which has been difficult for her.  She has noted that when she is anxious or upset that her shortness of breath worsens.  She is due for follow-up with her primary care provider.  She would like to discuss with her primary care provider stopping Lexapro.  Patient feels that benzodiazepines are adequate treatment for her shortness of breath.  An additional stressor for the patient has not been able to eat out and prepare food due to her long list of allergies.  Unfortunately patient canceled follow-up with cardiology and has not had the lab work that was requested to be completed in September.  She is working with cardiology to have this appointment be rescheduled at this time.  She is also planning on contacting primary care she reports she is due for a wellness visit.  Of note: Allergies listed on chart the cause shortness of breath are as follows: Cefzil, Keflex, lactose, Levaquin,  naproxen, oxycodone, peanuts, penicillin, Percocet, prednisone, red dye, sulfa antibiotics, tetracycline, wheat bran, tomatoes  Questionaires / Pulmonary Flowsheets:   ACT:  No flowsheet data found.  MMRC: mMRC Dyspnea Scale mMRC Score  01/23/2021 3    Epworth:  No flowsheet data found.  Tests:   01/23/2021-walk in office-on room air-patient was able to complete 1 lap, no tachycardia, no oxygen desaturations, patient able to talk during entire walk with forehead  probe  01/24/2016-chest x-ray-no active cardiopulmonary disease  11/07/2020-echocardiogram-LV ejection fraction 60 to 65%, left ventricular internal cavity was mildly dilated, right ventricular systolic function is normal, right ventricular size is normal  08/18/2016-spirometry-FVC 2.26 (85% predicted), postbronchodilator ratio 69, FEV1 1.52 (74% predicted)  FENO:  No results found for: NITRICOXIDE  PFT: No flowsheet data found.  WALK:  SIX MIN WALK 01/23/2021  Supplimental Oxygen during Test? (L/min) No  Tech Comments: Patient was able to complete lap at a steady pace on room air. She was only able to complete 1 lap due to her legs feeling tired and she did not want to overwork herself. No O2 needed during or after walk. Denies any CP and SOB during or after walk.    Imaging: No results found.  Lab Results:  CBC    Component Value Date/Time   WBC 5.0 02/21/2020 1101   WBC 4.6 04/18/2019 0018   RBC 3.82 02/21/2020 1101   RBC 3.62 (L) 04/18/2019 0018   HGB 11.6 02/21/2020 1101   HCT 35.5 02/21/2020 1101   PLT 122 (L) 02/21/2020 1101   MCV 93 02/21/2020 1101   MCH 30.4 02/21/2020 1101   MCH 31.2 04/18/2019 0018   MCHC 32.7 02/21/2020 1101   MCHC 33.8 04/18/2019 0018   RDW 12.8 02/21/2020 1101   LYMPHSABS 1.6 04/17/2019 1617   MONOABS 0.4 04/17/2019 1617   EOSABS 0.1 04/17/2019 1617   BASOSABS 0.0 04/17/2019 1617    BMET    Component Value Date/Time   NA 141 02/21/2020 1101   K 4.7 02/21/2020 1101   CL 103 02/21/2020 1101   CO2 28 02/21/2020 1101   GLUCOSE 105 (H) 02/21/2020 1101   GLUCOSE 101 (H) 04/18/2019 0018   BUN 12 02/21/2020 1101   CREATININE 0.69 02/21/2020 1101   CREATININE 0.52 (L) 04/27/2016 0923   CALCIUM 9.1 02/21/2020 1101   GFRNONAA 84 02/21/2020 1101   GFRNONAA >89 01/24/2016 1127   GFRAA 97 02/21/2020 1101   GFRAA >89 01/24/2016 1127    BNP    Component Value Date/Time   BNP 24.4 01/24/2016 1127    ProBNP No results found for:  PROBNP  Specialty Problems       Pulmonary Problems   Dyspnea   COPD (chronic obstructive pulmonary disease) (HCC)   Other allergic rhinitis   Rhinitis medicamentosa    Allergies  Allergen Reactions   Cefzil [Cefprozil] Shortness Of Breath   Keflex [Cephalexin] Shortness Of Breath and Rash   Lactose Intolerance (Gi) Shortness Of Breath    ALL DAIRY PRODUCTS   Levaquin [Levofloxacin] Shortness Of Breath and Rash   Naproxen Shortness Of Breath    Shortness of breath Shortness of breath   Oxycodone-Acetaminophen Nausea And Vomiting and Shortness Of Breath   Peanuts [Peanut Oil] Shortness Of Breath   Penicillins Shortness Of Breath   Percocet [Oxycodone-Acetaminophen] Shortness Of Breath   Prednisone Shortness Of Breath and Other (See Comments)    Rash   Red Dye Shortness Of Breath   Sulfa Antibiotics Shortness Of Breath  Sulfacetamide Sodium Shortness Of Breath   Tetracycline Hcl Shortness Of Breath   Tetracyclines & Related Shortness Of Breath   Wheat Bran Shortness Of Breath   Methocarbamol Nausea And Vomiting   Tomato Other (See Comments)    SOB   Amoxicillin-Pot Clavulanate Rash   Ibuprofen Rash   Moxifloxacin Rash   Penicillin G Rash   Sulfamethoxazole-Trimethoprim Rash   Tetracycline Rash   Tramadol Rash    Immunization History  Administered Date(s) Administered   Influenza Split 12/28/2016, 01/11/2018   Influenza, High Dose Seasonal PF 01/03/2019   Influenza, Quadrivalent, Recombinant, Inj, Pf 12/31/2016   Influenza,inj,Quad PF,6-35 Mos 05/03/2018   PFIZER(Purple Top)SARS-COV-2 Vaccination 05/28/2019, 06/27/2019, 02/20/2020   Pneumococcal Conjugate-13 05/25/2018   Pneumococcal Polysaccharide-23 03/12/2009, 12/09/2010    Past Medical History:  Diagnosis Date   Anxiety    Aortic valve stenosis    Arthritis    Asthma    CAD (coronary artery disease)    single vessel   COPD (chronic obstructive pulmonary disease) (HCC)    GERD (gastroesophageal  reflux disease)    Headache    History of stress test 10/2011   Resting images reveal a normal pattern of perfusion in all regions. The post stress myocardial perfusion images show a normal pattern of perfusion in all region. The post stress left ventricle is normal size. the rest left ventricle is normal size, there is no scintigraphic evidence of inductible myocardial ischemia.   Hx of echocardiogram 09/2009   EF >55% Normal Echo   Hyperlipidemia    Hypertension    Migraines    Pneumonia    hx of   Pulmonary emboli (HCC) 5/12   Shortness of breath dyspnea    exertion or eating something shes allergic too   Tobacco abuse     Tobacco History: Social History   Tobacco Use  Smoking Status Some Days   Packs/day: 1.00   Types: Cigarettes   Start date: 01/31/1959   Last attempt to quit: 04/15/2019   Years since quitting: 1.7  Smokeless Tobacco Former   Quit date: 01/29/2014   Ready to quit: No Counseling given: Yes   Smoking assessment and cessation counseling  Patient currently smoking: 1 pack will last about 2 weeks I have advised the patient to quit/stop smoking as soon as possible due to high risk for multiple medical problems.  It will also be very difficult for [place] to manage patient's  respiratory symptoms and status if we continue to expose her lungs to a known irritant.  We do not advise e-cigarettes as a form of stopping smoking.  Patient is not willing to quit smoking.  She will consider stopping smoking.  I have advised the patient that we can assist and have options of nicotine replacement therapy, provided smoking cessation education today, provided smoking cessation counseling, and provided cessation resources.  Follow-up next office visit office visit for assessment of smoking cessation.    Smoking cessation counseling advised for: 6 min   Outpatient Encounter Medications as of 01/23/2021  Medication Sig   ALPRAZolam (XANAX) 1 MG tablet Take 1 mg by mouth 3  (three) times daily as needed for anxiety.    atorvastatin (LIPITOR) 80 MG tablet Take 1 tablet (80 mg total) by mouth daily.   butalbital-acetaminophen-caffeine (FIORICET) 50-325-40 MG tablet Take 1 tablet by mouth every 6 (six) hours as needed.   Cholecalciferol (VITAMIN D3) 2000 UNITS TABS Take 2,000 Units by mouth daily.    diltiazem (CARDIZEM CD) 300 MG 24  hr capsule Take 1 capsule (300 mg total) by mouth at bedtime.   escitalopram (LEXAPRO) 20 MG tablet Take 1 tablet (20 mg total) by mouth daily.   ezetimibe (ZETIA) 10 MG tablet Take 1 tablet (10 mg total) by mouth at bedtime.   fluticasone (FLONASE) 50 MCG/ACT nasal spray 2 sprays by Each Nare route daily.   Fluticasone-Salmeterol (ADVAIR) 250-50 MCG/DOSE AEPB Inhale 1 puff into the lungs 2 (two) times daily.   isosorbide mononitrate (IMDUR) 30 MG 24 hr tablet Take 1.5 tablets (45 mg total) by mouth daily.   metoprolol tartrate (LOPRESSOR) 25 MG tablet Take 1 tablet (25 mg total) by mouth 2 (two) times daily.   nitroGLYCERIN (NITROSTAT) 0.4 MG SL tablet Place 1 tablet (0.4 mg total) under the tongue every 5 (five) minutes as needed for chest pain. Needs appointment for future refills   olmesartan (BENICAR) 40 MG tablet Take 1 tablet (40 mg total) by mouth daily.   ondansetron (ZOFRAN) 4 MG tablet Take 4 mg by mouth every 8 (eight) hours as needed for nausea.   pantoprazole (PROTONIX) 40 MG tablet Take 40 mg by mouth 2 (two) times daily.    terbutaline (BRETHINE) 5 MG tablet Take 5 mg by mouth every 6 (six) hours as needed (asthma).    VENTOLIN HFA 108 (90 Base) MCG/ACT inhaler Inhale 2 puffs into the lungs every 6 (six) hours as needed for wheezing.    [DISCONTINUED] budesonide (PULMICORT) 0.5 MG/2ML nebulizer solution USE AS DIRECTED FOR NASAL RINSE   [DISCONTINUED] aspirin EC 81 MG tablet Take 81 mg by mouth daily. (Patient not taking: Reported on 10/21/2020)   No facility-administered encounter medications on file as of 01/23/2021.      Review of Systems  Review of Systems  Constitutional:  Negative for activity change, fatigue and fever.  HENT:  Negative for sinus pressure, sinus pain and sore throat.   Respiratory:  Positive for shortness of breath. Negative for cough and wheezing.   Cardiovascular:  Negative for chest pain and palpitations.  Gastrointestinal:  Negative for diarrhea, nausea and vomiting.  Musculoskeletal:  Negative for arthralgias.  Neurological:  Negative for dizziness.  Psychiatric/Behavioral:  Negative for sleep disturbance. The patient is not nervous/anxious.     Physical Exam  BP 122/74   Pulse (!) 57   Ht 5\' 4"  (1.626 m)   Wt 128 lb (58.1 kg)   LMP  (LMP Unknown)   SpO2 98% Comment: on RA  BMI 21.97 kg/m   Wt Readings from Last 5 Encounters:  01/23/21 128 lb (58.1 kg)  10/21/20 133 lb 12.8 oz (60.7 kg)  05/15/20 138 lb 8 oz (62.8 kg)  02/26/20 138 lb (62.6 kg)  11/08/19 133 lb (60.3 kg)    BMI Readings from Last 5 Encounters:  01/23/21 21.97 kg/m  10/21/20 22.97 kg/m  05/15/20 23.77 kg/m  02/26/20 23.69 kg/m  11/08/19 22.83 kg/m     Physical Exam Vitals and nursing note reviewed.  Constitutional:      General: She is not in acute distress.    Appearance: Normal appearance. She is normal weight. She is not ill-appearing, toxic-appearing or diaphoretic.  HENT:     Head: Normocephalic and atraumatic.     Right Ear: Tympanic membrane, ear canal and external ear normal. There is impacted cerumen.     Left Ear: Tympanic membrane, ear canal and external ear normal. There is no impacted cerumen.     Nose: Congestion and rhinorrhea present.     Mouth/Throat:  Mouth: Mucous membranes are moist.     Pharynx: Oropharynx is clear.  Eyes:     Pupils: Pupils are equal, round, and reactive to light.  Cardiovascular:     Rate and Rhythm: Normal rate and regular rhythm.     Pulses: Normal pulses.     Heart sounds: Normal heart sounds. No murmur heard. Pulmonary:      Effort: Pulmonary effort is normal. No tachypnea, bradypnea, accessory muscle usage or respiratory distress.     Breath sounds: Normal breath sounds. No stridor or decreased air movement. No decreased breath sounds, wheezing or rales.     Comments: Isolated squeak in upper right lung Musculoskeletal:     Cervical back: Normal range of motion.  Skin:    General: Skin is warm and dry.     Capillary Refill: Capillary refill takes less than 2 seconds.  Neurological:     General: No focal deficit present.     Mental Status: She is alert and oriented to person, place, and time. Mental status is at baseline.     Gait: Gait normal.  Psychiatric:        Mood and Affect: Mood normal.        Behavior: Behavior normal.        Thought Content: Thought content normal.        Judgment: Judgment normal.      Assessment & Plan:   Coronary artery disease Plan: Continue follow-up with cardiology, reschedule appointment with Dr. Tresa Endo  Hypertension Plan: Continue follow-up with cardiology  COPD (chronic obstructive pulmonary disease) (HCC) Plan: Continue terbutaline Continue Advair Continue Ventolin Schedule pulmonary function testing today We would highly recommend that you stop smoking Walk today in office  Anxiety and depression Plan: Continue Lexapro Schedule follow-up with primary care regarding management of chronic anxiety and depression Will defer additional refills for benzodiazepines to primary care  Current smoker 50-pack-year smoking history Recently bought a pack of cigarettes the other day, she reports this will last around 2 weeks  Plan: Discussed at length the need to stop smoking Pulmonary function testing ordered   Dyspnea Chronic dyspnea Likely multifactorial etiology Significant trigger of dyspnea is anxiety Patient with 50+ pack year smoking history Patient needs full pulmonary function testing Last chest x-ray was from 2017 Last breathing test was from  2018 with allergy asthma Patient reports adherence of terbutaline and Advair 250  Plan: Walk today in office-patient completed 1 lap, stable no oxygen desaturations on room air or tachycardia Chest x-ray today Lab work today to check CBC for hemoglobin >>> Will defer BNP as patient does not appear fluid overloaded on exam >>> Will defer additional testing for PE or DVT given clinical assessment, stable walk in office, patient with history of IVC filter placement Schedule pulmonary function testing before leaving the office Strongly recommend stopping smoking as discussed at office visit     Return in about 4 weeks (around 02/20/2021), or if symptoms worsen or fail to improve, for Follow up for FULL PFT - 60 min, Follow up with Dr. Celine Mans.   Coral Ceo, NP 01/23/2021   This appointment required 48 minutes of patient care (this includes precharting, chart review, review of results, face-to-face care, etc.).

## 2021-01-23 NOTE — Assessment & Plan Note (Signed)
Plan: Continue terbutaline Continue Advair Continue Ventolin Schedule pulmonary function testing today We would highly recommend that you stop smoking Walk today in office

## 2021-01-23 NOTE — Addendum Note (Signed)
Addended by: Demetrio Lapping E on: 01/23/2021 11:03 AM   Modules accepted: Orders

## 2021-01-23 NOTE — Assessment & Plan Note (Signed)
Plan: Continue follow-up with cardiology, reschedule appointment with Dr. Tresa Endo

## 2021-01-23 NOTE — Assessment & Plan Note (Signed)
Plan: Continue follow-up with cardiology 

## 2021-01-23 NOTE — Addendum Note (Signed)
Addended by: Maurene Capes on: 01/23/2021 11:28 AM   Modules accepted: Orders

## 2021-01-23 NOTE — Progress Notes (Signed)
Chest x-ray results showed no pneumonia or acute changes.  Lungs are mildly hyperinflated which is suggestive of COPD.  Keep upcoming appointment in December/2022 for pulmonary function testing to further evaluate.  Elisha Headland, FNP

## 2021-01-27 ENCOUNTER — Telehealth: Payer: Self-pay | Admitting: Internal Medicine

## 2021-01-27 ENCOUNTER — Encounter: Payer: Self-pay | Admitting: Pulmonary Disease

## 2021-01-28 NOTE — Telephone Encounter (Signed)
ATC patient line rings once and then goes to voicemail, [facility]

## 2021-01-29 NOTE — Telephone Encounter (Signed)
Patient returned phone call, confirmed DOB. Made aware of lab results. Voiced understanding.   Nothing further needed at this time.

## 2021-02-04 ENCOUNTER — Encounter: Payer: Self-pay | Admitting: *Deleted

## 2021-03-03 ENCOUNTER — Other Ambulatory Visit: Payer: Self-pay

## 2021-03-03 ENCOUNTER — Encounter: Payer: Self-pay | Admitting: Internal Medicine

## 2021-03-03 ENCOUNTER — Ambulatory Visit (INDEPENDENT_AMBULATORY_CARE_PROVIDER_SITE_OTHER): Payer: Medicare Other | Admitting: Internal Medicine

## 2021-03-03 VITALS — BP 120/70 | HR 57 | Temp 97.9°F | Ht 64.0 in

## 2021-03-03 DIAGNOSIS — J449 Chronic obstructive pulmonary disease, unspecified: Secondary | ICD-10-CM | POA: Diagnosis not present

## 2021-03-03 LAB — PULMONARY FUNCTION TEST
DL/VA % pred: 87 %
DL/VA: 3.59 ml/min/mmHg/L
DLCO cor % pred: 73 %
DLCO cor: 14 ml/min/mmHg
DLCO unc % pred: 73 %
DLCO unc: 14 ml/min/mmHg
FEF 25-75 Post: 1.04 L/sec
FEF 25-75 Pre: 0.72 L/sec
FEF2575-%Change-Post: 44 %
FEF2575-%Pred-Post: 68 %
FEF2575-%Pred-Pre: 47 %
FEV1-%Change-Post: 14 %
FEV1-%Pred-Post: 76 %
FEV1-%Pred-Pre: 66 %
FEV1-Post: 1.54 L
FEV1-Pre: 1.34 L
FEV1FVC-%Change-Post: 6 %
FEV1FVC-%Pred-Pre: 78 %
FEV6-%Change-Post: 7 %
FEV6-%Pred-Post: 94 %
FEV6-%Pred-Pre: 87 %
FEV6-Post: 2.42 L
FEV6-Pre: 2.24 L
FEV6FVC-%Change-Post: 0 %
FEV6FVC-%Pred-Post: 102 %
FEV6FVC-%Pred-Pre: 103 %
FVC-%Change-Post: 8 %
FVC-%Pred-Post: 92 %
FVC-%Pred-Pre: 85 %
FVC-Post: 2.48 L
FVC-Pre: 2.29 L
Post FEV1/FVC ratio: 62 %
Post FEV6/FVC ratio: 97 %
Pre FEV1/FVC ratio: 58 %
Pre FEV6/FVC Ratio: 98 %
RV % pred: 134 %
RV: 3.16 L
TLC % pred: 103 %
TLC: 5.23 L

## 2021-03-03 MED ORDER — TERBUTALINE SULFATE 5 MG PO TABS: 5.0000 mg | ORAL_TABLET | Freq: Four times a day (QID) | ORAL | 11 refills | Status: AC | PRN

## 2021-03-03 NOTE — Patient Instructions (Addendum)
Please schedule follow up scheduled with myself in 12 months.  If my schedule is not open yet, we will contact you with a reminder closer to that time. Please call [phone] if you haven't heard from [place] a month before.   Continue your advair and albuterol. I have sent terbutaline to your pharmacy.   You can try [facility] for your allergies.

## 2021-03-03 NOTE — Progress Notes (Signed)
Full PFTs completed today  ?

## 2021-03-03 NOTE — Progress Notes (Signed)
Judith Stevens    [id number]    Aug 07, 1940  Primary Care Physician:Kaplan, Isidor Holts., PA-C Date of Appointment: 03/03/2021 Established Patient Visit  Chief complaint:   Chief Complaint  Patient presents with   Follow-up    PFT review.       HPI: Judith Stevens is a 79 y.o. woman with dyspnea and ongoing tobacco use disorder. She is additionally dependent on benzodiazepines.   Interval Updates: She is here for follow up after PFTs. She is still smoking but a pack lasts about 2 weeks. She is still taking advair twice a day but takes it as needed. She takes albuterol which she takes 3-4 times/day.   She takes xanax for days when her breathing is bad. Takes oral terbutaline as needed and has for many years.   She is very long winded and difficult to redirect.   I have reviewed the patient's family social and past medical history and updated as appropriate.   Past Medical History:  Diagnosis Date   Anxiety    Aortic valve stenosis    Arthritis    Asthma    CAD (coronary artery disease)    single vessel   COPD (chronic obstructive pulmonary disease) (HCC)    GERD (gastroesophageal reflux disease)    Headache    History of stress test 10/2011   Resting images reveal a normal pattern of perfusion in all regions. The post stress myocardial perfusion images show a normal pattern of perfusion in all region. The post stress left ventricle is normal size. the rest left ventricle is normal size, there is no scintigraphic evidence of inductible myocardial ischemia.   Hx of echocardiogram 09/2009   EF >55% Normal Echo   Hyperlipidemia    Hypertension    Migraines    Pneumonia    hx of   Pulmonary emboli (HCC) 5/12   Shortness of breath dyspnea    exertion or eating something shes allergic too   Tobacco abuse     Past Surgical History:  Procedure Laterality Date   ADENOIDECTOMY     BACK SURGERY  2012   CARDIAC CATHETERIZATION  10/13/2007   60%  narrowing twin LAD system   DILATION AND CURETTAGE OF UTERUS     FOOT SURGERY  2002,2013   right   INTRAVASCULAR PRESSURE WIRE/FFR STUDY N/A 04/18/2019   Procedure: INTRAVASCULAR PRESSURE WIRE/FFR STUDY;  Surgeon: Marykay Lex, MD;  Location: [facility] INVASIVE CV LAB;  Service: Cardiovascular;  Laterality: N/A;   KNEE SURGERY  01/17/2007   right   LEFT HEART CATH AND CORONARY ANGIOGRAPHY N/A 04/18/2019   Procedure: LEFT HEART CATH AND CORONARY ANGIOGRAPHY;  Surgeon: Marykay Lex, MD;  Location: [facility] INVASIVE CV LAB;  Service: Cardiovascular;  Laterality: N/A;   SACROILIAC JOINT FUSION Right 03/28/2014   Procedure: SACROILIAC JOINT FUSION;  Surgeon: Emilee Hero, MD;  Location: [facility] OR;  Service: Orthopedics;  Laterality: Right;  Right sided sacroliac joint fusion   TONSILLECTOMY      Family History  Problem Relation Age of Onset   Heart attack Mother    Diabetes Mother    Asthma Mother    Heart attack Father    Diabetes Sister    Hyperlipidemia Sister    Allergic rhinitis Neg Hx    Angioedema Neg Hx    Eczema Neg Hx    Immunodeficiency Neg Hx    Urticaria Neg Hx     Social History  Occupational History   Not on file  Tobacco Use   Smoking status: Some Days    Packs/day: 1.00    Types: Cigarettes    Start date: 01/31/1959    Last attempt to quit: 04/15/2019    Years since quitting: 1.8   Smokeless tobacco: Former    Quit date: 01/29/2014  Vaping Use   Vaping Use: Never used  Substance and Sexual Activity   Alcohol use: Yes    Alcohol/week: 4.0 standard drinks    Types: 2 Glasses of wine, 2 Cans of beer per week   Drug use: No   Sexual activity: Not on file     Physical Exam: Blood pressure 120/70, pulse (!) 57, temperature 97.9 F (36.6 C), temperature source Oral, height 5\' 4"  (1.626 m), SpO2 98 %.  Gen:      No acute distress ENT:      mmm Lungs:    Diminished bilaterally, no wheezes or crackles CV:         bradycardic, regular no mrg   Data  Reviewed: Imaging: I have personally reviewed the chest xray October 2022 which which shows chronic interstitial changes.   PFTs:  PFT Results Latest Ref Rng & Units 03/03/2021  FVC-Pre L 2.29  FVC-Predicted Pre % 85  FVC-Post L 2.48  FVC-Predicted Post % 92  Pre FEV1/FVC % % 58  Post FEV1/FCV % % 62  FEV1-Pre L 1.34  FEV1-Predicted Pre % 66  FEV1-Post L 1.54  DLCO uncorrected ml/min/mmHg 14.00  DLCO UNC% % 73  DLCO corrected ml/min/mmHg 14.00  DLCO COR %Predicted % 73  DLVA Predicted % 87  TLC L 5.23  TLC % Predicted % 103  RV % Predicted % 134   I have personally reviewed the patient's PFTs and show moderate airflow limitation with  a significant bronchodilator response.   Labs: Lab Results  Component Value Date   WBC 7.8 01/23/2021   HGB 12.5 01/23/2021   HCT 37.9 01/23/2021   MCV 94.5 01/23/2021   PLT 147.0 (L) 01/23/2021   Lab Results  Component Value Date   NA 141 02/21/2020   K 4.7 02/21/2020   CL 103 02/21/2020   CO2 28 02/21/2020    Immunization status: Immunization History  Administered Date(s) Administered   Influenza Split 12/28/2016, 01/11/2018   Influenza, High Dose Seasonal PF 01/03/2019   Influenza, Quadrivalent, Recombinant, Inj, Pf 12/31/2016   Influenza,inj,Quad PF,6-35 Mos 05/03/2018   Influenza-Unspecified 01/29/2021   PFIZER Comirnaty(Gray Top)Covid-19 Tri-Sucrose Vaccine 10/16/2020   PFIZER(Purple Top)SARS-COV-2 Vaccination 05/28/2019, 06/27/2019, 02/20/2020   Pneumococcal Conjugate-13 05/25/2018   Pneumococcal Polysaccharide-23 03/12/2009, 12/09/2010    Assessment:  Moderate Asthma COPD Syndrome FEV1 66% of predicted Benzodiazepine dependence Every day tobacco use disorder  Plan/Recommendations: Reviewed PFTs with her.  Continue advair BID. She does not want to change this therapy.  Continue prn albuterol Continue terbultaline.   Return to Care: Return in about 1 year (around 03/03/2022).   14/07/2021, MD Pulmonary and  Critical Care Medicine [place] [facility] Office:[phone]

## 2021-03-11 ENCOUNTER — Telehealth: Payer: Self-pay | Admitting: Cardiovascular Disease

## 2021-03-11 MED ORDER — DILTIAZEM HCL ER COATED BEADS 300 MG PO CP24
300.0000 mg | ORAL_CAPSULE | Freq: Every day | ORAL | 1 refills | Status: DC
Start: 2021-03-11 — End: 2021-06-27

## 2021-03-11 NOTE — Telephone Encounter (Signed)
° ° °*  STAT* If patient is at the pharmacy, call can be transferred to refill team.   1. Which medications need to be refilled? (please list name of each medication and dose if known) diltiazem (CARDIZEM CD) 300 MG 24 hr capsule  2. Which pharmacy/location (including street and city if local pharmacy) is medication to be sent to? [id number] - [place], [place] - [address]  3. Do they need a 30 day or 90 day supply? 30 days  And also please send refill for 60 days supply to [facility] - [place], [place] - [address]

## 2021-03-11 NOTE — Telephone Encounter (Signed)
Refill sent to pharmacy.   

## 2021-03-19 ENCOUNTER — Telehealth: Payer: Self-pay | Admitting: Internal Medicine

## 2021-03-19 NOTE — Telephone Encounter (Signed)
Attempted to call pt but the call goes directly to VM. Received a message stating that the mailbox was full and could not accept any messages. Will try to call back later.

## 2021-03-20 NOTE — Telephone Encounter (Signed)
ATC pt. VM box is full. Will try once more before closing encounter.

## 2021-03-21 MED ORDER — ALBUTEROL SULFATE (2.5 MG/3ML) 0.083% IN NEBU: 2.5000 mg | INHALATION_SOLUTION | Freq: Four times a day (QID) | RESPIRATORY_TRACT | 5 refills | Status: DC | PRN

## 2021-03-21 NOTE — Telephone Encounter (Signed)
Called pt back and it went straight to her voicemail  [facility]

## 2021-03-21 NOTE — Telephone Encounter (Signed)
Called pt again and there was no answer and no option to leave her a msg. I called daughter, Larita Fife, ok per DPR and spoke with her. She says we are calling the correct number to reach the pt. She will call her and ask her to return our call.

## 2021-03-21 NOTE — Telephone Encounter (Signed)
I called the patient and had to leave a message for the patient and then I called the daughter to get more information and the daughter is very frustrated when I called as someone else has called previously.    The patient is wanting a nebulizer solution to be sent to the pharmacy. She is taking her Advair but is still feeling short of breath. She was last seen on 03/03/21. Please advise.

## 2021-03-21 NOTE — Telephone Encounter (Signed)
Please schedule her follow-up first available with Dr. Celine Mans. I will send in albuterol nebulizer, she should use this every 6 hours as needed for shortness of breath along with Advair twice daily

## 2021-03-21 NOTE — Telephone Encounter (Signed)
SHE NEED AN APPT SCHEDULED ALSO- LMTCB AGAIN. PLEASE MAKE HER APPT WITH [facility] NEXT AVAILABLE.

## 2021-03-21 NOTE — Telephone Encounter (Signed)
Pt states a rx was called in- nothing further needed

## 2021-03-25 NOTE — Telephone Encounter (Signed)
Patient has an appointment on 04/07/21 with Dr. Celine Mans.

## 2021-04-04 NOTE — Telephone Encounter (Signed)
FYI Patient sent in a mychart message regarding her 04/07/2021 appointment with Dr. Celine Mans.   "I won't be able to keep this appointment! I'm doing fine if the doctor would like to to be speak with me via cell phone that's fine, I've only used the machine once so far. I see no reason to come in there just because of using the machine once. Feel free to call me anytime I have my phone with me all the time. Thank you"

## 2021-04-07 ENCOUNTER — Ambulatory Visit: Payer: Medicare Other | Admitting: Internal Medicine

## 2021-05-06 ENCOUNTER — Telehealth: Payer: Self-pay | Admitting: Cardiovascular Disease

## 2021-05-06 MED ORDER — EZETIMIBE 10 MG PO TABS: 10.0000 mg | ORAL_TABLET | Freq: Every day | ORAL | 1 refills | Status: DC

## 2021-05-06 MED ORDER — ATORVASTATIN CALCIUM 80 MG PO TABS: 80.0000 mg | ORAL_TABLET | Freq: Every day | ORAL | 1 refills | Status: DC

## 2021-05-06 NOTE — Telephone Encounter (Signed)
[facility]

## 2021-05-06 NOTE — Telephone Encounter (Signed)
° ° °*  STAT* If patient is at the pharmacy, call can be transferred to refill team.   1. Which medications need to be refilled? (please list name of each medication and dose if known) atorvastatin (LIPITOR) 80 MG tablet ezetimibe (ZETIA) 10 MG tablet  2. Which pharmacy/location (including street and city if local pharmacy) is medication to be sent to? [facility] - [place], [place] - [address]  3. Do they need a 30 day or 90 day supply? 90 days   Pt is out of lipitor, she wanted to have 30 days supply [id number] [place], [place] - [address] and 60 days supply sent to [facility]

## 2021-06-23 ENCOUNTER — Telehealth: Payer: Self-pay | Admitting: Cardiovascular Disease

## 2021-06-23 ENCOUNTER — Ambulatory Visit: Payer: Medicare Other | Admitting: Cardiovascular Disease

## 2021-06-23 NOTE — Telephone Encounter (Signed)
Did not need this encounter °

## 2021-06-26 LAB — COMPREHENSIVE METABOLIC PANEL
ALT: 20 IU/L (ref 0–32)
AST: 23 IU/L (ref 0–40)
Albumin/Globulin Ratio: 3.6 — ABNORMAL HIGH (ref 1.2–2.2)
Albumin: 5 g/dL — ABNORMAL HIGH (ref 3.7–4.7)
Alkaline Phosphatase: 68 IU/L (ref 44–121)
BUN/Creatinine Ratio: 15 (ref 12–28)
BUN: 9 mg/dL (ref 8–27)
Bilirubin Total: 0.4 mg/dL (ref 0.0–1.2)
CO2: 23 mmol/L (ref 20–29)
Calcium: 9.3 mg/dL (ref 8.7–10.3)
Chloride: 103 mmol/L (ref 96–106)
Creatinine, Ser: 0.62 mg/dL (ref 0.57–1.00)
Globulin, Total: 1.4 g/dL — ABNORMAL LOW (ref 1.5–4.5)
Glucose: 108 mg/dL — ABNORMAL HIGH (ref 70–99)
Potassium: 4.6 mmol/L (ref 3.5–5.2)
Sodium: 141 mmol/L (ref 134–144)
Total Protein: 6.4 g/dL (ref 6.0–8.5)
eGFR: 91 mL/min/{1.73_m2} (ref >59)

## 2021-06-26 LAB — LIPID PANEL
Chol/HDL Ratio: 1.9 ratio (ref 0.0–4.4)
Cholesterol, Total: 171 mg/dL (ref 100–199)
HDL: 91 mg/dL (ref >39)
LDL Chol Calc (NIH): 68 mg/dL (ref 0–99)
Triglycerides: 60 mg/dL (ref 0–149)
VLDL Cholesterol Cal: 12 mg/dL (ref 5–40)

## 2021-06-26 LAB — CBC
Hematocrit: 39 % (ref 34.0–46.6)
Hemoglobin: 12.8 g/dL (ref 11.1–15.9)
MCH: 30.3 pg (ref 26.6–33.0)
MCHC: 32.8 g/dL (ref 31.5–35.7)
MCV: 92 fL (ref 79–97)
Platelets: 131 10*3/uL — ABNORMAL LOW (ref 150–450)
RBC: 4.22 x10E6/uL (ref 3.77–5.28)
RDW: 12.4 % (ref 11.7–15.4)
WBC: 3.3 10*3/uL — ABNORMAL LOW (ref 3.4–10.8)

## 2021-06-26 LAB — TSH: TSH: 1.38 u[IU]/mL (ref 0.450–4.500)

## 2021-06-26 NOTE — Progress Notes (Signed)
?Cardiology Clinic Note  ? ?Patient Name: Judith Stevens ?Date of Encounter: 06/27/2021 ? ?Primary Care Provider:  Aletha Stevens., PA-C ?Primary Cardiologist:  Judith Majestic, MD ? ?Patient Profile  ?  ?81 year old female with history of CAD, with cardiac cath in 2001 and 2009 revealing ostial narrowing of her LAD, HTN, tobacco abuse, COPD, hypercholesterolemia, migraines.   ? ?She underwent a coronary CT, which showed a calcium score of 81 which was 31 percentile for age and sex matched control.  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  There is right dominance.  There was diffuse calcified plaque in the RCA and proximal LAD with narrowing of 25-50%.  7.  She underwent FFR analysis with both lesions being in the normal FFR range of greater than 0.80.  ? ?She underwent cardiac catheterization on April 18, 2019 by Judith Stevens.  She was found to have moderate mid LAD stenosis of 55%.  In some views this appeared greater.  FFR analysis was negative at 0.85. 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  Medical therapy was recommended. Last office visit 09/2020.  ? ? ?Past Medical History  ?  ?Past Medical History:  ?Diagnosis Date  ? Anxiety   ? Aortic valve stenosis   ? Arthritis   ? Asthma   ? CAD (coronary artery disease)   ? single vessel  ? COPD (chronic obstructive pulmonary disease) ([place])   ? GERD (gastroesophageal reflux disease)   ? Headache   ? History of stress test 10/2011  ? Resting images reveal a normal pattern of perfusion in all regions. The post stress myocardial perfusion images show a normal pattern of perfusion in all region. The post stress left ventricle is normal size. the rest left ventricle is normal size, there is no scintigraphic evidence of inductible myocardial ischemia.  ? Hx of echocardiogram  09/2009  ? EF >55% Normal Echo  ? Hyperlipidemia   ? Hypertension   ? Migraines   ? Pneumonia   ? hx of  ? Pulmonary emboli ([place]) 5/12  ? Shortness of breath dyspnea   ? exertion or eating something shes allergic too  ? Tobacco abuse   ? ?Past Surgical History:  ?Procedure Laterality Date  ? ADENOIDECTOMY    ? BACK SURGERY  2012  ? CARDIAC CATHETERIZATION  10/13/2007  ? 60% narrowing twin LAD system  ? DILATION AND CURETTAGE OF UTERUS    ? FOOT SURGERY  2002,2013  ? right  ? INTRAVASCULAR PRESSURE WIRE/FFR STUDY N/A 04/18/2019  ? Procedure: INTRAVASCULAR PRESSURE WIRE/FFR STUDY;  Surgeon: Judith Man, MD;  Location: [facility] [facility];  Service: Cardiovascular;  Laterality: N/A;  ? KNEE SURGERY  01/17/2007  ? right  ? LEFT HEART CATH AND CORONARY ANGIOGRAPHY N/A 04/18/2019  ? Procedure: LEFT HEART CATH AND CORONARY ANGIOGRAPHY;  Surgeon: Judith Man, MD;  Location: [facility];  Service: Cardiovascular;  Laterality: N/A;  ? SACROILIAC JOINT FUSION Right 03/28/2014  ? Procedure: SACROILIAC JOINT FUSION;  Surgeon: Sinclair Ship, MD;  Location: [facility];  Service: Orthopedics;  Laterality: Right;  Right sided sacroliac joint fusion  ? TONSILLECTOMY    ? ? ?Allergies ? ?Allergies  ?Allergen Reactions  ? Cefzil [Cefprozil] Shortness Of Breath  ? Gluten Meal Shortness Of Breath  ? Keflex [Cephalexin]  Shortness Of Breath and Rash  ? Lactose Intolerance (Gi) Shortness Of Breath  ?  ALL DAIRY PRODUCTS  ? Levaquin [Levofloxacin] Shortness Of Breath and Rash  ? Naproxen Shortness Of Breath  ?  Shortness of breath ?Shortness of breath  ? Peanuts [Peanut Oil] Shortness Of Breath  ? Penicillins Shortness Of Breath  ? Percocet [Oxycodone-Acetaminophen] Shortness Of Breath  ? Prednisone Shortness Of Breath and Other (See Comments)  ?  Rash  ? Red Dye Shortness Of Breath  ? Sulfa Antibiotics Shortness Of Breath  ? Sulfacetamide Sodium Shortness Of Breath  ? Tetracycline Hcl Shortness Of Breath  ? Tetracyclines &  Related Shortness Of Breath  ? Wheat Bran Shortness Of Breath  ? Tomato Other (See Comments)  ?  SOB  ? Influenza Vaccines   ?  Other reaction(s): Other (See Comments) ?Arm redness and swelling.  ? Amoxicillin-Pot Clavulanate Rash  ? Ibuprofen Rash  ? Methocarbamol Nausea And Vomiting  ? Moxifloxacin Rash  ? Penicillin G Rash  ? Sulfamethoxazole-Trimethoprim Rash  ? Tetracycline Rash  ? Tramadol Rash  ? ? ?History of Present Illness  ?  ?Judith Stevens presents today for ongoing assessment and management of CAD, with medical therapy recommended, hypertension, hyperlipidemia. Last seen by Judith Stevens on 10/21/2020. ? ?She comes today complaining of worsening shortness of breath and has not received relief from current medication regimen.  She has seen a pulmonologist, had had PFTs and medications which were adjusted.  She was diagnosed with moderate emphysema/COPD, however she would like a second opinion concerning treatment.  She is finding that her breathing status has not improved with the medication adjustments.  She does have Xanax was helps her with the shortness of breath should this become overburdened some to her causing significant anxiety.  She states that does help her.  She continues to have concerns that the symptoms are related to cardiac etiology. ? ? ? ?Home Medications  ?  ?Current Outpatient Medications  ?Medication Sig Dispense Refill  ? albuterol (PROVENTIL) (2.5 MG/3ML) 0.083% nebulizer solution Take 3 mLs (2.5 mg total) by nebulization every 6 (six) hours as needed for wheezing or shortness of breath. 75 mL 5  ? ALPRAZolam (XANAX) 1 MG tablet Take 1 mg by mouth 3 (three) times daily as needed for anxiety.     ? butalbital-acetaminophen-caffeine (FIORICET) 50-325-40 MG tablet Take 1 tablet by mouth every 6 (six) hours as needed.    ? Cholecalciferol (VITAMIN D3) 2000 UNITS TABS Take 2,000 Units by mouth daily.     ? escitalopram (LEXAPRO) 20 MG tablet Take 1 tablet (20 mg total) by mouth daily. 90  tablet 0  ? ezetimibe (ZETIA) 10 MG tablet Take 1 tablet (10 mg total) by mouth daily. 14 tablet 0  ? fluticasone (FLONASE) 50 MCG/ACT nasal spray 2 sprays by Each Nare route daily.    ? Fluticasone-Salmeterol (ADVAIR) 250-50 MCG/DOSE AEPB Inhale 1 puff into the lungs 2 (two) times daily.    ? ondansetron (ZOFRAN) 4 MG tablet Take 4 mg by mouth every 8 (eight) hours as needed for nausea.    ? pantoprazole (PROTONIX) 40 MG tablet Take 40 mg by mouth 2 (two) times daily.     ? terbutaline (BRETHINE) 5 MG tablet Take 1 tablet (5 mg total) by mouth every 6 (six) hours as needed. 120 tablet 11  ? VENTOLIN HFA 108 (90 Base) MCG/ACT inhaler Inhale 2 puffs into the lungs every 6 (six) hours as needed for wheezing.  11  ? diltiazem (CARDIZEM CD) 300 MG 24 hr capsule Take 1 capsule (300 mg total) by mouth at bedtime. 90 capsule 2  ? ezetimibe (ZETIA) 10 MG tablet Take 1 tablet (10 mg total) by mouth at bedtime. 90 tablet 2  ? isosorbide mononitrate (IMDUR) 30 MG 24 hr tablet Take 1.5 tablets (45 mg total) by mouth daily. 135 tablet 3  ? metoprolol tartrate (LOPRESSOR) 25 MG tablet Take 1 tablet (25 mg total) by mouth 2 (two) times daily. 180 tablet 3  ? nitroGLYCERIN (NITROSTAT) 0.4 MG SL tablet Place 1 tablet (0.4 mg total) under the tongue every 5 (five) minutes as needed for chest pain. Needs appointment for future refills 25 tablet 0  ? olmesartan (BENICAR) 40 MG tablet Take 1 tablet (40 mg total) by mouth daily. 14 tablet 0  ? ?No current facility-administered medications for this visit.  ?  ? ?Family History  ?  ?Family History  ?Problem Relation Age of Onset  ? Heart attack Mother   ? Diabetes Mother   ? Asthma Mother   ? Heart attack Father   ? Diabetes Sister   ? Hyperlipidemia Sister   ? Allergic rhinitis Neg Hx   ? Angioedema Neg Hx   ? Eczema Neg Hx   ? Immunodeficiency Neg Hx   ? Urticaria Neg Hx   ? ?She indicated that her mother is deceased. She indicated that her father is deceased. She indicated that only  one of her three sisters is alive. She indicated that her maternal grandmother is deceased. She indicated that her maternal grandfather is deceased. She indicated that her paternal grandmother is deceased.

## 2021-06-27 ENCOUNTER — Encounter: Payer: Self-pay | Admitting: Adult Health

## 2021-06-27 ENCOUNTER — Ambulatory Visit (INDEPENDENT_AMBULATORY_CARE_PROVIDER_SITE_OTHER): Payer: Medicare Other | Admitting: Adult Health

## 2021-06-27 VITALS — BP 146/74 | HR 53 | Ht 64.0 in | Wt 130.2 lb

## 2021-06-27 DIAGNOSIS — E7800 Pure hypercholesterolemia, unspecified: Secondary | ICD-10-CM

## 2021-06-27 DIAGNOSIS — I10 Essential (primary) hypertension: Secondary | ICD-10-CM

## 2021-06-27 DIAGNOSIS — J449 Chronic obstructive pulmonary disease, unspecified: Secondary | ICD-10-CM | POA: Diagnosis not present

## 2021-06-27 DIAGNOSIS — F17200 Nicotine dependence, unspecified, uncomplicated: Secondary | ICD-10-CM

## 2021-06-27 DIAGNOSIS — E785 Hyperlipidemia, unspecified: Secondary | ICD-10-CM

## 2021-06-27 MED ORDER — METOPROLOL TARTRATE 25 MG PO TABS: 25.0000 mg | ORAL_TABLET | Freq: Two times a day (BID) | ORAL | 3 refills | Status: DC

## 2021-06-27 MED ORDER — OLMESARTAN MEDOXOMIL 40 MG PO TABS: 40.0000 mg | ORAL_TABLET | Freq: Every day | ORAL | 0 refills | Status: DC

## 2021-06-27 MED ORDER — EZETIMIBE 10 MG PO TABS: 10.0000 mg | ORAL_TABLET | Freq: Every day | ORAL | 0 refills | Status: DC

## 2021-06-27 MED ORDER — EZETIMIBE 10 MG PO TABS: 10.0000 mg | ORAL_TABLET | Freq: Every day | ORAL | 2 refills | Status: DC

## 2021-06-27 MED ORDER — DILTIAZEM HCL ER COATED BEADS 300 MG PO CP24: 300.0000 mg | ORAL_CAPSULE | Freq: Every day | ORAL | 2 refills | Status: DC

## 2021-06-27 MED ORDER — OLMESARTAN MEDOXOMIL 40 MG PO TABS: 40.0000 mg | ORAL_TABLET | Freq: Every day | ORAL | 3 refills | Status: DC

## 2021-06-27 MED ORDER — NITROGLYCERIN 0.4 MG SL SUBL
0.4000 mg | SUBLINGUAL_TABLET | SUBLINGUAL | 0 refills | Status: AC | PRN
Start: 2021-06-27 — End: ?

## 2021-06-27 MED ORDER — ISOSORBIDE MONONITRATE ER 30 MG PO TB24: 45.0000 mg | ORAL_TABLET | Freq: Every day | ORAL | 3 refills | Status: DC

## 2021-06-27 NOTE — Patient Instructions (Signed)
Medication Instructions:  ?No changes ?*If you need a refill on your cardiac medications before your next appointment, please call your pharmacy* ? ? ?Lab Work: ?None ?If you have labs (blood work) drawn today and your tests are completely normal, you will receive your results only by: ?MyChart Message (if you have MyChart) OR ?A paper copy in the mail ?If you have any lab test that is abnormal or we need to change your treatment, we will call you to review the results. ? ? ?Testing/Procedures: ?None ? ? ?Follow-Up: ?At [facility], you and your health needs are our priority.  As part of our continuing mission to provide you with exceptional heart care, we have created designated Provider Care Teams.  These Care Teams include your primary Cardiologist (physician) and Advanced Practice Providers (APPs -  Physician Assistants and Nurse Practitioners) who all work together to provide you with the care you need, when you need it. ? ?We recommend signing up for the patient portal called "MyChart".  Sign up information is provided on this After Visit Summary.  MyChart is used to connect with patients for Virtual Visits (Telemedicine).  Patients are able to view lab/test results, encounter notes, upcoming appointments, etc.  Non-urgent messages can be sent to your provider as well.   ?To learn more about what you can do with MyChart, go to [email or web address].   ? ?Your next appointment:   ?6 month(s) ? ?The format for your next appointment:   ?In Person ? ?Provider:   ?Nicki Guadalajara, MD   ? ? ?  ?

## 2021-07-15 ENCOUNTER — Telehealth: Payer: Self-pay | Admitting: Internal Medicine

## 2021-07-15 NOTE — Telephone Encounter (Signed)
Last ov note and PFT was faxed to the number provided and will close encounter. ?

## 2021-09-12 ENCOUNTER — Telehealth: Payer: Self-pay | Admitting: Cardiovascular Disease

## 2021-09-12 NOTE — Telephone Encounter (Signed)
Pt c/o Shortness Of Breath: STAT if SOB developed within the last 24 hours or pt is noticeably SOB on the phone  1. Are you currently SOB (can you hear that pt is SOB on the phone)? No  2. How long have you been experiencing SOB? Has been going on for a while  3. Are you SOB when sitting or when up moving around? Patient stated she gets short winded when she walks around  4. Are you currently experiencing any other symptoms? Patient states she has difficulty sleeping  Patient has appt on 09/16/21

## 2021-09-12 NOTE — Telephone Encounter (Signed)
-  Pt called stating SOB has worsened since last office visit on 4/18 and report symptoms develops with minimal exertion. -Pt denies swelling or significant weight increase and report she follows a strict diet  Appointment rescheduled for 6/20 for further evaluations

## 2021-09-15 ENCOUNTER — Other Ambulatory Visit: Payer: Self-pay | Admitting: Internal Medicine

## 2021-09-15 DIAGNOSIS — J449 Chronic obstructive pulmonary disease, unspecified: Secondary | ICD-10-CM

## 2021-09-15 NOTE — Progress Notes (Signed)
Cardiology Clinic Note   Patient Name: Judith Stevens Date of Encounter: 09/16/2021  Primary Care Provider:  Richmond Campbell., PA-C Primary Cardiologist:  Nicki Guadalajara, MD  Patient Profile    Judith Stevens 81 year old female presents today for follow-up of her coronary artery disease/unstable angina .  Past Medical History    Past Medical History:  Diagnosis Date   Anxiety    Aortic valve stenosis    Arthritis    Asthma    CAD (coronary artery disease)    single vessel   COPD (chronic obstructive pulmonary disease) (HCC)    GERD (gastroesophageal reflux disease)    Headache    History of stress test 10/2011   Resting images reveal a normal pattern of perfusion in all regions. The post stress myocardial perfusion images show a normal pattern of perfusion in all region. The post stress left ventricle is normal size. the rest left ventricle is normal size, there is no scintigraphic evidence of inductible myocardial ischemia.   Hx of echocardiogram 09/2009   EF >55% Normal Echo   Hyperlipidemia    Hypertension    Migraines    Pneumonia    hx of   Pulmonary emboli (HCC) 5/12   Shortness of breath dyspnea    exertion or eating something shes allergic too   Tobacco abuse    Past Surgical History:  Procedure Laterality Date   ADENOIDECTOMY     BACK SURGERY  2012   CARDIAC CATHETERIZATION  10/13/2007   60% narrowing twin LAD system   DILATION AND CURETTAGE OF UTERUS     FOOT SURGERY  2002,2013   right   INTRAVASCULAR PRESSURE WIRE/FFR STUDY N/A 04/18/2019   Procedure: INTRAVASCULAR PRESSURE WIRE/FFR STUDY;  Surgeon: Marykay Lex, MD;  Location: [facility] INVASIVE CV LAB;  Service: Cardiovascular;  Laterality: N/A;   KNEE SURGERY  01/17/2007   right   LEFT HEART CATH AND CORONARY ANGIOGRAPHY N/A 04/18/2019   Procedure: LEFT HEART CATH AND CORONARY ANGIOGRAPHY;  Surgeon: Marykay Lex, MD;  Location: [facility] INVASIVE CV LAB;  Service: Cardiovascular;   Laterality: N/A;   SACROILIAC JOINT FUSION Right 03/28/2014   Procedure: SACROILIAC JOINT FUSION;  Surgeon: Emilee Hero, MD;  Location: [facility] OR;  Service: Orthopedics;  Laterality: Right;  Right sided sacroliac joint fusion   TONSILLECTOMY      Allergies  Allergies  Allergen Reactions   Cefzil [Cefprozil] Shortness Of Breath   Gluten Meal Shortness Of Breath   Keflex [Cephalexin] Shortness Of Breath and Rash   Lactose Intolerance (Gi) Shortness Of Breath    ALL DAIRY PRODUCTS   Levaquin [Levofloxacin] Shortness Of Breath and Rash   Naproxen Shortness Of Breath    Shortness of breath Shortness of breath   Peanuts [Peanut Oil] Shortness Of Breath   Penicillins Shortness Of Breath   Percocet [Oxycodone-Acetaminophen] Shortness Of Breath   Prednisone Shortness Of Breath and Other (See Comments)    Rash   Red Dye Shortness Of Breath   Sulfa Antibiotics Shortness Of Breath   Sulfacetamide Sodium Shortness Of Breath   Tetracycline Hcl Shortness Of Breath   Tetracyclines & Related Shortness Of Breath   Wheat Bran Shortness Of Breath   Tomato Other (See Comments)    SOB   Influenza Vaccines     Other reaction(s): Other (See Comments) Arm redness and swelling.   Amoxicillin-Pot Clavulanate Rash   Ibuprofen Rash   Methocarbamol Nausea And Vomiting   Moxifloxacin Rash   Penicillin  G Rash   Sulfamethoxazole-Trimethoprim Rash   Tetracycline Rash   Tramadol Rash    History of Present Illness    Ms. Uher is a past medical history of coronary artery disease (60% stenosis in ostial LAD that dates back to 2001), hypertension, hyperlipidemia, and asthma.  She underwent cardiac catheterization in 2001 in [place] that showed a 60% ostial narrowing of her LAD.  She had a repeat cardiac catheterization by Dr. Tresa Endo in 2019 showed no significant change.  A nuclear stress test August 2013 showed no new changes from 2 years prior and showed normal perfusion.   She underwent right  hip surgery in 2015.  She had a 2-year follow-up nuclear stress test 03/13/2014 preoperatively.  This showed normal perfusion without scar or ischemia.  She tolerated her hip surgery well.  An echocardiogram 02/18/2015 showed an EF of 55-60%, moderate thickening and calcification of a trileaflet aortic valve, which was consistent with sclerosis and no stenosis.  Mitral annular calcification was also present.   She was seen by Dr. Tresa Endo 01/2017 after she had been taking care of her sister who had undergone aortic stent grafting.  She was seen by pulmonary and underwent a coronary CT which showed a calcium score of 81 which was 93% for her age and gender.  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  There was diffuse calcified plaque in the RCA and proximal LAD narrowing of 25-50%.  She underwent FFR analysis with both lesions which were in the normal FFR range of greater than 0.8.   She was seen by Dr. Tresa Endo in the office 04/12/2019.  During that time she stated over the last several months she had felt fairly well but in the past 6-8 weeks she has been more short of breath.  3 weeks prior she has had an episode of chest tightness which resolved.  She denied any significant chest discomfort since that time and also denied nocturnal symptoms.  She had experienced shortness of breath with activity and was taking terbutaline without improvement of her symptoms.  She denied PND and orthopnea at that time.  Her EKG showed sinus rhythm 70 bpm with isolated PVCs and new inferior anterior T wave inversion 3-5 mm in leads II,III, aVF, and V4-V6.  A cardiac catheterization was recommended.  She underwent cardiac catheterization 04/18/2019 which showed a moderate LAD second diagonal bifurcation lesion.  Continuation of medical management was recommended.   She presented to the clinic 04/28/2019 and stated she was feeling somewhat better since her  cardiac catheterization.  She continued to have some shortness of breath but stated that when she took her inhaler her terbutaline and Xanax she felt much better.  She was ready to increase her physical activity and stated that she had an exercise bike that she would like to use.  Given that she was feeling somewhat better  I did not change any medications at the time.  She was concerned about aspirin and risk for bleeding. I  instructed her to inspect her prescription and make sure that it was enteric-coated.  Finally she stated that she did not appreciate comments made in the Cath Lab about her smoking and the contribution to her vascular disease by Dr. Herbie Baltimore.  I  gave her the salty 6 sheet, a blood pressure log, and  instructed her to increase her physical activity slowly.  Follow-up with Dr. Tresa Endo in 3 months.  She  was seen by Dr. Tresa Endo 07/14/2019.  She was doing well at that time.  She denied chest pain, orthopnea, and PND.  She denied awareness of palpitations.  She was sleeping well.  Her EKG showed resolution of T wave abnormalities.  Her olmesartan was increased to 40 mg daily due to blood pressures in the mid 130 range.  She presented to the clinic 11/08/2019 for follow-up evaluation and stated she felt well.  She indicated that she was having more shortness of breath than normal over the last month several weeks.  She stated that she had also had one episode of chest pain that started in her left chest and moved to her right chest.  The pain lasted 5 minutes,  was not associated with diaphoresis.  She did not take any nitroglycerin.  She had been compliant with her medications.  She was reassured that this was not felt to be cardiac in nature.  Given her smoking history, I  refered her to pulmonology for PFTs.  She also requested a handicap placard due to her decreased ability to walk related to her breathing problems.  I asked her to increase her physical activity as tolerated, maintain low-salt  diet, and planned follow-up with Dr. Tresa Endo as scheduled.  She was seen in follow-up by Dr. Tresa Endo on 10/21/2020.  Her blood pressure was well controlled.  She denied anginal symptoms.  She was seen by Bailey Mech, DNP on 06/27/2021.  She reported worsening shortness of breath that was not relieved by medication.  Her PFTs showed moderate emphysema/COPD and her medications were adjusted by pulmonology.  Her breathing had not improved.  She reported that her Xanax helped with her shortness of breath.  She was reassured that her breathing status was not related to her heart.  She asked for a referral to different pulmonology for second opinion.  No cardiac medications or adjusted.  She presents to the clinic today for follow-up evaluation states she had a follow-up pulmonology appointment with a different pulmonologist.  She did not agree with what they said.  She feels that her next step should be seen immunotherapy/allergy providers.  She is also seeing GI for an evaluation/upper endoscopy to see if she has any GI issues that may be causing her increased shortness of breath.  We reviewed her cardiac catheterization and echocardiogram she expressed understanding.  We reviewed her cholesterol.  She expressed understanding.  She reports that she has intermittent episodes of continued shortness of breath.  I have asked her to increase her physical activity because she leads a fairly sedentary lifestyle.  We will continue her current medication regimen and plan follow-up in 6 months.   She denies chest pain, increased shortness of breath, lower extremity edema, fatigue, melena, hematuria, hemoptysis, diaphoresis, weakness, presyncope, syncope, orthopnea, and PND.  Home Medications    Prior to Admission medications   Medication Sig Start Date End Date Taking? Authorizing Provider  ALPRAZolam Prudy Feeler) 1 MG tablet Take 1 mg by mouth 3 (three) times daily as needed for anxiety.  04/11/18   [provider]  aspirin EC 81 MG tablet Take 81 mg by mouth daily.    [provider]  atorvastatin (LIPITOR) 80 MG tablet Take 1 tablet (80 mg total) by mouth daily. 05/05/19   Lennette Bihari, MD  budesonide (PULMICORT) 0.5 MG/2ML nebulizer solution USE AS DIRECTED FOR NASAL RINSE 02/04/17   Bobbitt, Heywood Iles, MD  butalbital-acetaminophen-caffeine (FIORICET) [phone] MG tablet Take 1 tablet by mouth  every 6 (six) hours as needed. 03/22/19   [provider]  Cholecalciferol (VITAMIN D3) 2000 UNITS TABS Take 2,000 Units by mouth daily.     [provider]  diltiazem (CARDIZEM CD) 300 MG 24 hr capsule TAKE ONE CAPSULE BY MOUTH EVERY DAY AT BEDTIME 07/20/19   Lennette Bihari, MD  escitalopram (LEXAPRO) 20 MG tablet Take 1 tablet (20 mg total) by mouth daily. 04/13/13   Lennette Bihari, MD  ezetimibe (ZETIA) 10 MG tablet Take 1 tablet (10 mg total) by mouth at bedtime. 07/26/19   Lennette Bihari, MD  Fluticasone-Salmeterol (ADVAIR DISKUS) 250-50 MCG/DOSE AEPB Inhale 1 puff into the lungs 2 (two) times daily. 03/11/16   [provider]  isosorbide mononitrate (IMDUR) 30 MG 24 hr tablet TAKE 1 TABLET BY MOUTH EVERY DAY 08/22/19   Lennette Bihari, MD  metoprolol tartrate (LOPRESSOR) 25 MG tablet Take 1 tablet (25 mg total) by mouth 2 (two) times daily. 07/26/19 01/22/20  Lennette Bihari, MD  Multiple Vitamin (MULTI-VITAMIN DAILY PO) Take 1 tablet by mouth daily.    [provider]  nitroGLYCERIN (NITROSTAT) 0.4 MG SL tablet Place 1 tablet (0.4 mg total) under the tongue every 5 (five) minutes as needed for chest pain. Needs appointment for future refills 04/26/18   Lennette Bihari, MD  olmesartan (BENICAR) 40 MG tablet Take 1 tablet (40 mg total) by mouth daily. 07/26/19   Lennette Bihari, MD  ondansetron (ZOFRAN) 4 MG tablet Take 4 mg by mouth every 8 (eight) hours as needed for nausea.    [provider]  pantoprazole (PROTONIX) 40 MG tablet Take 40 mg by mouth 2  (two) times daily.  09/20/17   [provider]  terbutaline (BRETHINE) 5 MG tablet Take 5 mg by mouth every 6 (six) hours as needed (asthma).     [provider]  VENTOLIN HFA 108 (90 Base) MCG/ACT inhaler Inhale 2 puffs into the lungs every 6 (six) hours as needed for wheezing.  12/20/15   [provider]    Family History    Family History  Problem Relation Age of Onset   Heart attack Mother    Diabetes Mother    Asthma Mother    Heart attack Father    Diabetes Sister    Hyperlipidemia Sister    Allergic rhinitis Neg Hx    Angioedema Neg Hx    Eczema Neg Hx    Immunodeficiency Neg Hx    Urticaria Neg Hx    She indicated that her mother is deceased. She indicated that her father is deceased. She indicated that only one of her three sisters is alive. She indicated that her maternal grandmother is deceased. She indicated that her maternal grandfather is deceased. She indicated that her paternal grandmother is deceased. She indicated that her paternal grandfather is deceased. She indicated that the status of her neg hx is unknown.  Social History    Social History   Socioeconomic History   Marital status: Widowed    Spouse name: Not on file   Number of children: Not on file   Years of education: Not on file   Highest education level: Not on file  Occupational History   Not on file  Tobacco Use   Smoking status: Some Days    Packs/day: 1.00    Types: Cigarettes    Start date: 01/31/1959    Last attempt to quit: 04/15/2019    Years since quitting: 2.4  Smokeless tobacco: Former    Quit date: 01/29/2014  Vaping Use   Vaping Use: Never used  Substance and Sexual Activity   Alcohol use: Yes    Alcohol/week: 4.0 standard drinks of alcohol    Types: 2 Glasses of wine, 2 Cans of beer per week   Drug use: No   Sexual activity: Not on file  Other Topics Concern   Not on file  Social History Narrative   Not on file   Social Determinants of Health    Financial Resource Strain: Not on file  Food Insecurity: Not on file  Transportation Needs: Not on file  Physical Activity: Not on file  Stress: Not on file  Social Connections: Not on file  Intimate Partner Violence: Not on file     Review of Systems    General:  No chills, fever, night sweats or weight changes.  Cardiovascular:  No chest pain, dyspnea on exertion, edema, orthopnea, palpitations, paroxysmal nocturnal dyspnea. Dermatological: No rash, lesions/masses Respiratory: No cough, dyspnea Urologic: No hematuria, dysuria Abdominal:   No nausea, vomiting, diarrhea, bright red blood per rectum, melena, or hematemesis Neurologic:  No visual changes, wkns, changes in mental status. All other systems reviewed and are otherwise negative except as noted above.  Physical Exam    VS:  BP (!) 112/58   Pulse (!) 54   Ht 5\' 4"  (1.626 m)   Wt 132 lb (59.9 kg)   LMP  (LMP Unknown)   SpO2 96%   BMI 22.66 kg/m  , BMI Body mass index is 22.66 kg/m. GEN: Well nourished, well developed, in no acute distress. HEENT: normal. Neck: Supple, no JVD, carotid bruits, or masses. Cardiac: RRR, no murmurs, rubs, or gallops. No clubbing, cyanosis, edema.  Radials/DP/PT 2+ and equal bilaterally.  Respiratory:  Respirations regular and unlabored, clear to auscultation bilaterally.  +Dyspnea on exertion/shortness of breath GI: Soft, nontender, nondistended, BS + x 4. MS: no deformity or atrophy. Skin: warm and dry, no rash. Neuro:  Strength and sensation are intact. Psych: Normal affect.  Accessory Clinical Findings    Recent Labs: 06/25/2021: ALT 20; BUN 9; Creatinine, Ser 0.62; Hemoglobin 12.8; Platelets 131; Potassium 4.6; Sodium 141; TSH 1.380   Recent Lipid Panel    Component Value Date/Time   CHOL 171 06/25/2021 0851   TRIG 60 06/25/2021 0851   HDL 91 06/25/2021 0851   CHOLHDL 1.9 06/25/2021 0851   CHOLHDL 2.0 04/27/2016 0923   VLDL 10 04/27/2016 0923   LDLCALC 68 06/25/2021  0851    ECG personally reviewed by me today-none today.    EKG 07/14/2019 Sinus bradycardia 51 bpm no ST or T wave deviation   EKG 04/17/2019  Sinus bradycardia 51 bpm LVH by voltage criteria.  More deeply inverted inferiolateral T wave inversion 8-10 mm in the anteriolateral leads and 2-4 mm inferiorly   EKG 04/14/2019 Normal sinus rhythm 70 bpm New inferior and anterolateral T wave inversion   EKG 12/18/2018 Sinus bradycardia 58 bpm nonspecific ST changes   TTE: 04/18/19   IMPRESSIONS      1. Left ventricular ejection fraction, by visual estimation, is 65 to 70%. The left ventricle has hyperdynamic function. There is no left ventricular hypertrophy.  2. Left ventricular diastolic parameters are indeterminate.  3. Mildly dilated left ventricular internal cavity size.  4. The left ventricle has no regional wall motion abnormalities.  5. Global right ventricle has normal systolic function.The right ventricular size is normal. No increase in right ventricular wall  thickness.  6. Left atrial size was mildly dilated.  7. Right atrial size was normal.  8. Moderate mitral annular calcification.  9. The mitral valve is normal in structure. Trivial mitral valve regurgitation. 10. The tricuspid valve is normal in structure. 11. The tricuspid valve is normal in structure. Tricuspid valve regurgitation is trivial. 12. The aortic valve has an indeterminant number of cusps. Aortic valve regurgitation is not visualized. 13. The pulmonic valve was grossly normal. Pulmonic valve regurgitation is not visualized. 14. The ascending aorta was not well visualized. 15. The inferior vena cava is normal in size with greater than 50% respiratory variability, suggesting right atrial pressure of 3 mmHg   Cardiac catheterization 04/17/2018 There is hyperdynamic left ventricular systolic function. The left ventricular ejection fraction is greater than 65% by visual estimate. LV end diastolic pressure is  normal. 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 Borderline LVEDP despite systemic hypertension.     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 Considered Ranexa, however has a flag for red dye allergy (would need to discuss with pharmacy)     Bryan Lemma, M.D., M.S. Interventional Cardiologist     Diagnostic Dominance: Right     Echocardiogram 11/07/2020  Left ventricular ejection fraction, by estimation, is 60 to 65%. The  left ventricle has normal function. The left ventricle has no regional  wall motion abnormalities. The left ventricular internal cavity size was  mildly dilated. Left ventricular  diastolic parameters were normal. The average left ventricular global  longitudinal strain is -17.0 %. The global longitudinal strain is normal.   2. Right ventricular systolic function is normal. The right ventricular  size is normal.   3. The mitral valve is normal in structure. Trivial mitral valve  regurgitation. No evidence of mitral stenosis.   4. The aortic valve is tricuspid. Aortic valve regurgitation is trivial.  No aortic stenosis is present.   5. The  inferior vena cava is normal in size with greater than 50%  respiratory variability, suggesting right atrial pressure of 3 mmHg.   Assessment & Plan   1.  Coronary artery disease-no chest pain today or recent episodes of chest discomfort..  Status post [facility] 04/18/2019.  Medical management recommended.   Continue   diltiazem, Zetia, Imdur, metoprolol, sublingual nitroglycerin, olmesartan Heart healthy low-sodium diet-salty 6 reviewed Increase physical activity as tolerated   Essential hypertension-BP today 112/58.  Well-controlled at home.  Continues to increase with physical activity Continue diltiazem, Imdur, metoprolol, olmesartan Heart healthy low-sodium diet-salty 6 given Increase physical activity as tolerated  Hyperlipidemia-06/25/2021: Cholesterol, Total 171; HDL 91; LDL Chol Calc (NIH) 68; Triglycerides 60 Continue  Zetia Heart healthy low-sodium high-fiber diet  Dyspnea on exertion-chronic, stable.  Follows with pulmonology.   Notes some relief with her  albuterol, Advair, and terbutaline.  PFTs showed moderate emphysema/COPD.  Plans to see allergist. Continue current medication regimen Increase physical activity as tolerated   Disposition: Follow-up with Dr. Tresa Endo or me in 6 months.   Thomasene Ripple. Terrell Shimko NP-C    09/16/2021, 3:15 PM [facility] [address] Office [phone] Fax [phone]  Notice: This dictation was prepared with Dragon dictation along with smaller phrase technology. Any transcriptional errors that result from this process are unintentional and may not be corrected upon review.  I spent 15 minutes examining this patient, reviewing medications, and using patient centered shared decision making involving her cardiac care.  Prior to her visit I spent greater than 20 minutes reviewing her past medical history,  medications, and prior cardiac tests.

## 2021-09-16 ENCOUNTER — Encounter: Payer: Self-pay | Admitting: General Practice

## 2021-09-16 ENCOUNTER — Ambulatory Visit (INDEPENDENT_AMBULATORY_CARE_PROVIDER_SITE_OTHER): Payer: Medicare Other | Admitting: General Practice

## 2021-09-16 VITALS — BP 112/58 | HR 54 | Ht 64.0 in | Wt 132.0 lb

## 2021-09-16 DIAGNOSIS — E785 Hyperlipidemia, unspecified: Secondary | ICD-10-CM | POA: Diagnosis not present

## 2021-09-16 DIAGNOSIS — R0609 Other forms of dyspnea: Secondary | ICD-10-CM | POA: Diagnosis not present

## 2021-09-16 DIAGNOSIS — I10 Essential (primary) hypertension: Secondary | ICD-10-CM

## 2021-09-16 DIAGNOSIS — I25118 Atherosclerotic heart disease of native coronary artery with other forms of angina pectoris: Secondary | ICD-10-CM

## 2021-09-16 NOTE — Patient Instructions (Signed)
Medication Instructions:  The current medical regimen is effective;  continue present plan and medications as directed. Please refer to the Current Medication list given to you today.   *If you need a refill on your cardiac medications before your next appointment, please call your pharmacy*  Lab Work:   Testing/Procedures:  NONE    NONE  If you have labs (blood work) drawn today and your tests are completely normal, you will receive your results only by: MyChart Message (if you have MyChart) OR  A paper copy in the mail If you have any lab test that is abnormal or we need to change your treatment, we will call you to review the results.  Special Instructions PLEASE FOLLOW UP WITH YOUR ALLERGIST  PLEASE INCREASE PHYSICAL ACTIVITY AS TOLERATED   Follow-Up: Your next appointment:  6 month(s) In Person with Nicki Guadalajara, MD  :1  At [facility], you and your health needs are our priority.  As part of our continuing mission to provide you with exceptional heart care, we have created designated Provider Care Teams.  These Care Teams include your primary Cardiologist (physician) and Advanced Practice Providers (APPs -  Physician Assistants and Nurse Practitioners) who all work together to provide you with the care you need, when you need it.  Important Information About Sugar

## 2021-10-01 ENCOUNTER — Telehealth: Payer: Self-pay | Admitting: Cardiovascular Disease

## 2021-10-01 NOTE — Telephone Encounter (Signed)
*  STAT* If patient is at the pharmacy, call can be transferred to refill team.   1. Which medications need to be refilled? (please list name of each medication and dose if known) diltiazem (CARDIZEM CD) 300 MG 24 hr capsule  2. Which pharmacy/location (including street and city if local pharmacy) is medication to be sent to? [facility] - [place], [place] - [address]  3. Do they need a 30 day or 90 day supply? 90 day

## 2021-10-02 MED ORDER — DILTIAZEM HCL ER COATED BEADS 300 MG PO CP24
300.0000 mg | ORAL_CAPSULE | Freq: Every day | ORAL | 2 refills | Status: DC
Start: 2021-10-02 — End: 2022-11-20

## 2021-10-09 ENCOUNTER — Other Ambulatory Visit (HOSPITAL_COMMUNITY): Payer: Self-pay

## 2021-10-10 ENCOUNTER — Other Ambulatory Visit (HOSPITAL_COMMUNITY): Payer: Self-pay

## 2021-10-10 NOTE — Telephone Encounter (Signed)
Attempted to call pt but unable to reach. Left message for her to return call. 

## 2021-10-16 NOTE — Telephone Encounter (Signed)
Pt called back about her medication. I told her what our prior auth team said and she stated she has never had any problems with her pharmacy when trying to get the medication before. Stated to her to contact her pharmacy and see what they said and she verbalized understanding. Nothing further needed.

## 2021-11-02 ENCOUNTER — Other Ambulatory Visit: Payer: Self-pay | Admitting: Cardiovascular Disease

## 2021-11-25 ENCOUNTER — Telehealth: Payer: Self-pay | Admitting: Cardiovascular Disease

## 2021-11-25 NOTE — Telephone Encounter (Unsigned)
*  STAT* If patient is at the pharmacy, call can be transferred to refill team.   1. Which medications need to be refilled? (please list name of each medication and dose if known) isosorbide,, and Ezetimibe  2. Which pharmacy/location (including street and city if local pharmacy) is medication to be sent to? [facility] [place]  3. Do they need a 30 day or 90 day supply? 90 days and refillss

## 2021-11-25 NOTE — Telephone Encounter (Signed)
*  STAT* If patient is at the pharmacy, call can be transferred to refill team.   1. Which medications need to be refilled? (please list name of each medication and dose if known) new prescription to her local pharmacy- need it until her mail order comes Atorvastatin,  2. Which pharmacy/location (including street and city if local pharmacy) is medication to be sent to? [facility]  3. Do they need a 30 day or 90 day supply? 90 days - need this called in today- taking the last one today please

## 2021-11-26 MED ORDER — EZETIMIBE 10 MG PO TABS: ORAL_TABLET | ORAL | 0 refills | Status: DC

## 2021-11-26 MED ORDER — ATORVASTATIN CALCIUM 80 MG PO TABS: 80.0000 mg | ORAL_TABLET | Freq: Every day | ORAL | 1 refills | Status: DC

## 2021-11-26 MED ORDER — ISOSORBIDE MONONITRATE ER 30 MG PO TB24: 45.0000 mg | ORAL_TABLET | Freq: Every day | ORAL | 3 refills | Status: AC

## 2021-12-12 ENCOUNTER — Ambulatory Visit: Payer: Medicare Other | Admitting: Adult Health

## 2021-12-18 ENCOUNTER — Other Ambulatory Visit: Payer: Self-pay | Admitting: Cardiovascular Disease

## 2022-01-24 ENCOUNTER — Other Ambulatory Visit: Payer: Self-pay | Admitting: Cardiovascular Disease

## 2022-02-21 ENCOUNTER — Other Ambulatory Visit: Payer: Self-pay | Admitting: Cardiovascular Disease

## 2022-03-16 ENCOUNTER — Encounter: Payer: Self-pay | Admitting: Cardiovascular Disease

## 2022-03-16 ENCOUNTER — Ambulatory Visit: Payer: Medicare Other | Attending: Cardiovascular Disease | Admitting: Cardiovascular Disease

## 2022-03-16 VITALS — BP 122/68 | HR 55 | Ht 64.0 in | Wt 135.2 lb

## 2022-03-16 DIAGNOSIS — K219 Gastro-esophageal reflux disease without esophagitis: Secondary | ICD-10-CM

## 2022-03-16 DIAGNOSIS — Z720 Tobacco use: Secondary | ICD-10-CM

## 2022-03-16 DIAGNOSIS — I10 Essential (primary) hypertension: Secondary | ICD-10-CM

## 2022-03-16 DIAGNOSIS — E785 Hyperlipidemia, unspecified: Secondary | ICD-10-CM

## 2022-03-16 DIAGNOSIS — F32A Depression, unspecified: Secondary | ICD-10-CM | POA: Diagnosis present

## 2022-03-16 DIAGNOSIS — R0602 Shortness of breath: Secondary | ICD-10-CM

## 2022-03-16 DIAGNOSIS — J449 Chronic obstructive pulmonary disease, unspecified: Secondary | ICD-10-CM

## 2022-03-16 DIAGNOSIS — F419 Anxiety disorder, unspecified: Secondary | ICD-10-CM

## 2022-03-16 DIAGNOSIS — I25118 Atherosclerotic heart disease of native coronary artery with other forms of angina pectoris: Secondary | ICD-10-CM | POA: Diagnosis not present

## 2022-03-16 MED ORDER — BISOPROLOL FUMARATE 5 MG PO TABS: 2.5000 mg | ORAL_TABLET | Freq: Every day | ORAL | 3 refills | Status: DC

## 2022-03-16 NOTE — Progress Notes (Signed)
Patient ID: LATITIA HOUSEWRIGHT, female   DOB: Aug 16, 1940, 81 y.o.   MRN: [id number]      HPI: SYLIVA MEE is a 81 y.o. female who presents for a 17 month follow-up cardiology evaluation.   Ms. Viramontes has CAD and in 2001 underwent cardiac catheterization while in [place] which showed a 60% ostial narrowing of her LAD. Repeat catheterization by me in 2009 did not show any significant interval change. Unfortunately, she has continued her ongoing tobacco habit but is now smoking significantly reduced at less than one quarter pack per day.  She has a history of hypertension as well as hyperlipidemia.  A nuclear perfusion study in August 2013 which was unchanged from 2 years previously and continued to show normal perfusion.   In December 2015 prior to undergoing right hip surgery a 2 year follow-up nuclear perfusion study on 03/13/2014 was performed preoperatively.  This continued to show normal perfusion without scar or ischemia.  She tolerated her hip surgery well.  She had smoked for 50 years before finally quitting smoking when I had seen her last.  When I last saw he 2 months ago, she had complained of experiencing increasing shortness of breath of 6 weeks duration that was not consistently exertionally precipitated.   She admitted to fatigue.  She is unaware of palpitations.  She has hyperlipidemia and has been taking atorvastatin 80 mg and Zetia 10 mg in attempt to continue to induce plaque regression.    An echo Doppler study on 02/18/2015 showed an EF of 55-60%, moderate thickening and calcification of a trileaflet aortic valve, consistent with sclerosis but without stenosis and mitral annular calcification.  Laboratory revealed excellent lipid studies with a total cholesterol 149, triglycerides 55, HDL 85 and her LDL was now 53.  Thyroid function studies were normal.  Her renal function was excellent.  LFTs were normal.  Vitamin D was normal at 41 and her vitamin B-12 level was normal at  448.    She was seen by [name] in October 2017 with complaints of dyspnea on exertion.  She has a history of asthma as well as numerous allergies to foods and some drugs.  She tells me she had the flu in early January 2018.  When I last saw her in January 2018.  She had stop smoking.  She did have complaints of dyspnea.  I recommended a follow-up echo Doppler study which was done on 04/29/2016.  This showed normal LV function and normal diastolic parameters.  The aortic valve was mildly calcified and was without stenosis.  There was mild aortic insufficiency.  Her mitral valve annulus was calcified and leaflets were mildly thickened.  Her left atrium was mildly dilated.   When I saw her in November 2018 she had been taking caring for her sister who had undergone aortic stent grafting.  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  When I saw her, I recommended complete smoking cessation and suggested a pulmonary evaluation with Dr. Curt Jews.   She underwent a coronary CT, which showed a calcium score of 81 which was 26 percentile for age and sex matched control.  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  There is right dominance.  There was diffuse calcified plaque in the RCA and proximal LAD with narrowing of 25-50%.  7.  She underwent FFR analysis with both lesions being in the normal FFR  range of greater than 0.80.  She has significantly reduced her tobacco and now smokes very intermittently.   I saw her in May 04, 2017 currently at that time it appeared that she was on both low-dose amlodipine in addition to diltiazem.  I discontinued amlodipine and further titrated diltiazem.  Over the past year she has done fairly well.  She had seen Mammie Russian, PA C in August 2019.  She had experienced occasional twinges of chest pain.  He had undergone cataract surgery.  I saw her in January 2020  in the office and  in April 2020 in a telemedicine visit.  At that time, she was tolerating olmesartan 20 mg with her diltiazem for blood pressure control.  There was concerns of GI issues and she is in need for a colonoscopy.  She had been evaluated by Dr. Benson Norway and this had been deferred with the COVID-19 pandemic.    When I saw her in September 2020 she denied any chest pain PND orthopnea.  Her blood pressure had been stable typically running in the 120s to 140s at home.  Recently, her sister died with cardiogenic shock and sepsis.  She has seen Dr. Verlin Fester for allergies.  During that evaluation, her blood pressure was elevated at 150/64.  She had been under increased stress particularly with her sister's death.  I recommended further titration of olmesartan to 30 mg she was continue to take diltiazem CD 300 mg daily.  I recommended that if her blood pressure consistently stayed above 096 systolically further titration to olmesartan 40 mg would be necessary.  I saw Ms. Victorino in the office on April 14, 2019.  Over the last several months she had felt fairly well but for the past 6 to 8 weeks she has noticed more shortness of breath than she had experienced previously.  Approximately 3 weeks ago, she had an episode of chest tightness which resolved.  She denies any significant chest pain since that time and denies any nocturnal symptoms.  She has experienced shortness of breath with activity and has taken a terbutaline tablet which she had had and noted improvement.  She denies any PND orthopnea.  She denies any nocturnal chest tightness.  During her evaluation, her ECG showed sinus rhythm at 70 bpm with an isolated PVC and new inferior and anterolateral T wave inversion of 3 to 5 mm in leads II 3 and F, V4 through V6.  During that evaluation I discussed with her my concern for her change in ECG with new T wave inversion.  She told me she had not had an episode of chest pain for over 3 weeks.  However she  was noticing shortness of breath with less activity.  I discussed options including admission to the hospital on that day versus adding additional medication to her regimen.  At the time she preferred that I do her cardiac catheterization.  I added isosorbide 30 mg daily to her medical regimen as well as metoprolol tartrate 12.5 mg twice daily I recommended reinstitution of aspirin which she had discontinued.  She continues to be on atorvastatin 80 mg daily, olmesartan 30 mg for blood pressure.  She denied any nocturnal symptoms.  I recommended that she return to the office today April 17, 2019 for reassessment of the effects of her medical therapy and repeat ECG.  Over the weekend, she actually has felt significantly better and denies any chest tightness or shortness of breath.  However, her EKG in the office today  now shows sinus bradycardia at 51 bpm with more pronounced T wave abnormality of approximately 10 mm in V3 through V6 in addition to 4 mm inferiorly.  As result, admission to the hospital was recommended and she was admitted with plans to institute heparinization and as long as she is stable plan cardiac catheterization tomorrow or later today if symptoms change.  She underwent successful cardiac catheterization on April 18, 2019 by Dr. Ellyn Hack.  She was found to have moderate mid LAD stenosis of 55%.  In some views this appeared greater.  FFR analysis was negative at 0.85.  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  Medical therapy was recommended.  She was seen in follow-up by Coletta Memos, MD in the office on April 28, 2019 and remained fairly stable.    I saw her in April 2021.  Over the previous 3 months she continued to do well. She specifically denied any episodes of chest pain, PND, orthopnea.  She has continued to be on diltiazem 300 mg daily at bedtime, isosorbide 30 mg daily in  addition to olmesartan currently at 30 mg as well as metoprolol tartrate 25 mg twice a day.  She was unaware of any tachypalpitations.  She is sleeping well.   She continues to be on atorvastatin 80 mg and Zetia 10 mg for aggressive lipid management.  GERD is controlled with pantoprazole.  Her ECG showed complete resolution of her prior markedly abnormal T wave abnormalities.  I discussed possible potential microvascular angina having benefit with ranolazine therapy which could be added in the future.  I further titrated olmesartan to 40 mg daily.  She was off tobacco since January 2021.  When I saw her  in November 2021  she continued to remain stable.  She was no longer smoking.  She denied chest pain.  Blood pressures were typically running in the 120-130 range at home.  She had stopped taking her aspirin on her own and I recommended reinstitution of 81 mg daily and if not daily then perhaps every other day.  With her she was being seen by Dr. Vaughan Browner for pulmonary evaluation.    I last saw her on October 21, 2020.  At home her blood pressure typically has been running in the 120- 130 range and rarely has gone into the 140s.  Her blood pressure yesterday at home was 117/57.  She has tried to reduce her sodium intake but still does get sodium on occasion.  She is currently drinking at least 5 to 6 cups of caffeinated beverage per day.  She is unaware of palpitations.  She denies recurrent anginal symptomatology.  She has not had recent laboratory.  She is scheduled for follow-up pulmonary evaluation in several weeks.  She was bradycardic with a pulse of 51 and was on diltiazem 3 mg daily in addition to metoprolol tartrate 25 mg twice a day day.  She continued to be on olmesartan 40 mg.  With blood pressure lability and known CAD I recommended slight titration of isosorbide from 30 to 45 mg.  She was on atorvastatin 80 and Zetia 10 mg for hyperlipidemia with most recent LDL at 68.  Since I last saw her, she was  evaluated by Coletta Memos in June 2023.  Presently, she states her blood pressure has been stable.  She has COPD/asthma.  At times she experiences some shortness of breath.  She continues  to be on diltiazem 300 mg, isosorbide 45 mg, olmesartan 40 mg and metoprolol tartrate 25 mg twice a day.  She is on Zetia 10 mg and atorvastatin 80 mg for hyperlipidemia.  She is on albuterol, terbutaline and Advair for her lungs.  She takes pantoprazole for GERD and sertraline for depression.  She presents for evaluation.  Past Medical History:  Diagnosis Date   Anxiety    Aortic valve stenosis    Arthritis    Asthma    CAD (coronary artery disease)    single vessel   COPD (chronic obstructive pulmonary disease) (HCC)    GERD (gastroesophageal reflux disease)    Headache    History of stress test 10/2011   Resting images reveal a normal pattern of perfusion in all regions. The post stress myocardial perfusion images show a normal pattern of perfusion in all region. The post stress left ventricle is normal size. the rest left ventricle is normal size, there is no scintigraphic evidence of inductible myocardial ischemia.   Hx of echocardiogram 09/2009   EF >55% Normal Echo   Hyperlipidemia    Hypertension    Migraines    Pneumonia    hx of   Pulmonary emboli (HCC) 5/12   Shortness of breath dyspnea    exertion or eating something shes allergic too   Tobacco abuse     Past Surgical History:  Procedure Laterality Date   ADENOIDECTOMY     BACK SURGERY  2012   CARDIAC CATHETERIZATION  10/13/2007   60% narrowing twin LAD system   DILATION AND CURETTAGE OF UTERUS     FOOT SURGERY  2002,2013   right   INTRAVASCULAR PRESSURE WIRE/FFR STUDY N/A 04/18/2019   Procedure: INTRAVASCULAR PRESSURE WIRE/FFR STUDY;  Surgeon: Leonie Man, MD;  Location: [facility];  Service: Cardiovascular;  Laterality: N/A;   KNEE SURGERY  01/17/2007   right   LEFT HEART CATH AND CORONARY ANGIOGRAPHY N/A  04/18/2019   Procedure: LEFT HEART CATH AND CORONARY ANGIOGRAPHY;  Surgeon: Leonie Man, MD;  Location: [facility];  Service: Cardiovascular;  Laterality: N/A;   SACROILIAC JOINT FUSION Right 03/28/2014   Procedure: SACROILIAC JOINT FUSION;  Surgeon: Sinclair Ship, MD;  Location: [facility];  Service: Orthopedics;  Laterality: Right;  Right sided sacroliac joint fusion   TONSILLECTOMY      Allergies  Allergen Reactions   Cefzil [Cefprozil] Shortness Of Breath   Gluten Meal Shortness Of Breath   Keflex [Cephalexin] Shortness Of Breath and Rash   Lactose Intolerance (Gi) Shortness Of Breath    ALL DAIRY PRODUCTS   Levaquin [Levofloxacin] Shortness Of Breath and Rash   Naproxen Shortness Of Breath    Shortness of breath Shortness of breath   Peanuts [Peanut Oil] Shortness Of Breath   Penicillins Shortness Of Breath   Percocet [Oxycodone-Acetaminophen] Shortness Of Breath   Prednisone Shortness Of Breath and Other (See Comments)    Rash   Red Dye Shortness Of Breath   Sulfa Antibiotics Shortness Of Breath   Sulfacetamide Sodium Shortness Of Breath   Tetracycline Hcl Shortness Of Breath   Tetracyclines & Related Shortness Of Breath   Wheat Bran Shortness Of Breath   Tomato Other (See Comments)    SOB   Influenza Vaccines     Other reaction(s): Other (See Comments) Arm redness and swelling.   Amoxicillin-Pot Clavulanate Rash   Ibuprofen Rash   Methocarbamol Nausea And Vomiting   Moxifloxacin Rash   Penicillin  G Rash   Sulfamethoxazole-Trimethoprim Rash   Tetracycline Rash   Tramadol Rash    Current Outpatient Medications  Medication Sig Dispense Refill   albuterol (PROVENTIL) (2.5 MG/3ML) 0.083% nebulizer solution Take 3 mLs (2.5 mg total) by nebulization every 6 (six) hours as needed for wheezing or shortness of breath. 75 mL 5   ALPRAZolam (XANAX) 1 MG tablet Take 1 mg by mouth 3 (three) times daily as needed for anxiety.      atorvastatin (LIPITOR) 80 MG  tablet TAKE 1 TABLET BY MOUTH EVERY DAY 90 tablet 3   bisoprolol (ZEBETA) 5 MG tablet Take 0.5 tablets (2.5 mg total) by mouth daily. 30 tablet 3   Cholecalciferol (VITAMIN D3) 2000 UNITS TABS Take 2,000 Units by mouth daily.      diltiazem (CARDIZEM CD) 300 MG 24 hr capsule Take 1 capsule (300 mg total) by mouth at bedtime. 90 capsule 2   ezetimibe (ZETIA) 10 MG tablet TAKE 1 TABLET BY MOUTH EVERYDAY AT BEDTIME 90 tablet 0   fluticasone (FLONASE) 50 MCG/ACT nasal spray 2 sprays by Each Nare route daily.     Fluticasone-Salmeterol (ADVAIR) 250-50 MCG/DOSE AEPB Inhale 1 puff into the lungs 2 (two) times daily.     isosorbide mononitrate (IMDUR) 30 MG 24 hr tablet Take 1.5 tablets (45 mg total) by mouth daily. 135 tablet 3   nitroGLYCERIN (NITROSTAT) 0.4 MG SL tablet Place 1 tablet (0.4 mg total) under the tongue every 5 (five) minutes as needed for chest pain. Needs appointment for future refills 25 tablet 0   olmesartan (BENICAR) 40 MG tablet Take 1 tablet (40 mg total) by mouth daily. 14 tablet 0   ondansetron (ZOFRAN) 4 MG tablet Take 4 mg by mouth every 8 (eight) hours as needed for nausea.     pantoprazole (PROTONIX) 40 MG tablet Take 40 mg by mouth 2 (two) times daily.      sertraline (ZOLOFT) 50 MG tablet Take 50 mg by mouth daily.     terbutaline (BRETHINE) 5 MG tablet Take 1 tablet (5 mg total) by mouth every 6 (six) hours as needed. 120 tablet 11   VENTOLIN HFA 108 (90 Base) MCG/ACT inhaler Inhale 2 puffs into the lungs every 6 (six) hours as needed for wheezing.   11   No current facility-administered medications for this visit.    Social History   Socioeconomic History   Marital status: Widowed    Spouse name: Not on file   Number of children: Not on file   Years of education: Not on file   Highest education level: Not on file  Occupational History   Not on file  Tobacco Use   Smoking status: Some Days    Packs/day: 1.00    Types: Cigarettes    Start date: 01/31/1959     Last attempt to quit: 04/15/2019    Years since quitting: 2.9   Smokeless tobacco: Former    Quit date: 01/29/2014  Vaping Use   Vaping Use: Never used  Substance and Sexual Activity   Alcohol use: Yes    Alcohol/week: 4.0 standard drinks of alcohol    Types: 2 Glasses of wine, 2 Cans of beer per week   Drug use: No   Sexual activity: Not on file  Other Topics Concern   Not on file  Social History Narrative   Not on file   Social Determinants of Health   Financial Resource Strain: Not on file  Food Insecurity: Not on file  Transportation Needs: Not on file  Physical Activity: Not on file  Stress: Not on file  Social Connections: Not on file  Intimate Partner Violence: Not on file    Family History  Problem Relation Age of Onset   Heart attack Mother    Diabetes Mother    Asthma Mother    Heart attack Father    Diabetes Sister    Hyperlipidemia Sister    Allergic rhinitis Neg Hx    Angioedema Neg Hx    Eczema Neg Hx    Immunodeficiency Neg Hx    Urticaria Neg Hx    Socially, she is widowed. She has 2 children 5 grandchildren. She does not exercise. She quit smoking 2 years ago. resumed on 08/18/2022 after her dog died.  She is committed to completely discontinue tobacco.   ROS General: Negative; No fevers, chills, or night sweats;  HEENT: Negative; No changes in vision or hearing, sinus congestion, difficulty swallowing Pulmonary: Positive for asthma Cardiovascular: Negative; No chest pain, presyncope, syncope, palpitations GI: positive for GERD; No nausea, vomiting, diarrhea, or abdominal pain GU: Negative; No dysuria, hematuria, or difficulty voiding Musculoskeletal: She tolerated her hip surgery well. Hematologic/Oncology: Negative; no easy bruising, bleeding Endocrine: Negative; no heat/cold intolerance; no diabetes Neuro: Negative; no changes in balance, headaches Skin: Negative; No rashes or skin lesions Psychiatric: Negative; No behavioral problems,  depression Sleep: Negative; No snoring, daytime sleepiness, hypersomnolence, bruxism, restless legs, hypnogognic hallucinations, no cataplexy Other comprehensive 14 point system review is negative.  Nutritionally she cannot have dairy products due to significant milk allergy.  PE BP 122/68   Pulse (!) 55   Ht _0  (1.626 m)   Wt 135 lb 3.2 oz (61.3 kg)   LMP  (LMP Unknown)   SpO2 97%   BMI 23.21 kg/m    Repeat blood pressure by me was 124/70  Wt Readings from Last 3 Encounters:  03/16/22 135 lb 3.2 oz (61.3 kg)  09/16/21 132 lb (59.9 kg)  06/27/21 130 lb 3.2 oz (59.1 kg)    General: Alert, oriented, no distress.  Skin: normal turgor, no rashes, warm and dry HEENT: Normocephalic, atraumatic. Pupils equal round and reactive to light; sclera anicteric; extraocular muscles intact;  Nose without nasal septal hypertrophy Mouth/Parynx benign; Mallinpatti scale Neck: No JVD, no carotid bruits; normal carotid upstroke Lungs: Decreased breath sounds without active wheezing Chest wall: without tenderness to palpitation Heart: PMI not displaced, RRR, s1 s2 normal, 1/6 systolic murmur, no diastolic murmur, no rubs, gallops, thrills, or heaves Abdomen: soft, nontender; no hepatosplenomehaly, BS+; abdominal aorta nontender and not dilated by palpation. Back: no CVA tenderness Pulses 2+ Musculoskeletal: full range of motion, normal strength, no joint deformities Extremities: no clubbing cyanosis or edema, Homan's sign negative  Neurologic: grossly nonfocal; Cranial nerves grossly wnl Psychologic: Normal mood and affect   March 16, 2022 ECG (independently read by me):  Sinus bradycardia at 55, QS V1-3  October 21, 2020 ECG (independently read by me): Sinus bradycardia 51 bpm.  No ectopy.  Normal intervals.    November 2021 ECG (independently read by me): Normal sinus rhythm at 60 bpm.  Poor R wave progression V1 through V3.  Normal intervals.  No ectopy.  April 2021 ECG (independently  read by me): Sinus bradycardia 51 bpm.  Marked improvement in prior T wave abnormality with minimal nonspecific ST change in lead III.     April 17, 2019 ECG (independently read by me): Sinus bradycardia 51 bpm.  LVH by  voltage criteria.  More deeply inverted inferolateral T wave inversion 8 to 10 mm in the anterolateral leads in 2 to 4 mm inferiorly  April 14, 2019 ECG (independently read by me): Normal sinus rhythm at 70 bpm.  New inferior and anterolateral T wave inversion.  PR interval 170 ms, QTc interval 444 ms; an isolated PVC  September 2020 ECG (independently read by me): Sinus bradycardia at 58 bpm.  Nonspecific ST changes.  Poor R wave progression V1 through V3  January 2020 ECG (independently read by me): Normal sinus rhythm at 61 bpm.  No ectopy.  Specific ST change in lead III.  February 2019 ECG (independently read by me): Normal sinus rhythm at 72 bpm.  Nonspecific ST changes inferiorly.  QTc interval 451 ms.  June 2018 ECG (independently read by me): Sinus bradycardia 57 bpm.  No significant ST-T changes.  Normal intervals.  January 2018 ECG (independently read by me): Normal sinus rhythm at 69 bpm.  No ectopy.  Normal intervals.  November 2016 ECG (independently read by me): Normal sinus rhythm at 60 bpm.  No significant ST-T changes.  Normal intervals.  November 2015 ECG (independently read by me): Normal sinus rhythm at 72 bpm.  Normal intervals.  No significant ST-T changes.  October 2014 ECG: Sinus rhythm with occasional isolated PVC. Heart rate 68 beats per minute. QTc interval 440 ms.  LAB:    Latest Ref Rng & Units 06/25/2021    8:51 AM 02/21/2020   11:01 AM 04/18/2019   12:18 AM  BMP  Glucose 70 - 99 mg/dL 108  105  101   BUN 8 - 27 mg/dL _0 Creatinine 0.57 - 1.00 mg/dL 0.62  0.69  0.58   BUN/Creat Ratio 12 - _1 Sodium 134 - 144 mmol/L 141  141  140   Potassium 3.5 - 5.2 mmol/L 4.6  4.7  3.8   Chloride 96 - 106 mmol/L 103  103  106    CO2 20 - 29 mmol/L _2 Calcium 8.7 - 10.3 mg/dL 9.3  9.1  8.6       Latest Ref Rng & Units 06/25/2021    8:51 AM 12/09/2018    9:31 AM 11/03/2017    8:35 AM  Hepatic Function  Total Protein 6.0 - 8.5 g/dL 6.4  6.7  6.1   Albumin 3.7 - 4.7 g/dL 5.0  4.9  4.5   AST 0 - 40 IU/L _3 ALT 0 - 32 IU/L _4 Alk Phosphatase 44 - 121 IU/L 68  63  67   Total Bilirubin 0.0 - 1.2 mg/dL 0.4  0.3  0.2       Latest Ref Rng & Units 06/25/2021    8:51 AM 01/23/2021   11:03 AM 02/21/2020   11:01 AM  CBC  WBC 3.4 - 10.8 x10E3/uL 3.3  7.8  5.0   Hemoglobin 11.1 - 15.9 g/dL 12.8  12.5  11.6   Hematocrit 34.0 - 46.6 % 39.0  37.9  35.5   Platelets 150 - 450 x10E3/uL 131  147.0  122    Lab Results  Component Value Date   MCV 92 06/25/2021   MCV 94.5 01/23/2021   MCV 93 02/21/2020   Lab Results  Component Value Date   TSH 1.380 06/25/2021   No results found for: "HGBA1C"   Lipid  Panel     Component Value Date/Time   CHOL 171 06/25/2021 0851   TRIG 60 06/25/2021 0851   HDL 91 06/25/2021 0851   CHOLHDL 1.9 06/25/2021 0851   CHOLHDL 2.0 04/27/2016 0923   VLDL 10 04/27/2016 0923   LDLCALC 68 06/25/2021 0851    RADIOLOGY: No results found.  IMPRESSION:  1. Essential hypertension   2. SOB (shortness of breath)   3. Coronary artery disease involving native coronary artery of native heart with other form of angina pectoris ([place])   4. Hyperlipidemia LDL goal <70   5. Chronic obstructive pulmonary disease, unspecified COPD type ([place])   6. Prior tobacco abuse   7. Anxiety and depression   8. Gastroesophageal reflux disease without esophagitis     ASSESSMENT AND PLAN: Ms. Favor is a 81 year-old female who had smoked for >50 years and quit smoking over 2 years ago.  Unfortunately, she was under significant stress with the  death of her dog and resumed smoking in May 2018 continued to smoke intermittently but again quit smoking in March 2020.  Remotely she was  found to have she has CAD with a 60% ostial LAD stenosis dating back to 2001 and which again was noted on her repeat catheterization by me in 2009.  We have been aggressively treating her lipids in attempt to induce plaque regression and she has tolerated Lipitor 80 mg in addition to Zetia 10 mg.  Laboratory on December 09, 2018 showed a total cholesterol 159, triglycerides 46, HDL 81, and LDL cholesterol at 68.  A previous coronary CT angiography in December 2018 did not demonstrate significant progression of disease and showed 25-50% stenoses in the LAD and RCA; FFR was negative.  I had seen her in early 2020 and had instituted olmesartan due to continued blood pressure elevation.  She had experienced increased stress at the time of her last evaluation in 30-Dec-2018 due to her sister's death.  At that time her blood pressure was elevated and I further titrated olmesartan.  She resumed tobacco use.  When I had seen her in January 2021 she had experienced more episodes of shortness of breath with activity and she developed new inferolateral T wave inversion worrisome for ischemia.  Due to progressive ECG changes she ultimately was hospitalized and catheterization revealed bifurcation narrowing in a twin like LAD vessel with narrowing of at least 55% or greater in the more inferior branch.  FFR analysis was negative.  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  She has since quit smoking in January.  With discontinuance of tobacco, I believe this is had significant benefit particularly with reference to endothelial dysfunction and potential nicotine mediated vasospasm.  At her last evaluation with me in July 2022 she was continuing to have blood pressure lability.   She was eating with salt but markedly less than previously.  She also has been having 5 to 6 cups of caffeinated beverage per day.  I have recommended reduction of both sodium intake and caffeine.  Presently, she is not having any angina.   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  On physical exam today she has significantly decreased breath sounds but no active wheezing.  She is bradycardic with a pulse of 55 and blood pressure today is stable.  I have recommended slight adjustment to her medication and instead of metoprolol I will switch her to low-dose bisoprolol 2.5 mg for 2 weeks and then titrate to 5 mg depending upon blood pressure and heart  rate response.  This may have improved cardio selectivity.  She continues to be on diltiazem 300 mg daily in addition to olmesartan 40 mg and isosorbide 45 mg for blood pressure and CAD.  She is on albuterol, terbutaline as needed, and Advair.  She continues to be on atorvastatin 80 mg for hyperlipidemia with target LDL less than 70.  She is on sertraline for depression and pantoprazole for GERD.  I am recommending that she undergo a follow-up echo Doppler study for reassessment of LV systolic and diastolic function.  She sees Bing Matter, [place] for primary care.  Lipid studies in March 2023 showed an LDL of 68 and creatinine 0.62 with TSH 1.38.  I will see her in 6 -12 months for reevaluation or sooner as needed.    Troy Sine, MD, [facility]  03/20/2022 9:22 AM

## 2022-03-16 NOTE — Patient Instructions (Signed)
Medication Instructions:  Stop Metoprolol as directed Start Bisoprolol 2.5 mg daily  *If you need a refill on your cardiac medications before your next appointment, please call your pharmacy*   Lab Work: NONE ordered at this time of appointment   If you have labs (blood work) drawn today and your tests are completely normal, you will receive your results only by: MyChart Message (if you have MyChart) OR A paper copy in the mail If you have any lab test that is abnormal or we need to change your treatment, we will call you to review the results.   Testing/Procedures: Your physician has requested that you have an echocardiogram. Echocardiography is a painless test that uses sound waves to create images of your heart. It provides your doctor with information about the size and shape of your heart and how well your heart's chambers and valves are working. This procedure takes approximately one hour. There are no restrictions for this procedure. Please do NOT wear cologne, perfume, aftershave, or lotions (deodorant is allowed). Please arrive 15 minutes prior to your appointment time.     Follow-Up: At [facility], you and your health needs are our priority.  As part of our continuing mission to provide you with exceptional heart care, we have created designated Provider Care Teams.  These Care Teams include your primary Cardiologist (physician) and Advanced Practice Providers (APPs -  Physician Assistants and Nurse Practitioners) who all work together to provide you with the care you need, when you need it.  We recommend signing up for the patient portal called "MyChart".  Sign up information is provided on this After Visit Summary.  MyChart is used to connect with patients for Virtual Visits (Telemedicine).  Patients are able to view lab/test results, encounter notes, upcoming appointments, etc.  Non-urgent messages can be sent to your provider as well.   To learn more about what  you can do with MyChart, go to [email or web address].    Your next appointment:   6 month(s)  The format for your next appointment:   In Person  Provider:   Nicki Guadalajara, MD     Other Instructions   Important Information About Sugar

## 2022-03-20 ENCOUNTER — Encounter: Payer: Self-pay | Admitting: Cardiovascular Disease

## 2022-04-09 ENCOUNTER — Ambulatory Visit (INDEPENDENT_AMBULATORY_CARE_PROVIDER_SITE_OTHER): Payer: Medicare Other

## 2022-04-09 DIAGNOSIS — R0602 Shortness of breath: Secondary | ICD-10-CM | POA: Diagnosis not present

## 2022-04-09 LAB — ECHOCARDIOGRAM COMPLETE
AR max vel: 1.79 cm2
AV Area VTI: 1.64 cm2
AV Area mean vel: 1.39 cm2
AV Mean grad: 8 mmHg
AV Peak grad: 12.3 mmHg
Ao pk vel: 1.75 m/s
Area-P 1/2: 3.12 cm2
MV VTI: 1.47 cm2
S' Lateral: 2.45 cm

## 2022-05-19 ENCOUNTER — Other Ambulatory Visit: Payer: Self-pay | Admitting: Internal Medicine

## 2022-05-19 DIAGNOSIS — J4489 Other specified chronic obstructive pulmonary disease: Secondary | ICD-10-CM

## 2022-05-30 ENCOUNTER — Other Ambulatory Visit: Payer: Self-pay | Admitting: Cardiovascular Disease

## 2022-07-15 ENCOUNTER — Other Ambulatory Visit: Payer: Self-pay | Admitting: Cardiovascular Disease

## 2022-07-24 ENCOUNTER — Telehealth: Payer: Self-pay | Admitting: Cardiovascular Disease

## 2022-07-24 NOTE — Telephone Encounter (Signed)
Patient states swelling to  feet and ankles none in lower legs. Not pitting. Weight gain of approx 10 pounds over several weeks but has been eating "wrong stuff". States her downfall is eating "potato chips". She states sleeping in recliner and wears easy spirit shoes.She states in the recliner since a fall in March and hit ribs on her wingback chair and hasn't been able to lay down to sleep. Followed up with Orthopedic doctor and was just bruising.  When sleeping in recliner feet are straight out.  She states the selling is not as bad as yesterday, No discomfort. Advise cutting chips or other high in sodium. Taking shoes off when sleeping. When sleeping or resting during the day to elevate higher than heart rate. If SOB occurs with rest or weight gain of 2-3 lbs in a day or 5 lbs in a week, call office.

## 2022-07-24 NOTE — Telephone Encounter (Signed)
Pt c/o swelling: STAT is pt has developed SOB within 24 hours  How much weight have you gained and in what time span?   About 10 lbs in a couple of weeks. Tries to maintain at 130, but  140, states she does have a heavy house coat on.  If swelling, where is the swelling located? Feet and ankles  Are you currently taking a fluid pill? No   Are you currently SOB? Allergic to so much, states she gets short winded if she eats the wrong thing. Does no feel as if it is related to the swelling. States she has had issues with breathing for a long time.   Do you have a log of your daily weights (if so, list)?   Have you gained 3 pounds in a day or 5 pounds in a week?   Have you traveled recently? No  Pt states that her feet have never swelled up like this before States it is just her ankles and feet. They do not hurt, not pitted - comes back up. Pt states she just noticed this morning.

## 2022-08-11 ENCOUNTER — Telehealth: Payer: Self-pay | Admitting: Cardiovascular Disease

## 2022-08-11 NOTE — Telephone Encounter (Signed)
Pt c/o swelling: STAT is pt has developed SOB within 24 hours  If swelling, where is the swelling located? Ankles and feet  How much weight have you gained and in what time span? NA  Have you gained 3 pounds in a day or 5 pounds in a week? No  Do you have a log of your daily weights (if so, list)? No  Are you currently taking a fluid pill? No  Are you currently SOB? No  Have you traveled recently? No  Pt states that ankles and feet are swollen, more of the left than the right.

## 2022-08-11 NOTE — Telephone Encounter (Signed)
Patient states swelling in bilateral ankles and feet, worse in Left.  This started since April 26.  She states they have gone down since then .   She is all over with discussion.  From discussion deem that she has had the swelling since April conversation.  She still is eating high sodium foods and states "I wont do again" but then repeats.  She states stopped chips but eating spam now .  Advised to stop eating spam.  Advised again to do daily weights and keep a log.  Call if weight gain of 2-3 lbs in a day or 5 lbs in a week or increased SOB.  She is to call if worsening swelling but OK if still improving.  She again state understanding.

## 2022-08-13 ENCOUNTER — Telehealth: Payer: Self-pay | Admitting: Cardiovascular Disease

## 2022-08-13 NOTE — Telephone Encounter (Signed)
Pt reports that she has dropped 2 pounds, but still has swelling.   Swelling gets better overnight, then throughout the day the swelling comes back. She has sob, but it is not really any different than it usually is.   Pt reports that the skin on her ankles and feet has become a little rough and a little pink. Recommended she may need to see her PCP if she can get in with them tomorrow; we don't have any openings until next week. She would like to go ahead and get an appt herer.   Recommended that she elevate her legs several times throughout the day. Get properly fitted for some compression hose- wear them intermittently. Decrease sodium. Drink water.   Made appt with DOD for 08/19/22 at 0830. Given ER precautions. She verbalized understanding.

## 2022-08-13 NOTE — Telephone Encounter (Signed)
Patient is requesting a callback from the LPN she spoke with yesterday regarding her swelling. She states the swelling is still present, but she has actually lost weight. Please advise.

## 2022-08-16 NOTE — Progress Notes (Deleted)
Cardiology Office Note:   Date:  08/16/2022  NAME:  Judith Stevens    MRN: [id number] DOB:  1940-06-10   PCP:  Richmond Campbell., PA-C  Cardiologist:  Nicki Guadalajara, MD  Electrophysiologist:  None   Referring MD: Richmond Campbell., PA-C   No chief complaint on file. ***  History of Present Illness:   Judith Stevens is a 82 y.o. female with a hx of CAD, HTN, asthma who presents for follow-up of SOB.   Problem List CAD -mid LAD 55%, negative by FFR 0.85 -anomalous LCX off RCA 2. Asthma  3. HTN 4. HLD  Past Medical History: Past Medical History:  Diagnosis Date   Anxiety    Aortic valve stenosis    Arthritis    Asthma    CAD (coronary artery disease)    single vessel   COPD (chronic obstructive pulmonary disease) (HCC)    GERD (gastroesophageal reflux disease)    Headache    History of stress test 10/2011   Resting images reveal a normal pattern of perfusion in all regions. The post stress myocardial perfusion images show a normal pattern of perfusion in all region. The post stress left ventricle is normal size. the rest left ventricle is normal size, there is no scintigraphic evidence of inductible myocardial ischemia.   Hx of echocardiogram 09/2009   EF >55% Normal Echo   Hyperlipidemia    Hypertension    Migraines    Pneumonia    hx of   Pulmonary emboli (HCC) 5/12   Shortness of breath dyspnea    exertion or eating something shes allergic too   Tobacco abuse     Past Surgical History: Past Surgical History:  Procedure Laterality Date   ADENOIDECTOMY     BACK SURGERY  2012   CARDIAC CATHETERIZATION  10/13/2007   60% narrowing twin LAD system   CORONARY PRESSURE/FFR STUDY N/A 04/18/2019   Procedure: INTRAVASCULAR PRESSURE WIRE/FFR STUDY;  Surgeon: Marykay Lex, MD;  Location: [facility] INVASIVE CV LAB;  Service: Cardiovascular;  Laterality: N/A;   DILATION AND CURETTAGE OF UTERUS     FOOT SURGERY  2002,2013   right   KNEE SURGERY  01/17/2007    right   LEFT HEART CATH AND CORONARY ANGIOGRAPHY N/A 04/18/2019   Procedure: LEFT HEART CATH AND CORONARY ANGIOGRAPHY;  Surgeon: Marykay Lex, MD;  [facility] INVASIVE CV LAB;  Service: Cardiovascular;  Laterality: N/A;   SACROILIAC JOINT FUSION Right 03/28/2014   Procedure: SACROILIAC JOINT FUSION;  Surgeon: Emilee Hero, MD;  Location: [facility] OR;  Service: Orthopedics;  Laterality: Right;  Right sided sacroliac joint fusion   TONSILLECTOMY      Current Medications: No outpatient medications have been marked as taking for the 08/19/22 encounter (Appointment) with Sande Rives, MD.     Allergies:    Cefzil [cefprozil], Gluten meal, Keflex [cephalexin], Lactose intolerance (gi), Levaquin [levofloxacin], Naproxen, Peanuts [peanut oil], Penicillins, Percocet [oxycodone-acetaminophen], Prednisone, Red dye, Sulfa antibiotics, Sulfacetamide sodium, Tetracycline hcl, Tetracyclines & related, Wheat, Tomato, Influenza vaccines, Amoxicillin-pot clavulanate, Ibuprofen, Methocarbamol, Moxifloxacin, Penicillin g, Sulfamethoxazole-trimethoprim, Tetracycline, and Tramadol   Social History: Social History   Socioeconomic History   Marital status: Widowed    Spouse name: Not on file   Number of children: Not on file   Years of education: Not on file   Highest education level: Not on file  Occupational History   Not on file  Tobacco Use   Smoking status: Some Days  Packs/day: 1    Types: Cigarettes    Start date: 01/31/1959    Last attempt to quit: 04/15/2019    Years since quitting: 3.3   Smokeless tobacco: Former    Quit date: 01/29/2014  Vaping Use   Vaping Use: Never used  Substance and Sexual Activity   Alcohol use: Yes    Alcohol/week: 4.0 standard drinks of alcohol    Types: 2 Glasses of wine, 2 Cans of beer per week   Drug use: No   Sexual activity: Not on file  Other Topics Concern   Not on file  Social History Narrative   Not on file   Social Determinants of  Health   Financial Resource Strain: Not on file  Food Insecurity: Not on file  Transportation Needs: Not on file  Physical Activity: Not on file  Stress: Not on file  Social Connections: Not on file     Family History: The patient's ***family history includes Asthma in her mother; Diabetes in her mother and sister; Heart attack in her father and mother; Hyperlipidemia in her sister. There is no history of Allergic rhinitis, Angioedema, Eczema, Immunodeficiency, or Urticaria.  ROS:   All other ROS reviewed and negative. Pertinent positives noted in the HPI.     EKGs/Labs/Other Studies Reviewed:   The following studies were personally reviewed by me today:  EKG:  EKG is *** ordered today.  The ekg ordered today demonstrates ***, and was personally reviewed by me.   Recent Labs: No results found for requested labs within last 365 days.   Recent Lipid Panel    Component Value Date/Time   CHOL 171 06/25/2021 0851   TRIG 60 06/25/2021 0851   HDL 91 06/25/2021 0851   CHOLHDL 1.9 06/25/2021 0851   CHOLHDL 2.0 04/27/2016 0923   VLDL 10 04/27/2016 0923   LDLCALC 68 06/25/2021 0851    Physical Exam:   VS:  LMP  (LMP Unknown)    Wt Readings from Last 3 Encounters:  03/16/22 135 lb 3.2 oz (61.3 kg)  09/16/21 132 lb (59.9 kg)  06/27/21 130 lb 3.2 oz (59.1 kg)    General: Well nourished, well developed, in no acute distress Head: Atraumatic, normal size  Eyes: PEERLA, EOMI  Neck: Supple, no JVD Endocrine: No thryomegaly Cardiac: Normal S1, S2; RRR; no murmurs, rubs, or gallops Lungs: Clear to auscultation bilaterally, no wheezing, rhonchi or rales  Abd: Soft, nontender, no hepatomegaly  Ext: No edema, pulses 2+ Musculoskeletal: No deformities, BUE and BLE strength normal and equal Skin: Warm and dry, no rashes   Neuro: Alert and oriented to person, place, time, and situation, CNII-XII grossly intact, no focal deficits  Psych: Normal mood and affect   ASSESSMENT:    Judith Stevens is a 82 y.o. female who presents for the following: No diagnosis found.  PLAN:   There are no diagnoses linked to this encounter.  {Are you ordering a CV Procedure (e.g. stress test, cath, DCCV, TEE, etc)?   Press F2        :[id number]}  Disposition: No follow-ups on file.  Medication Adjustments/Labs and Tests Ordered: Current medicines are reviewed at length with the patient today.  Concerns regarding medicines are outlined above.  No orders of the defined types were placed in this encounter.  No orders of the defined types were placed in this encounter.   There are no Patient Instructions on file for this visit.   Time Spent with Patient: I have spent a  total of *** minutes with patient reviewing hospital notes, telemetry, EKGs, labs and examining the patient as well as establishing an assessment and plan that was discussed with the patient.  > 50% of time was spent in direct patient care.  Signed, Lenna Gilford. Flora Lipps, MD, [facility]  [facility]  [address] [place], [place] [address] [phone]  08/16/2022 1:00 PM

## 2022-08-19 ENCOUNTER — Ambulatory Visit: Payer: Medicare Other | Admitting: Cardiovascular Disease

## 2022-08-19 DIAGNOSIS — I2510 Atherosclerotic heart disease of native coronary artery without angina pectoris: Secondary | ICD-10-CM

## 2022-08-19 DIAGNOSIS — R0602 Shortness of breath: Secondary | ICD-10-CM

## 2022-08-19 NOTE — Telephone Encounter (Signed)
Patient wants a call back to discuss compression stockings.  Patient noted her left leg swells worse than her right but neither hurt.

## 2022-08-19 NOTE — Telephone Encounter (Signed)
Patient missed her appointment this morning and stated she don't think  she needs to be seen right now state her feet still has swelling. Its not painful.Left foot more than the right and she states she never had this problem until she fell a month ago. She states she has not gained weight and she has been elevating her feet and watching her salt intake. She tried the compression stockings but state they are really tight on her. Advised she can get a bigger size for the compression stockings. Continue to elevate her feet and to monitor salt intake. She is aware to give [place] a call if she gained 3 lbs in 24hrs and 5lbs in a week. Will forward to MD for more recommendations. She states she will be seen earlier if you think so.

## 2022-08-25 ENCOUNTER — Telehealth: Payer: Self-pay | Admitting: Cardiovascular Disease

## 2022-08-25 ENCOUNTER — Ambulatory Visit: Payer: Medicare Other | Attending: Physician Assistant | Admitting: Physician Assistant

## 2022-08-25 ENCOUNTER — Encounter: Payer: Self-pay | Admitting: Physician Assistant

## 2022-08-25 VITALS — BP 138/60 | HR 51 | Ht 64.0 in | Wt 140.6 lb

## 2022-08-25 DIAGNOSIS — R600 Localized edema: Secondary | ICD-10-CM | POA: Diagnosis not present

## 2022-08-25 DIAGNOSIS — E785 Hyperlipidemia, unspecified: Secondary | ICD-10-CM | POA: Diagnosis not present

## 2022-08-25 DIAGNOSIS — I10 Essential (primary) hypertension: Secondary | ICD-10-CM | POA: Insufficient documentation

## 2022-08-25 DIAGNOSIS — I2510 Atherosclerotic heart disease of native coronary artery without angina pectoris: Secondary | ICD-10-CM | POA: Diagnosis not present

## 2022-08-25 MED ORDER — OLMESARTAN MEDOXOMIL 40 MG PO TABS: 40.0000 mg | ORAL_TABLET | Freq: Every day | ORAL | 0 refills | Status: DC

## 2022-08-25 NOTE — Telephone Encounter (Signed)
Patient states she discussed starting on a BP medication but she does not remember the name of it. She would like a call back to discuss.

## 2022-08-25 NOTE — Telephone Encounter (Signed)
Spoke to patient she stated for the past 1 week she has swelling in both ankles and feet.She has decreased salt.She has gained 9 lbs.No sob.Appointment scheduled with Azalee Course PA this morning at 11:20 am.

## 2022-08-25 NOTE — Progress Notes (Signed)
Cardiology Office Note:    Date:  08/25/2022   ID:  Judith Stevens, DOB March 31, 1940, MRN [id number]  PCP:  Richmond Campbell PA-C   [facility] Providers Cardiologist:  Nicki Guadalajara, MD     Referring MD: Richmond Campbell., PA-C   Chief Complaint  Patient presents with   Follow-up    Seen for Dr. Tresa Endo   Leg Swelling    History of Present Illness:    Judith Stevens is a 82 y.o. female with a hx of CAD, GERD, HTN, HLD, tobacco abuse and h/o PE.  She had a cardiac catheterization in [place] around 2001 which revealed 60% ostial LAD lesion.  Repeat cardiac catheterization in 2009 did not show significant interval changes.  Myoview in 2015 was unchanged with normal perfusion.  Last echocardiogram obtained on 04/29/2016 showed EF 55 to 60%, mild AI, mildly dilated left atrium.  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  FFR was negative.  She did develop T wave inversion in the anterior leads and a subsequently underwent cardiac catheterization on 04/18/2019 by Dr. Herbie Baltimore which revealed 55% mid LAD lesion, FFR was negative at 0.85, medical therapy was recommended.  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  Most recent echocardiogram obtained on 04/09/2022 demonstrated EF 65 to 70%, grade 1 DD, no regional wall motion abnormality, RV systolic pressure 31.9 mmHg, trivial MR, trivial AI, mild aortic stenosis.  Patient contact cardiology service on 07/24/2026 complaining of feet and ankle swelling.  Symptoms started after she fell on her left side against a sofa.  She has significant bruising and pain in her left  flank.  It took several weeks before her bruising completely resolved.  Immediately after the event, she says she was in too much pain to sleep in her bed, therefore she was sleeping in her recliner.  She does started noticing feet and ankle swelling after that.  She also complains of 10 pound weight gain after eating more salty stuff.  Her swelling would become worse by the end of the day and better after overnight sleep.  Leg edema improved the most when she sleeping in her own bed rather than the recliner.  I suspect a component of venous insufficiency.  She says her left lower extremity has been swollen more than the right lower extremity.  I will obtain a venous Doppler to rule out DVT.  We discussed the importance of keep daily fluid intake between 32 to 64 ounces per day.  I recommended conservative management including leg elevation above the level of the heart.  EKG today shows sinus bradycardia with heart rate of 51 bpm.  She asked me to refill her olmesartan and metoprolol tartrate.  Interestingly, our medication list indicated she is on bisoprolol but not on metoprolol tartrate.  I wanted to confirm that she is not on 2 beta-blockers.  We called her pharmacy and her pharmacy confirmed as she only filled bisoprolol and not metoprolol tartrate.  She is not sure who gave her the metoprolol, she says she called me the name off of the medication bottle.  I asked her to go  home and check her medication bottles, if she has been taking metoprolol on top of the bisoprolol, she should stop the metoprolol immediately.  She has some dizziness when she gets up too quickly, symptom is consistent with orthostatic changes.  She can follow-up with Dr. Tresa Endo in 1 month for reassessment.   Past Medical History:  Diagnosis Date   Anxiety    Aortic valve stenosis    Arthritis    Asthma    CAD (coronary artery disease)    single vessel   COPD (chronic obstructive pulmonary disease) (HCC)    GERD (gastroesophageal  reflux disease)    Headache    History of stress test 10/2011   Resting images reveal a normal pattern of perfusion in all regions. The post stress myocardial perfusion images show a normal pattern of perfusion in all region. The post stress left ventricle is normal size. the rest left ventricle is normal size, there is no scintigraphic evidence of inductible myocardial ischemia.   Hx of echocardiogram 09/2009   EF >55% Normal Echo   Hyperlipidemia    Hypertension    Migraines    Pneumonia    hx of   Pulmonary emboli (HCC) 5/12   Shortness of breath dyspnea    exertion or eating something shes allergic too   Tobacco abuse     Past Surgical History:  Procedure Laterality Date   ADENOIDECTOMY     BACK SURGERY  2012   CARDIAC CATHETERIZATION  10/13/2007   60% narrowing twin LAD system   CORONARY PRESSURE/FFR STUDY N/A 04/18/2019   Procedure: INTRAVASCULAR PRESSURE WIRE/FFR STUDY;  Surgeon: Marykay Lex, MD;  Location: [facility] INVASIVE CV LAB;  Service: Cardiovascular;  Laterality: N/A;   DILATION AND CURETTAGE OF UTERUS     FOOT SURGERY  2002,2013   right   KNEE SURGERY  01/17/2007   right   LEFT HEART CATH AND CORONARY ANGIOGRAPHY N/A 04/18/2019   Procedure: LEFT HEART CATH AND CORONARY ANGIOGRAPHY;  Surgeon: Marykay Lex, MD;  Location: [facility] INVASIVE CV LAB;  Service: Cardiovascular;  Laterality: N/A;   SACROILIAC JOINT FUSION Right 03/28/2014   Procedure: SACROILIAC JOINT FUSION;  Surgeon: Emilee Hero, MD;  Location: [facility] OR;  Service: Orthopedics;  Laterality: Right;  Right sided sacroliac joint fusion   TONSILLECTOMY      Current Medications: Current Meds  Medication Sig   butalbital-acetaminophen-caffeine (FIORICET) 50-325-40 MG tablet Take 1 tablet by mouth every 4 (four) hours as needed for migraine.   HYDROcodone-acetaminophen (NORCO/VICODIN) 5-325 MG tablet Take 1 tablet by mouth every 6 (six) hours as needed.   ondansetron (ZOFRAN-ODT) 8 MG disintegrating  tablet Take 8 mg by mouth every 8 (eight) hours as needed for nausea or vomiting.     Allergies:   Cefzil [cefprozil], Gluten meal, Keflex [cephalexin], Lactose intolerance (gi), Levaquin [levofloxacin], Naproxen, Peanuts [peanut oil], Penicillins, Percocet [oxycodone-acetaminophen], Prednisone, Red dye, Sulfa antibiotics, Sulfacetamide sodium, Tetracycline hcl, Tetracyclines & related, Wheat, Tomato, Influenza vaccines, Amoxicillin-pot clavulanate, Ibuprofen, Methocarbamol, Moxifloxacin, Penicillin g, Sulfamethoxazole-trimethoprim, Tetracycline, and Tramadol   Social History   Socioeconomic History   Marital status: Widowed    Spouse name: Not on file   Number of children: Not on file   Years of education: Not on file   Highest education level: Not on file  Occupational History   Not on file  Tobacco Use   Smoking status: Some Days    Packs/day: 1    Types: Cigarettes    Start date: 01/31/1959  Last attempt to quit: 04/15/2019    Years since quitting: 3.3   Smokeless tobacco: Former    Quit date: 01/29/2014  Vaping Use   Vaping Use: Never used  Substance and Sexual Activity   Alcohol use: Yes    Alcohol/week: 4.0 standard drinks of alcohol    Types: 2 Glasses of wine, 2 Cans of beer per week   Drug use: No   Sexual activity: Not on file  Other Topics Concern   Not on file  Social History Narrative   Not on file   Social Determinants of Health   Financial Resource Strain: Not on file  Food Insecurity: Not on file  Transportation Needs: Not on file  Physical Activity: Not on file  Stress: Not on file  Social Connections: Not on file     Family History: The patient's family history includes Asthma in her mother; Diabetes in her mother and sister; Heart attack in her father and mother; Hyperlipidemia in her sister. There is no history of Allergic rhinitis, Angioedema, Eczema, Immunodeficiency, or Urticaria.  ROS:   Please see the history of present illness.     All  other systems reviewed and are negative.  EKGs/Labs/Other Studies Reviewed:    The following studies were reviewed today:  Echo 04/09/2022 1. Left ventricular ejection fraction, by estimation, is 65 to 70%. Left  ventricular ejection fraction by 3D volume is 68 %. The left ventricle has  normal function. The left ventricle has no regional wall motion  abnormalities. Left ventricular diastolic   parameters are consistent with Grade I diastolic dysfunction (impaired  relaxation). Elevated left ventricular end-diastolic pressure. The E/e' is  15. The average left ventricular global longitudinal strain is -20.0 %.  The global longitudinal strain is  normal.   2. Right ventricular systolic function is normal. The right ventricular  size is normal. There is normal pulmonary artery systolic pressure. The  estimated right ventricular systolic pressure is 31.9 mmHg.   3. The mitral valve is abnormal. Trivial mitral valve regurgitation.   4. The aortic valve is tricuspid. There is moderate calcification of the  aortic valve. There is mild thickening of the aortic valve. Aortic valve  regurgitation is trivial. Mild aortic valve stenosis. Aortic valve area,  by VTI measures 1.64 cm. Aortic  valve mean gradient measures 8.0 mmHg. Aortic valve Vmax measures 1.75  m/s. Peak gradient 12 mmHg, DI is 0.47.   5. The inferior vena cava is normal in size with greater than 50%  respiratory variability, suggesting right atrial pressure of 3 mmHg.   Comparison(s): Changes from prior study are noted. 11/07/2020: LVEF 60-65%,  no aortic stenosis.    EKG:  EKG is ordered today.  The ekg ordered today demonstrates sinus bradycardia, heart rate 51 bpm, poor R wave progression in the anterior leads.  Recent Labs: No results found for requested labs within last 365 days.  Recent Lipid Panel    Component Value Date/Time   CHOL 171 06/25/2021 0851   TRIG 60 06/25/2021 0851   HDL 91 06/25/2021 0851   CHOLHDL  1.9 06/25/2021 0851   CHOLHDL 2.0 04/27/2016 0923   VLDL 10 04/27/2016 0923   LDLCALC 68 06/25/2021 0851     Risk Assessment/Calculations:           Physical Exam:    VS:  BP 138/60 (BP Location: Right Arm, Patient Position: Sitting, Cuff Size: Normal)   Pulse (!) 51   Ht 5\' 4"  (1.626 m)  Wt 140 lb 9.6 oz (63.8 kg)   LMP  (LMP Unknown)   SpO2 96%   BMI 24.13 kg/m         Wt Readings from Last 3 Encounters:  08/25/22 140 lb 9.6 oz (63.8 kg)  03/16/22 135 lb 3.2 oz (61.3 kg)  09/16/21 132 lb (59.9 kg)     GEN:  Well nourished, well developed in no acute distress HEENT: Normal NECK: No JVD; No carotid bruits LYMPHATICS: No lymphadenopathy CARDIAC: RRR, no murmurs, rubs, gallops RESPIRATORY:  Clear to auscultation without rales, wheezing or rhonchi  ABDOMEN: Soft, non-tender, non-distended MUSCULOSKELETAL:  No edema; No deformity  SKIN: Warm and dry NEUROLOGIC:  Alert and oriented x 3 PSYCHIATRIC:  Normal affect   ASSESSMENT:    1. Leg edema   2. Coronary artery disease involving native coronary artery of native heart without angina pectoris   3. Essential hypertension   4. Hyperlipidemia LDL goal <70    PLAN:    In order of problems listed above:  Leg edema: Left lower extremity is worse than the right lower extremity, will obtain venous Doppler.  Symptoms started near the end of April after she fell against her sofa and the bruised her left flank, since then, she has been sleeping in the recliner rather than her bed.  I suspect some of her lower extremity edema is related to venous stasis since her body is not flat when she is sleeping in the recliner.  Symptom is worse throughout the day and better after overnight sleep.  I recommended conservative management with leg elevation above the level of the heart during sleep.  I do not think she necessarily need a diuretic at this time.  CAD: Denies any chest pain.  Recent echocardiogram in January 2024 showed normal  EF  Hypertension: Blood pressure stable.  Will need to clarify his medication.  She has been to refill her metoprolol tartrate, however our medication list indicates she should not be on metoprolol tartrate, instead she is on bisoprolol 2.5 mg daily.  She says she Coppi the metoprolol tartrate name off of her medication bottles she has been taking.  I have called her pharmacy and spoke with her pharmacist who told me she has not picked up a metoprolol tartrate from their pharmacy recently.  I asked her to confirm her medication bottles when she get home.  If she is taking metoprolol on top of the bisoprolol, she should stop the metoprolol immediately.  Hyperlipidemia: On Lipitor.           Medication Adjustments/Labs and Tests Ordered: Current medicines are reviewed at length with the patient today.  Concerns regarding medicines are outlined above.  Orders Placed This Encounter  Procedures   EKG 12-Lead   VAS [place] LOWER EXTREMITY VENOUS (DVT)   Meds ordered this encounter  Medications   olmesartan (BENICAR) 40 MG tablet    Sig: Take 1 tablet (40 mg total) by mouth daily.    Dispense:  30 tablet    Refill:  0    Patient Instructions  Medication Instructions:  PLEASE GIVE [place] A CALL ONCE YOU FIND OUT ABOUT THE METOPROLOL MEDICATION, IF YOU DO HAVE METOPROLOL AT HOME, DO NOT TAKE IT! *If you need a refill on your cardiac medications before your next appointment, please call your pharmacy*  If you have labs (blood work) drawn today and your tests are completely normal, you will receive your results only by: MyChart Message (if you have MyChart) OR  A paper copy in the mail If you have any lab test that is abnormal or we need to change your treatment, we will call you to review the results.   Testing/Procedures: Your physician has requested that you have a lower or upper extremity venous duplex. This test is an ultrasound of the veins in the legs or arms. It looks at venous blood flow  that carries blood from the heart to the legs or arms. Allow one hour for a Lower Venous exam. Allow thirty minutes for an Upper Venous exam. There are no restrictions or special instructions.     Follow-Up: At [facility], you and your health needs are our priority.  As part of our continuing mission to provide you with exceptional heart care, we have created designated Provider Care Teams.  These Care Teams include your primary Cardiologist (physician) and Advanced Practice Providers (APPs -  Physician Assistants and Nurse Practitioners) who all work together to provide you with the care you need, when you need it.  We recommend signing up for the patient portal called "MyChart".  Sign up information is provided on this After Visit Summary.  MyChart is used to connect with patients for Virtual Visits (Telemedicine).  Patients are able to view lab/test results, encounter notes, upcoming appointments, etc.  Non-urgent messages can be sent to your provider as well.   To learn more about what you can do with MyChart, go to [email or web address].    Your next appointment:   1 month(s)  Provider:   Nicki Guadalajara, MD         Signed, Azalee Course, [place]  08/25/2022 3:01 PM    [facility]

## 2022-08-25 NOTE — Telephone Encounter (Signed)
Called patient, she states she lost her paperwork from today- advised she was at home and wanted to know what medication to look for not to take. I advised per AVS it was the Metoprolol- and advised not to take this. Patient verbalized understanding. Will route to PA to make aware.  Offered to mail patient another copy of her AVS since she lost hers, but patient declined stated she just needed to know what medication to look for.

## 2022-08-25 NOTE — Telephone Encounter (Signed)
Pt c/o swelling: STAT is pt has developed SOB within 24 hours  If swelling, where is the swelling located? Feet and ankles  How much weight have you gained and in what time span? She did  not know if she have gained weight  Have you gained 3 pounds in a day or 5 pounds in a week?   Do you have a log of your daily weights (if so, list)?   Are you currently taking a fluid pill? no  Are you currently SOB? no  Have you traveled recently?  No- patient wanted to be seen asap-I made her an appointment for Friday- would like to be seen sooner if possible

## 2022-08-25 NOTE — Patient Instructions (Signed)
Medication Instructions:  PLEASE GIVE [place] A CALL ONCE YOU FIND OUT ABOUT THE METOPROLOL MEDICATION, IF YOU DO HAVE METOPROLOL AT HOME, DO NOT TAKE IT! *If you need a refill on your cardiac medications before your next appointment, please call your pharmacy*  If you have labs (blood work) drawn today and your tests are completely normal, you will receive your results only by: MyChart Message (if you have MyChart) OR A paper copy in the mail If you have any lab test that is abnormal or we need to change your treatment, we will call you to review the results.   Testing/Procedures: Your physician has requested that you have a lower or upper extremity venous duplex. This test is an ultrasound of the veins in the legs or arms. It looks at venous blood flow that carries blood from the heart to the legs or arms. Allow one hour for a Lower Venous exam. Allow thirty minutes for an Upper Venous exam. There are no restrictions or special instructions.     Follow-Up: At [facility], you and your health needs are our priority.  As part of our continuing mission to provide you with exceptional heart care, we have created designated Provider Care Teams.  These Care Teams include your primary Cardiologist (physician) and Advanced Practice Providers (APPs -  Physician Assistants and Nurse Practitioners) who all work together to provide you with the care you need, when you need it.  We recommend signing up for the patient portal called "MyChart".  Sign up information is provided on this After Visit Summary.  MyChart is used to connect with patients for Virtual Visits (Telemedicine).  Patients are able to view lab/test results, encounter notes, upcoming appointments, etc.  Non-urgent messages can be sent to your provider as well.   To learn more about what you can do with MyChart, go to [email or web address].    Your next appointment:   1 month(s)  Provider:   Nicki Guadalajara, MD

## 2022-08-28 ENCOUNTER — Ambulatory Visit: Payer: Medicare Other | Admitting: Adult Health

## 2022-09-04 ENCOUNTER — Ambulatory Visit (HOSPITAL_COMMUNITY)
Admission: RE | Admit: 2022-09-04 | Discharge: 2022-09-04 | Disposition: A | Discharge status: Home / Self Care | Payer: Medicare Other | Source: Ambulatory Visit | Attending: Internal Medicine | Admitting: Internal Medicine

## 2022-09-04 DIAGNOSIS — R600 Localized edema: Secondary | ICD-10-CM | POA: Diagnosis present

## 2022-09-23 ENCOUNTER — Encounter: Payer: Self-pay | Admitting: Cardiovascular Disease

## 2022-09-23 ENCOUNTER — Ambulatory Visit: Payer: Medicare Other | Attending: Cardiovascular Disease | Admitting: Cardiovascular Disease

## 2022-09-23 VITALS — BP 126/86 | HR 58 | Ht 64.0 in | Wt 140.0 lb

## 2022-09-23 DIAGNOSIS — Z131 Encounter for screening for diabetes mellitus: Secondary | ICD-10-CM | POA: Diagnosis present

## 2022-09-23 DIAGNOSIS — Z720 Tobacco use: Secondary | ICD-10-CM

## 2022-09-23 DIAGNOSIS — I10 Essential (primary) hypertension: Secondary | ICD-10-CM

## 2022-09-23 DIAGNOSIS — J449 Chronic obstructive pulmonary disease, unspecified: Secondary | ICD-10-CM

## 2022-09-23 DIAGNOSIS — K219 Gastro-esophageal reflux disease without esophagitis: Secondary | ICD-10-CM

## 2022-09-23 DIAGNOSIS — E785 Hyperlipidemia, unspecified: Secondary | ICD-10-CM

## 2022-09-23 DIAGNOSIS — I25118 Atherosclerotic heart disease of native coronary artery with other forms of angina pectoris: Secondary | ICD-10-CM | POA: Diagnosis not present

## 2022-09-23 DIAGNOSIS — R600 Localized edema: Secondary | ICD-10-CM

## 2022-09-23 MED ORDER — HYDROCHLOROTHIAZIDE 12.5 MG PO CAPS: 12.5000 mg | ORAL_CAPSULE | Freq: Every day | ORAL | 3 refills | Status: DC

## 2022-09-23 NOTE — Progress Notes (Signed)
Patient ID: Judith Stevens, female   DOB: April 06, 1940, 82 y.o.   MRN: [id number]      HPI: Judith Stevens is a 82 y.o. female who presents for a 6 month follow-up cardiology evaluation.   Judith Stevens has CAD and in 2001 underwent cardiac catheterization while in [place] which showed a 60% ostial narrowing of her LAD. Repeat catheterization by me in 2009 did not show any significant interval change. Unfortunately, she has continued her ongoing tobacco habit but is now smoking significantly reduced at less than one quarter pack per day.  She has a history of hypertension as well as hyperlipidemia.  A nuclear perfusion study in August 2013 which was unchanged from 2 years previously and continued to show normal perfusion.   In December 2015 prior to undergoing right hip surgery a 2 year follow-up nuclear perfusion study on 03/13/2014 was performed preoperatively.  This continued to show normal perfusion without scar or ischemia.  She tolerated her hip surgery well.  She had smoked for 50 years before finally quitting smoking when I had seen her last.  When I last saw he 2 months ago, she had complained of experiencing increasing shortness of breath of 6 weeks duration that was not consistently exertionally precipitated.   She admitted to fatigue.  She is unaware of palpitations.  She has hyperlipidemia and has been taking atorvastatin 80 mg and Zetia 10 mg in attempt to continue to induce plaque regression.    An echo Doppler study on 02/18/2015 showed an EF of 55-60%, moderate thickening and calcification of a trileaflet aortic valve, consistent with sclerosis but without stenosis and mitral annular calcification.  Laboratory revealed excellent lipid studies with a total cholesterol 149, triglycerides 55, HDL 85 and her LDL was now 53.  Thyroid function studies were normal.  Her renal function was excellent.  LFTs were normal.  Vitamin D was normal at 41 and her vitamin B-12 level was normal at  448.    She was seen by Theodore Demark in October 2017 with complaints of dyspnea on exertion.  She has a history of asthma as well as numerous allergies to foods and some drugs.  She tells me she had the flu in early January 2018.  When I last saw her in January 2018.  She had stop smoking.  She did have complaints of dyspnea.  I recommended a follow-up echo Doppler study which was done on 04/29/2016.  This showed normal LV function and normal diastolic parameters.  The aortic valve was mildly calcified and was without stenosis.  There was mild aortic insufficiency.  Her mitral valve annulus was calcified and leaflets were mildly thickened.  Her left atrium was mildly dilated.   When I saw her in November 2018 she had been taking caring for her sister who had undergone aortic stent grafting.  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  When I saw her, I recommended complete smoking cessation and suggested a pulmonary evaluation with Dr. Kathrin Penner.   She underwent a coronary CT, which showed a calcium score of 81 which was 93 percentile for age and sex matched control.  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  There is right dominance.  There was diffuse calcified plaque in the RCA and proximal LAD with narrowing of 25-50%.  7.  She underwent FFR analysis with both lesions being in the normal FFR  range of greater than 0.80.  She has significantly reduced her tobacco and now smokes very intermittently.   I saw her in May 04, 2017 currently at that time it appeared that she was on both low-dose amlodipine in addition to diltiazem.  I discontinued amlodipine and further titrated diltiazem.  Over the past year she has done fairly well.  She had seen Mammie Russian, PA C in August 2019.  She had experienced occasional twinges of chest pain.  He had undergone cataract surgery.  I saw her in January 2020  in the office and  in April 2020 in a telemedicine visit.  At that time, she was tolerating olmesartan 20 mg with her diltiazem for blood pressure control.  There was concerns of GI issues and she is in need for a colonoscopy.  She had been evaluated by Dr. Benson Norway and this had been deferred with the COVID-19 pandemic.    When I saw her in September 2020 she denied any chest pain PND orthopnea.  Her blood pressure had been stable typically running in the 120s to 140s at home.  Recently, her sister died with cardiogenic shock and sepsis.  She has seen Dr. Verlin Fester for allergies.  During that evaluation, her blood pressure was elevated at 150/64.  She had been under increased stress particularly with her sister's death.  I recommended further titration of olmesartan to 30 mg she was continue to take diltiazem CD 300 mg daily.  I recommended that if her blood pressure consistently stayed above 096 systolically further titration to olmesartan 40 mg would be necessary.  I saw Judith Stevens in the office on April 14, 2019.  Over the last several months she had felt fairly well but for the past 6 to 8 weeks she has noticed more shortness of breath than she had experienced previously.  Approximately 3 weeks ago, she had an episode of chest tightness which resolved.  She denies any significant chest pain since that time and denies any nocturnal symptoms.  She has experienced shortness of breath with activity and has taken a terbutaline tablet which she had had and noted improvement.  She denies any PND orthopnea.  She denies any nocturnal chest tightness.  During her evaluation, her ECG showed sinus rhythm at 70 bpm with an isolated PVC and new inferior and anterolateral T wave inversion of 3 to 5 mm in leads II 3 and F, V4 through V6.  During that evaluation I discussed with her my concern for her change in ECG with new T wave inversion.  She told me she had not had an episode of chest pain for over 3 weeks.  However she  was noticing shortness of breath with less activity.  I discussed options including admission to the hospital on that day versus adding additional medication to her regimen.  At the time she preferred that I do her cardiac catheterization.  I added isosorbide 30 mg daily to her medical regimen as well as metoprolol tartrate 12.5 mg twice daily I recommended reinstitution of aspirin which she had discontinued.  She continues to be on atorvastatin 80 mg daily, olmesartan 30 mg for blood pressure.  She denied any nocturnal symptoms.  I recommended that she return to the office today April 17, 2019 for reassessment of the effects of her medical therapy and repeat ECG.  Over the weekend, she actually has felt significantly better and denies any chest tightness or shortness of breath.  However, her EKG in the office today  now shows sinus bradycardia at 51 bpm with more pronounced T wave abnormality of approximately 10 mm in V3 through V6 in addition to 4 mm inferiorly.  As result, admission to the hospital was recommended and she was admitted with plans to institute heparinization and as long as she is stable plan cardiac catheterization tomorrow or later today if symptoms change.  She underwent successful cardiac catheterization on April 18, 2019 by Dr. Herbie Baltimore.  She was found to have moderate mid LAD stenosis of 55%.  In some views this appeared greater.  FFR analysis was negative at 0.85.  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  Medical therapy was recommended.  She was seen in follow-up by Edd Fabian, MD in the office on April 28, 2019 and remained fairly stable.    I saw her in April 2021.  Over the previous 3 months she continued to do well. She specifically denied any episodes of chest pain, PND, orthopnea.  She has continued to be on diltiazem 300 mg daily at bedtime, isosorbide 30 mg daily in  addition to olmesartan currently at 30 mg as well as metoprolol tartrate 25 mg twice a day.  She was unaware of any tachypalpitations.  She is sleeping well.   She continues to be on atorvastatin 80 mg and Zetia 10 mg for aggressive lipid management.  GERD is controlled with pantoprazole.  Her ECG showed complete resolution of her prior markedly abnormal T wave abnormalities.  I discussed possible potential microvascular angina having benefit with ranolazine therapy which could be added in the future.  I further titrated olmesartan to 40 mg daily.  She was off tobacco since January 2021.  When I saw her  in November 2021  she continued to remain stable.  She was no longer smoking.  She denied chest pain.  Blood pressures were typically running in the 120-130 range at home.  She had stopped taking her aspirin on her own and I recommended reinstitution of 81 mg daily and if not daily then perhaps every other day.  With her she was being seen by Dr. Isaiah Serge for pulmonary evaluation.    I last saw her on October 21, 2020.  At home her blood pressure typically has been running in the 120- 130 range and rarely has gone into the 140s.  Her blood pressure yesterday at home was 117/57.  She has tried to reduce her sodium intake but still does get sodium on occasion.  She is currently drinking at least 5 to 6 cups of caffeinated beverage per day.  She is unaware of palpitations.  She denies recurrent anginal symptomatology.  She has not had recent laboratory.  She is scheduled for follow-up pulmonary evaluation in several weeks.  She was bradycardic with a pulse of 51 and was on diltiazem 3 mg daily in addition to metoprolol tartrate 25 mg twice a day day.  She continued to be on olmesartan 40 mg.  With blood pressure lability and known CAD I recommended slight titration of isosorbide from 30 to 45 mg.  She was on atorvastatin 80 and Zetia 10 mg for hyperlipidemia with most recent LDL at 68.  Since I last saw her, she was  evaluated by Edd Fabian in June 2023.  I last saw her on March 16, 2022. Her blood pressure has been stable.  She has COPD/asthma.  At times she experiences some shortness  of breath.  She continues to be on diltiazem 300 mg, isosorbide 45 mg, olmesartan 40 mg and metoprolol tartrate 25 mg twice a day.  She is on Zetia 10 mg and atorvastatin 80 mg for hyperlipidemia.  She is on albuterol, terbutaline and Advair for her lungs.  She takes pantoprazole for GERD and sertraline for depression.  She presents for evaluation.  Past Medical History:  Diagnosis Date   Anxiety    Aortic valve stenosis    Arthritis    Asthma    CAD (coronary artery disease)    single vessel   COPD (chronic obstructive pulmonary disease) (HCC)    GERD (gastroesophageal reflux disease)    Headache    History of stress test 10/2011   Resting images reveal a normal pattern of perfusion in all regions. The post stress myocardial perfusion images show a normal pattern of perfusion in all region. The post stress left ventricle is normal size. the rest left ventricle is normal size, there is no scintigraphic evidence of inductible myocardial ischemia.   Hx of echocardiogram 09/2009   EF >55% Normal Echo   Hyperlipidemia    Hypertension    Migraines    Pneumonia    hx of   Pulmonary emboli (HCC) 5/12   Shortness of breath dyspnea    exertion or eating something shes allergic too   Tobacco abuse     Past Surgical History:  Procedure Laterality Date   ADENOIDECTOMY     BACK SURGERY  2012   CARDIAC CATHETERIZATION  10/13/2007   60% narrowing twin LAD system   CORONARY PRESSURE/FFR STUDY N/A 04/18/2019   Procedure: INTRAVASCULAR PRESSURE WIRE/FFR STUDY;  Surgeon: Marykay Lex, MD;  Location: [facility] INVASIVE CV LAB;  Service: Cardiovascular;  Laterality: N/A;   DILATION AND CURETTAGE OF UTERUS     FOOT SURGERY  2002,2013   right   KNEE SURGERY  01/17/2007   right   LEFT HEART CATH AND CORONARY ANGIOGRAPHY N/A  04/18/2019   Procedure: LEFT HEART CATH AND CORONARY ANGIOGRAPHY;  Surgeon: Marykay Lex, MD;  Location: [facility] INVASIVE CV LAB;  Service: Cardiovascular;  Laterality: N/A;   SACROILIAC JOINT FUSION Right 03/28/2014   Procedure: SACROILIAC JOINT FUSION;  Surgeon: Emilee Hero, MD;  Location: [facility] OR;  Service: Orthopedics;  Laterality: Right;  Right sided sacroliac joint fusion   TONSILLECTOMY      Allergies  Allergen Reactions   Cefzil [Cefprozil] Shortness Of Breath   Gluten Meal Shortness Of Breath   Keflex [Cephalexin] Shortness Of Breath and Rash   Lactose Intolerance (Gi) Shortness Of Breath    ALL DAIRY PRODUCTS   Levaquin [Levofloxacin] Shortness Of Breath and Rash   Naproxen Shortness Of Breath    Shortness of breath Shortness of breath   Peanuts [Peanut Oil] Shortness Of Breath   Penicillins Shortness Of Breath   Percocet [Oxycodone-Acetaminophen] Shortness Of Breath   Prednisone Shortness Of Breath and Other (See Comments)    Rash   Red Dye Shortness Of Breath   Sulfa Antibiotics Shortness Of Breath   Sulfacetamide Sodium Shortness Of Breath   Tetracycline Hcl Shortness Of Breath   Tetracyclines & Related Shortness Of Breath   Wheat Shortness Of Breath   Tomato Other (See Comments)    SOB   Influenza Vaccines     Other reaction(s): Other (See Comments) Arm redness and swelling.   Amoxicillin-Pot Clavulanate Rash   Ibuprofen Rash   Methocarbamol Nausea And Vomiting   Moxifloxacin Rash  Penicillin G Rash   Sulfamethoxazole-Trimethoprim Rash   Tetracycline Rash   Tramadol Rash    Current Outpatient Medications  Medication Sig Dispense Refill   albuterol (PROVENTIL) (2.5 MG/3ML) 0.083% nebulizer solution Take 3 mLs (2.5 mg total) by nebulization every 6 (six) hours as needed for wheezing or shortness of breath. 75 mL 5   ALPRAZolam (XANAX) 1 MG tablet Take 1 mg by mouth 3 (three) times daily as needed for anxiety.      atorvastatin (LIPITOR) 80 MG tablet  TAKE 1 TABLET BY MOUTH EVERY DAY 90 tablet 3   bisoprolol (ZEBETA) 5 MG tablet TAKE 1/2 TABLET BY MOUTH DAILY 45 tablet 8   butalbital-acetaminophen-caffeine (FIORICET) 50-325-40 MG tablet Take 1 tablet by mouth every 4 (four) hours as needed for migraine.     Cholecalciferol (VITAMIN D3) 2000 UNITS TABS Take 2,000 Units by mouth daily.      diltiazem (CARDIZEM CD) 300 MG 24 hr capsule Take 1 capsule (300 mg total) by mouth at bedtime. 90 capsule 2   ezetimibe (ZETIA) 10 MG tablet TAKE 1 TABLET BY MOUTH EVERYDAY AT BEDTIME 90 tablet 3   fluticasone (FLONASE) 50 MCG/ACT nasal spray 2 sprays by Each Nare route daily.     Fluticasone-Salmeterol (ADVAIR) 250-50 MCG/DOSE AEPB Inhale 1 puff into the lungs 2 (two) times daily.     isosorbide mononitrate (IMDUR) 30 MG 24 hr tablet Take 1.5 tablets (45 mg total) by mouth daily. 135 tablet 3   olmesartan (BENICAR) 40 MG tablet Take 1 tablet (40 mg total) by mouth daily. 30 tablet 0   ondansetron (ZOFRAN-ODT) 8 MG disintegrating tablet Take 8 mg by mouth every 8 (eight) hours as needed for nausea or vomiting.     pantoprazole (PROTONIX) 40 MG tablet Take 40 mg by mouth 2 (two) times daily.      sertraline (ZOLOFT) 50 MG tablet Take 50 mg by mouth daily.     terbutaline (BRETHINE) 5 MG tablet Take 1 tablet (5 mg total) by mouth every 6 (six) hours as needed. 120 tablet 11   VENTOLIN HFA 108 (90 Base) MCG/ACT inhaler Inhale 2 puffs into the lungs every 6 (six) hours as needed for wheezing.   11   HYDROcodone-acetaminophen (NORCO/VICODIN) 5-325 MG tablet Take 1 tablet by mouth every 6 (six) hours as needed. (Patient not taking: Reported on 09/23/2022)     nitroGLYCERIN (NITROSTAT) 0.4 MG SL tablet Place 1 tablet (0.4 mg total) under the tongue every 5 (five) minutes as needed for chest pain. Needs appointment for future refills (Patient not taking: Reported on 09/23/2022) 25 tablet 0   ondansetron (ZOFRAN) 4 MG tablet Take 4 mg by mouth every 8 (eight) hours as  needed for nausea. (Patient not taking: Reported on 09/23/2022)     No current facility-administered medications for this visit.    Social History   Socioeconomic History   Marital status: Widowed    Spouse name: Not on file   Number of children: Not on file   Years of education: Not on file   Highest education level: Not on file  Occupational History   Not on file  Tobacco Use   Smoking status: Former    Packs/day: 1    Types: Cigarettes    Start date: 01/31/1959    Quit date: 04/15/2019    Years since quitting: 3.4   Smokeless tobacco: Former    Quit date: 01/29/2014  Vaping Use   Vaping Use: Never used  Substance and Sexual  Activity   Alcohol use: Yes    Alcohol/week: 4.0 standard drinks of alcohol    Types: 2 Glasses of wine, 2 Cans of beer per week   Drug use: No   Sexual activity: Not on file  Other Topics Concern   Not on file  Social History Narrative   Not on file   Social Determinants of Health   Financial Resource Strain: Not on file  Food Insecurity: Not on file  Transportation Needs: Not on file  Physical Activity: Not on file  Stress: Not on file  Social Connections: Not on file  Intimate Partner Violence: Not on file    Family History  Problem Relation Age of Onset   Heart attack Mother    Diabetes Mother    Asthma Mother    Heart attack Father    Diabetes Sister    Hyperlipidemia Sister    Allergic rhinitis Neg Hx    Angioedema Neg Hx    Eczema Neg Hx    Immunodeficiency Neg Hx    Urticaria Neg Hx    Socially, she is widowed. She has 2 children 5 grandchildren. She does not exercise. She quit smoking 2 years ago. resumed on 06/09/2024after her dog died.  She is committed to completely discontinue tobacco.   ROS General: Negative; No fevers, chills, or night sweats;  HEENT: Negative; No changes in vision or hearing, sinus congestion, difficulty swallowing Pulmonary: Positive for asthma Cardiovascular: Negative; No chest pain, presyncope,  syncope, palpitations GI: positive for GERD; No nausea, vomiting, diarrhea, or abdominal pain GU: Negative; No dysuria, hematuria, or difficulty voiding Musculoskeletal: She tolerated her hip surgery well. Hematologic/Oncology: Negative; no easy bruising, bleeding Endocrine: Negative; no heat/cold intolerance; no diabetes Neuro: Negative; no changes in balance, headaches Skin: Negative; No rashes or skin lesions Psychiatric: Negative; No behavioral problems, depression Sleep: Negative; No snoring, daytime sleepiness, hypersomnolence, bruxism, restless legs, hypnogognic hallucinations, no cataplexy Other comprehensive 14 point system review is negative.  Nutritionally she cannot have dairy products due to significant milk allergy.  PE BP 126/86   Pulse (!) 58   Ht 5\' 4"  (1.626 m)   Wt 140 lb (63.5 kg)   LMP  (LMP Unknown)   SpO2 95%   BMI 24.03 kg/m    Repeat blood pressure by me was 124/70  Wt Readings from Last 3 Encounters:  09/23/22 140 lb (63.5 kg)  08/25/22 140 lb 9.6 oz (63.8 kg)  03/16/22 135 lb 3.2 oz (61.3 kg)     Physical Exam BP 126/86   Pulse (!) 58   Ht 5\' 4"  (1.626 m)   Wt 140 lb (63.5 kg)   LMP  (LMP Unknown)   SpO2 95%   BMI 24.03 kg/m  General: Alert, oriented, no distress.  Skin: normal turgor, no rashes, warm and dry HEENT: Normocephalic, atraumatic. Pupils equal round and reactive to light; sclera anicteric; extraocular muscles intact; Fundi ** Nose without nasal septal hypertrophy Mouth/Parynx benign; Mallinpatti scale Neck: No JVD, no carotid bruits; normal carotid upstroke Lungs: clear to ausculatation and percussion; no wheezing or rales Chest wall: without tenderness to palpitation Heart: PMI not displaced, RRR, s1 s2 normal, 1/6 systolic murmur, no diastolic murmur, no rubs, gallops, thrills, or heaves Abdomen: soft, nontender; no hepatosplenomehaly, BS+; abdominal aorta nontender and not dilated by palpation. Back: no CVA  tenderness Pulses 2+ Musculoskeletal: full range of motion, normal strength, no joint deformities Extremities: no clubbing cyanosis or edema, Homan's sign negative  Neurologic: grossly nonfocal;  Cranial nerves grossly wnl Psychologic: Normal mood and affect    General: Alert, oriented, no distress.  Skin: normal turgor, no rashes, warm and dry HEENT: Normocephalic, atraumatic. Pupils equal round and reactive to light; sclera anicteric; extraocular muscles intact;  Nose without nasal septal hypertrophy Mouth/Parynx benign; Mallinpatti scale Neck: No JVD, no carotid bruits; normal carotid upstroke Lungs: Decreased breath sounds without active wheezing Chest wall: without tenderness to palpitation Heart: PMI not displaced, RRR, s1 s2 normal, 1/6 systolic murmur, no diastolic murmur, no rubs, gallops, thrills, or heaves Abdomen: soft, nontender; no hepatosplenomehaly, BS+; abdominal aorta nontender and not dilated by palpation. Back: no CVA tenderness Pulses 2+ Musculoskeletal: full range of motion, normal strength, no joint deformities Extremities: no clubbing cyanosis or edema, Homan's sign negative  Neurologic: grossly nonfocal; Cranial nerves grossly wnl Psychologic: Normal mood and affect       n  March 16, 2022 ECG (independently read by me):  Sinus bradycardia at 55, QS V1-3  October 21, 2020 ECG (independently read by me): Sinus bradycardia 51 bpm.  No ectopy.  Normal intervals.    November 2021 ECG (independently read by me): Normal sinus rhythm at 60 bpm.  Poor R wave progression V1 through V3.  Normal intervals.  No ectopy.  April 2021 ECG (independently read by me): Sinus bradycardia 51 bpm.  Marked improvement in prior T wave abnormality with minimal nonspecific ST change in lead III.     April 17, 2019 ECG (independently read by me): Sinus bradycardia 51 bpm.  LVH by voltage criteria.  More deeply inverted inferolateral T wave inversion 8 to 10 mm in the anterolateral  leads in 2 to 4 mm inferiorly  April 14, 2019 ECG (independently read by me): Normal sinus rhythm at 70 bpm.  New inferior and anterolateral T wave inversion.  PR interval 170 ms, QTc interval 444 ms; an isolated PVC  September 2020 ECG (independently read by me): Sinus bradycardia at 58 bpm.  Nonspecific ST changes.  Poor R wave progression V1 through V3  January 2020 ECG (independently read by me): Normal sinus rhythm at 61 bpm.  No ectopy.  Specific ST change in lead III.  February 2019 ECG (independently read by me): Normal sinus rhythm at 72 bpm.  Nonspecific ST changes inferiorly.  QTc interval 451 ms.  June 2018 ECG (independently read by me): Sinus bradycardia 57 bpm.  No significant ST-T changes.  Normal intervals.  January 2018 ECG (independently read by me): Normal sinus rhythm at 69 bpm.  No ectopy.  Normal intervals.  November 2016 ECG (independently read by me): Normal sinus rhythm at 60 bpm.  No significant ST-T changes.  Normal intervals.  November 2015 ECG (independently read by me): Normal sinus rhythm at 72 bpm.  Normal intervals.  No significant ST-T changes.  October 2014 ECG: Sinus rhythm with occasional isolated PVC. Heart rate 68 beats per minute. QTc interval 440 ms.  LAB:    Latest Ref Rng & Units 06/25/2021    8:51 AM 02/21/2020   11:01 AM 04/18/2019   12:18 AM  BMP  Glucose 70 - 99 mg/dL 960  454  098   BUN 8 - 27 mg/dL 9  12  15    Creatinine 0.57 - 1.00 mg/dL 1.19  1.47  8.29   BUN/Creat Ratio 12 - 28 15  17     Sodium 134 - 144 mmol/L 141  141  140   Potassium 3.5 - 5.2 mmol/L 4.6  4.7  3.8  Chloride 96 - 106 mmol/L 103  103  106   CO2 20 - 29 mmol/L 23  28  25    Calcium 8.7 - 10.3 mg/dL 9.3  9.1  8.6       Latest Ref Rng & Units 06/25/2021    8:51 AM 12/09/2018    9:31 AM 11/03/2017    8:35 AM  Hepatic Function  Total Protein 6.0 - 8.5 g/dL 6.4  6.7  6.1   Albumin 3.7 - 4.7 g/dL 5.0  4.9  4.5   AST 0 - 40 IU/L 23  21  16    ALT 0 - 32 IU/L 20   26  22    Alk Phosphatase 44 - 121 IU/L 68  63  67   Total Bilirubin 0.0 - 1.2 mg/dL 0.4  0.3  0.2       Latest Ref Rng & Units 06/25/2021    8:51 AM 01/23/2021   11:03 AM 02/21/2020   11:01 AM  CBC  WBC 3.4 - 10.8 x10E3/uL 3.3  7.8  5.0   Hemoglobin 11.1 - 15.9 g/dL 40.9  81.1  91.4   Hematocrit 34.0 - 46.6 % 39.0  37.9  35.5   Platelets 150 - 450 x10E3/uL 131  147.0  122    Lab Results  Component Value Date   MCV 92 06/25/2021   MCV 94.5 01/23/2021   MCV 93 02/21/2020   Lab Results  Component Value Date   TSH 1.380 06/25/2021   No results found for: "HGBA1C"   Lipid Panel     Component Value Date/Time   CHOL 171 06/25/2021 0851   TRIG 60 06/25/2021 0851   HDL 91 06/25/2021 0851   CHOLHDL 1.9 06/25/2021 0851   CHOLHDL 2.0 04/27/2016 0923   VLDL 10 04/27/2016 0923   LDLCALC 68 06/25/2021 [id number]    RADIOLOGY: No results found.  IMPRESSION:  1. 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  Unfortunately, she was under significant stress with the  death of her dog and resumed smoking in May 2018 continued to smoke intermittently but again quit smoking in March 2020.  Remotely she was found to have she has CAD with a 60% ostial LAD stenosis dating back to 2001 and which again was noted on her repeat catheterization by me in 2009.  We have been aggressively treating her lipids in attempt to induce plaque regression and she has tolerated Lipitor 80 mg in addition to Zetia 10 mg.  Laboratory on December 09, 2018 showed a total cholesterol 159, triglycerides 46, HDL 81, and LDL cholesterol at 68.  A previous coronary CT angiography in December 2018 did not demonstrate significant progression of disease and showed 25-50% stenoses in the LAD and RCA; FFR was negative.  I had seen her in  early 2020 and had instituted olmesartan due to continued blood pressure elevation.  She had experienced increased stress at the time of her last evaluation in 10-02-20due to her sister's death.  At that time her blood pressure was elevated and I further titrated olmesartan.  She resumed tobacco use.  When I had seen her in January 2021 she had experienced more episodes of shortness of breath with activity and she developed new inferolateral T wave inversion worrisome for ischemia.  Due  to progressive ECG changes she ultimately was hospitalized and catheterization revealed bifurcation narrowing in a twin like LAD vessel with narrowing of at least 55% or greater in the more inferior branch.  FFR analysis was negative.  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  She has since quit smoking in January.  With discontinuance of tobacco, I believe this is had significant benefit particularly with reference to endothelial dysfunction and potential nicotine mediated vasospasm.  At her last evaluation with me in July 2022 she was continuing to have blood pressure lability.   She was eating with salt but markedly less than previously.  She also has been having 5 to 6 cups of caffeinated beverage per day.  I have recommended reduction of both sodium intake and caffeine.  Presently, she is not having any angina.  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  On physical exam today she has significantly decreased breath sounds but no active wheezing.  She is bradycardic with a pulse of 55 and blood pressure today is stable.  I have recommended slight adjustment to her medication and instead of metoprolol I will switch her to low-dose bisoprolol 2.5 mg for 2 weeks and then titrate to 5 mg depending upon blood pressure and heart rate response.  This may have improved cardio selectivity.  She continues to be on diltiazem 300 mg daily in addition to olmesartan 40 mg and isosorbide 45 mg for blood pressure and CAD.  She is on  albuterol, terbutaline as needed, and Advair.  She continues to be on atorvastatin 80 mg for hyperlipidemia with target LDL less than 70.  She is on sertraline for depression and pantoprazole for GERD.  I am recommending that she undergo a follow-up echo Doppler study for reassessment of LV systolic and diastolic function.  She sees Mady Gemma, [place] for primary care.  Lipid studies in March 2023 showed an LDL of 68 and creatinine 0.62 with TSH 1.38.  I will see her in 6 -12 months for reevaluation or sooner as needed.    Lennette Bihari, MD, [facility]  09/23/2022 3:54 PM

## 2022-09-23 NOTE — Patient Instructions (Addendum)
Medication Instructions:  Pick up hydrochlorothiazide 12.5mg  from pharmacy.  *If you need a refill on your cardiac medications before your next appointment, please call your pharmacy*   Lab Work: Return for fasting labs  If you have labs (blood work) drawn today and your tests are completely normal, you will receive your results only by: MyChart Message (if you have MyChart) OR A paper copy in the mail If you have any lab test that is abnormal or we need to change your treatment, we will call you to review the results.   Testing/Procedures: none   Follow-Up: At [facility], you and your health needs are our priority.  As part of our continuing mission to provide you with exceptional heart care, we have created designated Provider Care Teams.  These Care Teams include your primary Cardiologist (physician) and Advanced Practice Providers (APPs -  Physician Assistants and Nurse Practitioners) who all work together to provide you with the care you need, when you need it.  We recommend signing up for the patient portal called "MyChart".  Sign up information is provided on this After Visit Summary.  MyChart is used to connect with patients for Virtual Visits (Telemedicine).  Patients are able to view lab/test results, encounter notes, upcoming appointments, etc.  Non-urgent messages can be sent to your provider as well.   To learn more about what you can do with MyChart, go to [email or web address].    Your next appointment:   6 month(s)  Provider:   Nicki Guadalajara, MD

## 2022-09-24 ENCOUNTER — Encounter: Payer: Self-pay | Admitting: Cardiovascular Disease

## 2022-09-24 ENCOUNTER — Other Ambulatory Visit: Payer: Self-pay

## 2022-09-24 ENCOUNTER — Telehealth: Payer: Self-pay | Admitting: Cardiovascular Disease

## 2022-09-24 MED ORDER — HYDROCHLOROTHIAZIDE 12.5 MG PO CAPS: 12.5000 mg | ORAL_CAPSULE | Freq: Every day | ORAL | 3 refills | Status: DC

## 2022-09-24 NOTE — Telephone Encounter (Signed)
Called patient and notified her that her medication was sent to her desired pharmacy. She stated that she told them to send it there from the beginning and not sure why there was a mix up. I told her that I will call the mail order and cancel it but she asked me not to cancel it because she don't know how long she will be on it. She thanked me for the phone call and I apologized for any inconvenience.

## 2022-09-24 NOTE — Telephone Encounter (Signed)
*  STAT* If patient is at the pharmacy, call can be transferred to refill team.   1. Which medications need to be refilled? (please list name of each medication and dose if known) hydrochlorothiazide (MICROZIDE) 12.5 MG capsule   2. Which pharmacy/location (including street and city if local pharmacy) is medication to be sent to? [id number] - [place], [place] - [address]  Phone: [phone]         3. Do they need a 30 day or 90 day supply? 90 day  Previous refill request sent to wrong pharmacy

## 2022-10-13 ENCOUNTER — Telehealth: Payer: Self-pay | Admitting: Cardiovascular Disease

## 2022-10-13 MED ORDER — HYDROCHLOROTHIAZIDE 12.5 MG PO CAPS: 12.5000 mg | ORAL_CAPSULE | Freq: Every day | ORAL | 3 refills | Status: DC

## 2022-10-13 NOTE — Telephone Encounter (Signed)
Pt c/o medication issue:  1. Name of Medication:   hydrochlorothiazide (MICROZIDE) 12.5 MG capsule    2. How are you currently taking this medication (dosage and times per day)? Take 1 capsule (12.5 mg total) by mouth daily.   3. Are you having a reaction (difficulty breathing--STAT)? No  4. What is your medication issue? Pt would like to know if she needs to keep taking the medication listed above. If so the pt would like to get the medication from [facility]. Please advise.

## 2022-10-13 NOTE — Telephone Encounter (Signed)
Call to patient regarding, She ask if to continue hydrochlorothiazide.  She immedietly states "your office messed up and sent to the wrong pharmacy and I had to pay a whole lot of money for the prescription".  She states it was to be sent to [facility].  She then states that they will not fill this at [facility] until we call them.  Attempt to call and on hold for 6 minutes.  Advised patient can no longer wait as have other calls.  Advised that RX sent.  She states she now found that it is due to an allergy for Sulfa.  She states she has a Physicist, medical.  Advised to bring letter and doctor can sign and fax, or have the pharmacy call us for information.  She states understanding

## 2022-10-22 ENCOUNTER — Other Ambulatory Visit: Payer: Self-pay | Admitting: Cardiovascular Disease

## 2022-10-23 NOTE — Telephone Encounter (Signed)
This encounter was created in error - please disregard.

## 2022-11-20 ENCOUNTER — Telehealth: Payer: Self-pay | Admitting: Cardiovascular Disease

## 2022-11-20 MED ORDER — DILTIAZEM HCL ER COATED BEADS 300 MG PO CP24: 300.0000 mg | ORAL_CAPSULE | Freq: Every day | ORAL | 3 refills | Status: DC

## 2022-11-20 NOTE — Telephone Encounter (Signed)
*  STAT* If patient is at the pharmacy, call can be transferred to refill team.   1. Which medications need to be refilled? (please list name of each medication and dose if known) diltiazem (CARDIZEM CD) 300 MG 24 hr capsule  2. Which pharmacy/location (including street and city if local pharmacy) is medication to be sent to? [facility] - [place], [place] - [address]  3. Do they need a 30 day or 90 day supply? 90 day

## 2022-12-01 ENCOUNTER — Encounter (HOSPITAL_COMMUNITY): Payer: Self-pay | Admitting: Internal Medicine

## 2022-12-01 ENCOUNTER — Emergency Department (HOSPITAL_COMMUNITY): Payer: Medicare Other

## 2022-12-01 ENCOUNTER — Inpatient Hospital Stay (HOSPITAL_COMMUNITY)
Admission: EM | Admit: 2022-12-01 | Discharge: 2022-12-07 | DRG: 481 | Disposition: A | Discharge status: Skilled Nursing Facility | Payer: Medicare Other | Attending: Internal Medicine | Admitting: Internal Medicine

## 2022-12-01 ENCOUNTER — Other Ambulatory Visit: Payer: Self-pay

## 2022-12-01 DIAGNOSIS — F419 Anxiety disorder, unspecified: Secondary | ICD-10-CM | POA: Diagnosis present

## 2022-12-01 DIAGNOSIS — Z888 Allergy status to other drugs, medicaments and biological substances status: Secondary | ICD-10-CM

## 2022-12-01 DIAGNOSIS — E871 Hypo-osmolality and hyponatremia: Secondary | ICD-10-CM | POA: Diagnosis not present

## 2022-12-01 DIAGNOSIS — Z880 Allergy status to penicillin: Secondary | ICD-10-CM

## 2022-12-01 DIAGNOSIS — Z981 Arthrodesis status: Secondary | ICD-10-CM

## 2022-12-01 DIAGNOSIS — D696 Thrombocytopenia, unspecified: Secondary | ICD-10-CM | POA: Diagnosis not present

## 2022-12-01 DIAGNOSIS — Z885 Allergy status to narcotic agent status: Secondary | ICD-10-CM | POA: Diagnosis not present

## 2022-12-01 DIAGNOSIS — Z83438 Family history of other disorder of lipoprotein metabolism and other lipidemia: Secondary | ICD-10-CM

## 2022-12-01 DIAGNOSIS — R609 Edema, unspecified: Secondary | ICD-10-CM | POA: Diagnosis not present

## 2022-12-01 DIAGNOSIS — Z9889 Other specified postprocedural states: Secondary | ICD-10-CM

## 2022-12-01 DIAGNOSIS — D62 Acute posthemorrhagic anemia: Secondary | ICD-10-CM | POA: Diagnosis not present

## 2022-12-01 DIAGNOSIS — N179 Acute kidney failure, unspecified: Secondary | ICD-10-CM | POA: Diagnosis not present

## 2022-12-01 DIAGNOSIS — S72001A Fracture of unspecified part of neck of right femur, initial encounter for closed fracture: Secondary | ICD-10-CM | POA: Diagnosis present

## 2022-12-01 DIAGNOSIS — Z87891 Personal history of nicotine dependence: Secondary | ICD-10-CM | POA: Diagnosis not present

## 2022-12-01 DIAGNOSIS — Z91010 Allergy to peanuts: Secondary | ICD-10-CM

## 2022-12-01 DIAGNOSIS — J4489 Other specified chronic obstructive pulmonary disease: Secondary | ICD-10-CM | POA: Diagnosis present

## 2022-12-01 DIAGNOSIS — Z8249 Family history of ischemic heart disease and other diseases of the circulatory system: Secondary | ICD-10-CM

## 2022-12-01 DIAGNOSIS — Z881 Allergy status to other antibiotic agents status: Secondary | ICD-10-CM | POA: Diagnosis not present

## 2022-12-01 DIAGNOSIS — Z91018 Allergy to other foods: Secondary | ICD-10-CM

## 2022-12-01 DIAGNOSIS — I2510 Atherosclerotic heart disease of native coronary artery without angina pectoris: Secondary | ICD-10-CM | POA: Diagnosis present

## 2022-12-01 DIAGNOSIS — S72141A Displaced intertrochanteric fracture of right femur, initial encounter for closed fracture: Principal | ICD-10-CM | POA: Diagnosis present

## 2022-12-01 DIAGNOSIS — I9589 Other hypotension: Secondary | ICD-10-CM | POA: Diagnosis not present

## 2022-12-01 DIAGNOSIS — W19XXXA Unspecified fall, initial encounter: Principal | ICD-10-CM

## 2022-12-01 DIAGNOSIS — Z7951 Long term (current) use of inhaled steroids: Secondary | ICD-10-CM | POA: Diagnosis not present

## 2022-12-01 DIAGNOSIS — Z886 Allergy status to analgesic agent status: Secondary | ICD-10-CM

## 2022-12-01 DIAGNOSIS — Z833 Family history of diabetes mellitus: Secondary | ICD-10-CM

## 2022-12-01 DIAGNOSIS — F05 Delirium due to known physiological condition: Secondary | ICD-10-CM | POA: Diagnosis not present

## 2022-12-01 DIAGNOSIS — Z887 Allergy status to serum and vaccine status: Secondary | ICD-10-CM

## 2022-12-01 DIAGNOSIS — I10 Essential (primary) hypertension: Secondary | ICD-10-CM | POA: Diagnosis present

## 2022-12-01 DIAGNOSIS — G43909 Migraine, unspecified, not intractable, without status migrainosus: Secondary | ICD-10-CM | POA: Diagnosis present

## 2022-12-01 DIAGNOSIS — Z8679 Personal history of other diseases of the circulatory system: Secondary | ICD-10-CM

## 2022-12-01 DIAGNOSIS — Z79899 Other long term (current) drug therapy: Secondary | ICD-10-CM

## 2022-12-01 DIAGNOSIS — Z882 Allergy status to sulfonamides status: Secondary | ICD-10-CM

## 2022-12-01 DIAGNOSIS — W010XXA Fall on same level from slipping, tripping and stumbling without subsequent striking against object, initial encounter: Secondary | ICD-10-CM | POA: Diagnosis present

## 2022-12-01 DIAGNOSIS — Z8701 Personal history of pneumonia (recurrent): Secondary | ICD-10-CM

## 2022-12-01 DIAGNOSIS — K219 Gastro-esophageal reflux disease without esophagitis: Secondary | ICD-10-CM | POA: Diagnosis present

## 2022-12-01 DIAGNOSIS — J449 Chronic obstructive pulmonary disease, unspecified: Secondary | ICD-10-CM | POA: Diagnosis not present

## 2022-12-01 DIAGNOSIS — Z91011 Allergy to milk products: Secondary | ICD-10-CM

## 2022-12-01 DIAGNOSIS — E785 Hyperlipidemia, unspecified: Secondary | ICD-10-CM | POA: Diagnosis present

## 2022-12-01 DIAGNOSIS — Z86711 Personal history of pulmonary embolism: Secondary | ICD-10-CM

## 2022-12-01 DIAGNOSIS — Y92009 Unspecified place in unspecified non-institutional (private) residence as the place of occurrence of the external cause: Secondary | ICD-10-CM

## 2022-12-01 DIAGNOSIS — F32A Depression, unspecified: Secondary | ICD-10-CM | POA: Diagnosis present

## 2022-12-01 DIAGNOSIS — Z9089 Acquired absence of other organs: Secondary | ICD-10-CM

## 2022-12-01 DIAGNOSIS — Z825 Family history of asthma and other chronic lower respiratory diseases: Secondary | ICD-10-CM

## 2022-12-01 LAB — CBC WITH DIFFERENTIAL/PLATELET
Abs Immature Granulocytes: 0.05 10*3/uL (ref 0.00–0.07)
Basophils Absolute: 0 10*3/uL (ref 0.0–0.1)
Basophils Relative: 0 %
Eosinophils Absolute: 0 10*3/uL (ref 0.0–0.5)
Eosinophils Relative: 0 %
HCT: 32.8 % — ABNORMAL LOW (ref 36.0–46.0)
Hemoglobin: 10.7 g/dL — ABNORMAL LOW (ref 12.0–15.0)
Immature Granulocytes: 1 %
Lymphocytes Relative: 7 %
Lymphs Abs: 0.6 10*3/uL — ABNORMAL LOW (ref 0.7–4.0)
MCH: 31 pg (ref 26.0–34.0)
MCHC: 32.6 g/dL (ref 30.0–36.0)
MCV: 95.1 fL (ref 80.0–100.0)
Monocytes Absolute: 0.2 10*3/uL (ref 0.1–1.0)
Monocytes Relative: 3 %
Neutro Abs: 7.8 10*3/uL — ABNORMAL HIGH (ref 1.7–7.7)
Neutrophils Relative %: 89 %
Platelets: 147 10*3/uL — ABNORMAL LOW (ref 150–400)
RBC: 3.45 MIL/uL — ABNORMAL LOW (ref 3.87–5.11)
RDW: 13.6 % (ref 11.5–15.5)
WBC: 8.7 10*3/uL (ref 4.0–10.5)
nRBC: 0 % (ref 0.0–0.2)

## 2022-12-01 LAB — BASIC METABOLIC PANEL
Anion gap: 9 (ref 5–15)
BUN: 20 mg/dL (ref 8–23)
CO2: 26 mmol/L (ref 22–32)
Calcium: 8.5 mg/dL — ABNORMAL LOW (ref 8.9–10.3)
Chloride: 101 mmol/L (ref 98–111)
Creatinine, Ser: 0.51 mg/dL (ref 0.44–1.00)
GFR, Estimated: >60 mL/min (ref >60)
Glucose, Bld: 135 mg/dL — ABNORMAL HIGH (ref 70–99)
Potassium: 3.8 mmol/L (ref 3.5–5.1)
Sodium: 136 mmol/L (ref 135–145)

## 2022-12-01 LAB — PROTIME-INR
INR: 1 (ref 0.8–1.2)
Prothrombin Time: 13.8 s (ref 11.4–15.2)

## 2022-12-01 LAB — ABO/RH: ABO/RH(D): A POS

## 2022-12-01 MED ORDER — FLUTICASONE PROPIONATE 50 MCG/ACT NA SUSP
1.0000 | Freq: Every day | NASAL | Status: DC
  Administered 2022-12-02 – 2022-12-07 (×6): 1 via NASAL
  Filled 2022-12-01: qty 16

## 2022-12-01 MED ORDER — ALBUTEROL SULFATE (2.5 MG/3ML) 0.083% IN NEBU: 2.5000 mg | INHALATION_SOLUTION | RESPIRATORY_TRACT | Status: DC | PRN

## 2022-12-01 MED ORDER — ONDANSETRON HCL 4 MG PO TABS: 4.0000 mg | ORAL_TABLET | Freq: Four times a day (QID) | ORAL | Status: DC | PRN

## 2022-12-01 MED ORDER — IRBESARTAN 150 MG PO TABS
300.0000 mg | ORAL_TABLET | Freq: Every day | ORAL | Status: DC
  Administered 2022-12-02: 300 mg via ORAL
  Filled 2022-12-01: qty 2

## 2022-12-01 MED ORDER — ENOXAPARIN SODIUM 40 MG/0.4ML IJ SOSY
40.0000 mg | PREFILLED_SYRINGE | INTRAMUSCULAR | Status: DC
  Administered 2022-12-01: 40 mg via SUBCUTANEOUS
  Filled 2022-12-01: qty 0.4

## 2022-12-01 MED ORDER — ONDANSETRON HCL 4 MG/2ML IJ SOLN
4.0000 mg | Freq: Four times a day (QID) | INTRAMUSCULAR | Status: DC | PRN
  Administered 2022-12-02 – 2022-12-03 (×2): 4 mg via INTRAVENOUS
  Filled 2022-12-01 (×2): qty 2

## 2022-12-01 MED ORDER — DILTIAZEM HCL ER COATED BEADS 300 MG PO CP24
300.0000 mg | ORAL_CAPSULE | Freq: Every day | ORAL | Status: DC
  Administered 2022-12-01 – 2022-12-03 (×2): 300 mg via ORAL
  Filled 2022-12-01 (×4): qty 1

## 2022-12-01 MED ORDER — FENTANYL CITRATE PF 50 MCG/ML IJ SOSY
50.0000 ug | PREFILLED_SYRINGE | INTRAMUSCULAR | Status: DC | PRN
  Administered 2022-12-01 – 2022-12-02 (×7): 50 ug via INTRAVENOUS
  Filled 2022-12-01 (×7): qty 1

## 2022-12-01 MED ORDER — HYDROMORPHONE HCL 1 MG/ML IJ SOLN
0.5000 mg | INTRAMUSCULAR | Status: DC | PRN
  Filled 2022-12-01: qty 1

## 2022-12-01 MED ORDER — ATORVASTATIN CALCIUM 40 MG PO TABS
80.0000 mg | ORAL_TABLET | Freq: Every day | ORAL | Status: DC
  Administered 2022-12-01 – 2022-12-06 (×6): 80 mg via ORAL
  Filled 2022-12-01 (×6): qty 2

## 2022-12-01 MED ORDER — CARMEX CLASSIC LIP BALM EX OINT
TOPICAL_OINTMENT | CUTANEOUS | Status: DC | PRN
  Filled 2022-12-01: qty 10

## 2022-12-01 MED ORDER — MOMETASONE FURO-FORMOTEROL FUM 200-5 MCG/ACT IN AERO
2.0000 | INHALATION_SPRAY | Freq: Two times a day (BID) | RESPIRATORY_TRACT | Status: DC
  Administered 2022-12-01 – 2022-12-05 (×7): 2 via RESPIRATORY_TRACT
  Filled 2022-12-01: qty 8.8

## 2022-12-01 MED ORDER — ALPRAZOLAM 0.5 MG PO TABS
1.0000 mg | ORAL_TABLET | Freq: Three times a day (TID) | ORAL | Status: DC | PRN
  Administered 2022-12-03 – 2022-12-07 (×9): 1 mg via ORAL
  Filled 2022-12-01 (×9): qty 2

## 2022-12-01 MED ORDER — BISOPROLOL FUMARATE 5 MG PO TABS
2.5000 mg | ORAL_TABLET | Freq: Every day | ORAL | Status: DC
  Administered 2022-12-01 – 2022-12-02 (×2): 2.5 mg via ORAL
  Filled 2022-12-01 (×2): qty 1

## 2022-12-01 MED ORDER — PANTOPRAZOLE SODIUM 40 MG PO TBEC
40.0000 mg | DELAYED_RELEASE_TABLET | Freq: Two times a day (BID) | ORAL | Status: DC
  Administered 2022-12-01 – 2022-12-07 (×12): 40 mg via ORAL
  Filled 2022-12-01 (×12): qty 1

## 2022-12-01 MED ORDER — SERTRALINE HCL 50 MG PO TABS
50.0000 mg | ORAL_TABLET | Freq: Every day | ORAL | Status: DC
  Administered 2022-12-02 – 2022-12-03 (×2): 50 mg via ORAL
  Filled 2022-12-01 (×2): qty 1

## 2022-12-01 MED ORDER — ISOSORBIDE MONONITRATE ER 30 MG PO TB24
45.0000 mg | ORAL_TABLET | Freq: Every day | ORAL | Status: DC
  Administered 2022-12-02 – 2022-12-03 (×2): 45 mg via ORAL
  Filled 2022-12-01 (×2): qty 2

## 2022-12-01 NOTE — ED Notes (Signed)
Called 5W about RN assignment for this pt

## 2022-12-01 NOTE — H&P (Signed)
History and Physical  Judith Stevens DGU:[id number] DOB: [birth year] DOA: 12/01/2022  PCP: Richmond Campbell., PA-C   Chief Complaint: Fall, right hip pain  HPI: Judith Stevens is a 82 y.o. female with medical history significant for single-vessel CAD, COPD on room air, former tobacco abuse, hyperlipidemia admitted to the hospital with right mildly displaced hip fracture after mechanical fall today.  Patient states she was bending down to clean up the mess in her kitchen, somehow lost her balance fell onto her right side.  Had immediate pain and inability to stand.  Denies losing consciousness, dizziness, or hitting her head.  Says that her lower extremity edema is significantly improved and under good control.  She has good functional status, able to ambulate without difficulty and climb a flight of stairs without chest pain, she does have shortness of breath due to her COPD.  Denies any recent illness, no vomiting, fevers, chest pain, nausea.  ED Course: She was brought to the emergency department by EMS, who gave her 100 mcg of fentanyl and she tolerated this well.  She has a plethora of allergies unfortunately states she is unable to tell me what other medications she can tolerate, but knows that some pain medication gives her shortness of breath.  Lab work was done in the emergency department, which is unremarkable other than some anemia.  X-ray as noted below shows right hip fracture.  Patient has already been seen by orthopedic surgery, who plans surgical repair in the morning.  Review of Systems: Please see HPI for pertinent positives and negatives. A complete 10 system review of systems are otherwise negative.  Past Medical History:  Diagnosis Date   Anxiety    Aortic valve stenosis    Arthritis    Asthma    CAD (coronary artery disease)    single vessel   COPD (chronic obstructive pulmonary disease) (HCC)    GERD (gastroesophageal reflux disease)    Headache    History  of stress test 10/2011   Resting images reveal a normal pattern of perfusion in all regions. The post stress myocardial perfusion images show a normal pattern of perfusion in all region. The post stress left ventricle is normal size. the rest left ventricle is normal size, there is no scintigraphic evidence of inductible myocardial ischemia.   Hx of echocardiogram 09/2009   EF >55% Normal Echo   Hyperlipidemia    Hypertension    Migraines    Pneumonia    hx of   Pulmonary emboli (HCC) 5/12   Shortness of breath dyspnea    exertion or eating something shes allergic too   Tobacco abuse    Past Surgical History:  Procedure Laterality Date   ADENOIDECTOMY     BACK SURGERY  2012   CARDIAC CATHETERIZATION  10/13/2007   60% narrowing twin LAD system   CORONARY PRESSURE/FFR STUDY N/A 04/18/2019   Procedure: INTRAVASCULAR PRESSURE WIRE/FFR STUDY;  Surgeon: Marykay Lex, MD;  Location: [facility] INVASIVE CV LAB;  Service: Cardiovascular;  Laterality: N/A;   DILATION AND CURETTAGE OF UTERUS     FOOT SURGERY  2002,2013   right   KNEE SURGERY  01/17/2007   right   LEFT HEART CATH AND CORONARY ANGIOGRAPHY N/A 04/18/2019   Procedure: LEFT HEART CATH AND CORONARY ANGIOGRAPHY;  Surgeon: Marykay Lex, MD;  Location: [facility] INVASIVE CV LAB;  Service: Cardiovascular;  Laterality: N/A;   SACROILIAC JOINT FUSION Right 03/28/2014   Procedure: SACROILIAC JOINT FUSION;  Surgeon: Loraine Leriche  Lattie Haw, MD;  Location: [facility] OR;  Service: Orthopedics;  Laterality: Right;  Right sided sacroliac joint fusion   TONSILLECTOMY      Social History:  reports that she quit smoking about 3 years ago. Her smoking use included cigarettes. She started smoking about 63 years ago. She has a 60.2 pack-year smoking history. She quit smokeless tobacco use about 8 years ago. She reports current alcohol use of about 4.0 standard drinks of alcohol per week. She reports that she does not use drugs.   Allergies  Allergen Reactions    Cefzil [Cefprozil] Shortness Of Breath   Gluten Meal Shortness Of Breath   Keflex [Cephalexin] Shortness Of Breath and Rash   Lactose Intolerance (Gi) Shortness Of Breath    ALL DAIRY PRODUCTS   Levaquin [Levofloxacin] Shortness Of Breath and Rash   Naproxen Shortness Of Breath    Shortness of breath Shortness of breath   Peanuts [Peanut Oil] Shortness Of Breath   Penicillins Shortness Of Breath   Percocet [Oxycodone-Acetaminophen] Shortness Of Breath   Prednisone Shortness Of Breath and Other (See Comments)    Rash   Red Dye #40 (Allura Red) Shortness Of Breath   Sulfa Antibiotics Shortness Of Breath   Sulfacetamide Sodium Shortness Of Breath   Tetracycline Hcl Shortness Of Breath   Tetracyclines & Related Shortness Of Breath   Wheat Shortness Of Breath   Tomato Other (See Comments)    SOB   Influenza Vaccines     Other reaction(s): Other (See Comments) Arm redness and swelling.   Amoxicillin-Pot Clavulanate Rash   Ibuprofen Rash   Methocarbamol Nausea And Vomiting   Moxifloxacin Rash   Penicillin G Rash   Sulfamethoxazole-Trimethoprim Rash   Tetracycline Rash   Tramadol Rash    Family History  Problem Relation Age of Onset   Heart attack Mother    Diabetes Mother    Asthma Mother    Heart attack Father    Diabetes Sister    Hyperlipidemia Sister    Allergic rhinitis Neg Hx    Angioedema Neg Hx    Eczema Neg Hx    Immunodeficiency Neg Hx    Urticaria Neg Hx      Prior to Admission medications   Medication Sig Start Date End Date Taking? Authorizing Provider  albuterol (PROVENTIL) (2.5 MG/3ML) 0.083% nebulizer solution Take 3 mLs (2.5 mg total) by nebulization every 6 (six) hours as needed for wheezing or shortness of breath. 03/21/21   Glenford Bayley, NP  ALPRAZolam Prudy Feeler) 1 MG tablet Take 1 mg by mouth 3 (three) times daily as needed for anxiety.  04/11/18   [provider]  atorvastatin (LIPITOR) 80 MG tablet TAKE 1 TABLET BY MOUTH EVERY DAY  01/26/22   Lennette Bihari, MD  bisoprolol (ZEBETA) 5 MG tablet TAKE 1/2 TABLET BY MOUTH DAILY 07/16/22   Lennette Bihari, MD  butalbital-acetaminophen-caffeine (FIORICET) [phone] MG tablet Take 1 tablet by mouth every 4 (four) hours as needed for migraine. 04/14/22   [provider]  Cholecalciferol (VITAMIN D3) 2000 UNITS TABS Take 2,000 Units by mouth daily.     [provider]  diltiazem (CARDIZEM CD) 300 MG 24 hr capsule Take 1 capsule (300 mg total) by mouth at bedtime. 11/20/22   Lennette Bihari, MD  ezetimibe (ZETIA) 10 MG tablet TAKE 1 TABLET BY MOUTH EVERYDAY AT BEDTIME 06/01/22   Lennette Bihari, MD  fluticasone [facility]) 50 MCG/ACT nasal spray 2 sprays by Each Nare route daily.  02/19/20   [provider]  Fluticasone-Salmeterol (ADVAIR) 250-50 MCG/DOSE AEPB Inhale 1 puff into the lungs 2 (two) times daily. 03/11/16   [provider]  hydrochlorothiazide (MICROZIDE) 12.5 MG capsule Take 1 capsule (12.5 mg total) by mouth daily. (ok to fill despite sulfa  allergy) 10/22/22   Lennette Bihari, MD  HYDROcodone-acetaminophen (NORCO/VICODIN) 5-325 MG tablet Take 1 tablet by mouth every 6 (six) hours as needed. Patient not taking: Reported on 09/23/2022 06/18/22   [provider]  isosorbide mononitrate (IMDUR) 30 MG 24 hr tablet Take 1.5 tablets (45 mg total) by mouth daily. 11/26/21   Lennette Bihari, MD  nitroGLYCERIN (NITROSTAT) 0.4 MG SL tablet Place 1 tablet (0.4 mg total) under the tongue every 5 (five) minutes as needed for chest pain. Needs appointment for future refills Patient not taking: Reported on 09/23/2022 06/27/21   Jodelle Gross, NP  olmesartan (BENICAR) 40 MG tablet Take 1 tablet (40 mg total) by mouth daily. 08/25/22   Azalee Course, PA  ondansetron (ZOFRAN) 4 MG tablet Take 4 mg by mouth every 8 (eight) hours as needed for nausea. Patient not taking: Reported on 09/23/2022    [provider]  ondansetron (ZOFRAN-ODT) 8 MG  disintegrating tablet Take 8 mg by mouth every 8 (eight) hours as needed for nausea or vomiting. 05/19/22   [provider]  pantoprazole (PROTONIX) 40 MG tablet Take 40 mg by mouth 2 (two) times daily.  09/20/17   [provider]  sertraline (ZOLOFT) 50 MG tablet Take 50 mg by mouth daily. 08/28/21   [provider]  terbutaline (BRETHINE) 5 MG tablet Take 1 tablet (5 mg total) by mouth every 6 (six) hours as needed. 03/03/21   Charlott Holler, MD  VENTOLIN HFA 108 [phone] Base) MCG/ACT inhaler Inhale 2 puffs into the lungs every 6 (six) hours as needed for wheezing.  12/20/15   [provider]    Physical Exam: BP (!) 143/65 (BP Location: Right Arm)   Pulse 69   Temp 98.1 F (36.7 C) (Oral)   Resp 18   Ht 5\' 4"  (1.626 m)   Wt 63.5 kg   LMP  (LMP Unknown)   SpO2 97%   BMI 24.03 kg/m   General:  Alert, oriented, calm, in no acute distress  Eyes: EOMI, clear conjuctivae, white sclerea Neck: supple, no masses, trachea mildline  Cardiovascular: RRR, no murmurs or rubs, no peripheral edema  Respiratory: clear to auscultation bilaterally, no wheezes, no crackles  Abdomen: soft, nontender, nondistended, normal bowel tones heard  Skin: dry, no rashes  Musculoskeletal: no joint effusions, no edema, right hip pain with any movement not further examined Psychiatric: appropriate affect, normal speech  Neurologic: extraocular muscles intact, clear speech, moving all extremities with intact sensorium         Labs on Admission:  Basic Metabolic Panel: Recent Labs  Lab 12/01/22 1156  NA 136  K 3.8  CL 101  CO2 26  GLUCOSE 135*  BUN 20  CREATININE 0.51  CALCIUM 8.5*   Liver Function Tests: No results for input(s): "AST", "ALT", "ALKPHOS", "BILITOT", "PROT", "ALBUMIN" in the last 168 hours. No results for input(s): "LIPASE", "AMYLASE" in the last 168 hours. No results for input(s): "AMMONIA" in the last 168 hours. CBC: Recent Labs  Lab 12/01/22 1156  WBC  8.7  NEUTROABS 7.8*  HGB 10.7*  HCT 32.8*  MCV 95.1  PLT 147*   Cardiac Enzymes: No results for input(s): "CKTOTAL", "CKMB", "  CKMBINDEX", "TROPONINI" in the last 168 hours.  BNP (last 3 results) No results for input(s): "BNP" in the last 8760 hours.  ProBNP (last 3 results) No results for input(s): "PROBNP" in the last 8760 hours.  CBG: No results for input(s): "GLUCAP" in the last 168 hours.  Radiological Exams on Admission: DG Knee 1-2 Views Right  Result Date: 12/01/2022 CLINICAL DATA:  Mechanical fall.  Acute right hip fracture.  Pain. EXAM: RIGHT KNEE - 1-2 VIEW COMPARISON:  None Available. FINDINGS: Mild medial compartment joint space narrowing. Moderate medial and lateral compartment chondrocalcinosis. Tiny joint effusion. Diffuse decreased bone mineralization. No acute fracture is seen. No dislocation. Mild atherosclerotic calcifications. IMPRESSION: 1. No acute fracture. 2. Mild medial compartment joint space narrowing. 3. Moderate medial and lateral compartment chondrocalcinosis. Electronically Signed   By: Neita Garnet M.D.   On: 12/01/2022 14:35   DG Chest Port 1 View  Result Date: 12/01/2022 CLINICAL DATA:  Preoperative.  Acute right hip fracture. EXAM: PORTABLE CHEST 1 VIEW COMPARISON:  Chest radiographs 01/23/2021 and 01/24/2016 FINDINGS: Cardiac silhouette and mediastinal contours within limits. Mild calcification within the aortic arch. The lungs are clear. No pleural effusion pneumothorax. Mild-to-moderate multilevel degenerative disc changes of the thoracic spine. IMPRESSION: No active disease. Electronically Signed   By: Neita Garnet M.D.   On: 12/01/2022 14:31   DG Hip Unilat With Pelvis 2-3 Views Right  Result Date: 12/01/2022 CLINICAL DATA:  Fall. Injured right hip. On floor for 3 hours. Some shortening noticed by EMS. EXAM: DG HIP (WITH OR WITHOUT PELVIS) 2-3V RIGHT COMPARISON:  None Available. FINDINGS: The technologist reports the superior aspect of the pelvis  was not imaged due to patient pain. There is diffuse decreased bone mineralization. There is an acute fracture of the right proximal femoral intertrochanteric region with approximately 7 mm medial displacement of the distal fracture component with respect to the proximal fracture component. There is up to approximately 12 mm medial displacement of the lesser trochanter. Mild varus angulation. Mild bilateral femoroacetabular joint space narrowing, subchondral sclerosis, peripheral osteophytosis. Prior right sacroiliac joint fusion with 3 transverse metallic bars. Partial visualization of lumbosacral posterior fusion hardware. IMPRESSION: Acute mildly displaced right proximal femoral intertrochanteric fracture. Electronically Signed   By: Neita Garnet M.D.   On: 12/01/2022 14:30    Assessment/Plan Judith Stevens is a 82 y.o. female with medical history significant for single-vessel CAD, COPD on room air, former tobacco abuse, hyperlipidemia admitted to the hospital with right nondisplaced hip fracture after mechanical fall today.    Right hip fracture-mildly displaced, after mechanical fall -Inpatient admission -Nonweightbearing -Pain control with Tylenol and IV fentanyl (this is the only narcotic that I can be confident she did not have a reaction to it) -Zofran for nausea -Regular diet, n.p.o. after midnight for surgical repair with orthopedic surgery in the morning  Single-vessel CAD-stable without any troublesome symptoms -Continue bisoprolol  Hypertension-continue home hydrochlorothiazide, Imdur, Cardizem, Benicar  Depression-Zoloft  GERD-Protonix  Anemia-denies any signs or symptoms of bleeding, slightly lower than her baseline -Check anemia panel  DVT prophylaxis: Lovenox     Code Status: Full Code  Consults called: Orthopedic surgery  Admission status: The appropriate patient status for this patient is INPATIENT. Inpatient status is judged to be reasonable and necessary in  order to provide the required intensity of service to ensure the patient's safety. The patient's presenting symptoms, physical exam findings, and initial radiographic and laboratory data in the context of their chronic comorbidities is felt to  place them at high risk for further clinical deterioration. Furthermore, it is not anticipated that the patient will be medically stable for discharge from the hospital within 2 midnights of admission.    I certify that at the point of admission it is my clinical judgment that the patient will require inpatient hospital care spanning beyond 2 midnights from the point of admission due to high intensity of service, high risk for further deterioration and high frequency of surveillance required  Time spent: 56 minutes  Shemekia Patane Sharlette Dense MD [facility] Pager [phone]  If 7PM-7AM, please contact night-coverage [email or web address] Password [email or web address]  12/01/2022, 2:52 PM

## 2022-12-01 NOTE — ED Triage Notes (Signed)
Pt BIBA from home. Pt had mech fall and injured R hip, on floor for 3x hours. Some shortening noticed by EMS.  Given 100 mcg fent  Aox4

## 2022-12-01 NOTE — Consult Note (Signed)
Reason for Consult:Right hip fx Referring Physician: Adalberto Cole Time called: 1155 Time at bedside: 1204   Judith Stevens is an 82 y.o. female.  HPI: Judith Stevens was at home and fell. She was trying to clean up some spilled coffee and lost her balance. She had immediate right hip pain and could not get up. She was brought to the ED where x-rays showed a right hip fx and orthopedic surgery was consulted. She lives at home and usually uses a cane to ambulate.  Past Medical History:  Diagnosis Date   Anxiety    Aortic valve stenosis    Arthritis    Asthma    CAD (coronary artery disease)    single vessel   COPD (chronic obstructive pulmonary disease) (HCC)    GERD (gastroesophageal reflux disease)    Headache    History of stress test 10/2011   Resting images reveal a normal pattern of perfusion in all regions. The post stress myocardial perfusion images show a normal pattern of perfusion in all region. The post stress left ventricle is normal size. the rest left ventricle is normal size, there is no scintigraphic evidence of inductible myocardial ischemia.   Hx of echocardiogram 09/2009   EF >55% Normal Echo   Hyperlipidemia    Hypertension    Migraines    Pneumonia    hx of   Pulmonary emboli (HCC) 5/12   Shortness of breath dyspnea    exertion or eating something shes allergic too   Tobacco abuse     Past Surgical History:  Procedure Laterality Date   ADENOIDECTOMY     BACK SURGERY  2012   CARDIAC CATHETERIZATION  10/13/2007   60% narrowing twin LAD system   CORONARY PRESSURE/FFR STUDY N/A 04/18/2019   Procedure: INTRAVASCULAR PRESSURE WIRE/FFR STUDY;  Surgeon: Marykay Lex, MD;  Location: [facility] INVASIVE CV LAB;  Service: Cardiovascular;  Laterality: N/A;   DILATION AND CURETTAGE OF UTERUS     FOOT SURGERY  2002,2013   right   KNEE SURGERY  01/17/2007   right   LEFT HEART CATH AND CORONARY ANGIOGRAPHY N/A 04/18/2019   Procedure: LEFT HEART CATH AND CORONARY  ANGIOGRAPHY;  Surgeon: Marykay Lex, MD;  Location: [facility] INVASIVE CV LAB;  Service: Cardiovascular;  Laterality: N/A;   SACROILIAC JOINT FUSION Right 03/28/2014   Procedure: SACROILIAC JOINT FUSION;  Surgeon: Emilee Hero, MD;  Location: [facility] OR;  Service: Orthopedics;  Laterality: Right;  Right sided sacroliac joint fusion   TONSILLECTOMY      Family History  Problem Relation Age of Onset   Heart attack Mother    Diabetes Mother    Asthma Mother    Heart attack Father    Diabetes Sister    Hyperlipidemia Sister    Allergic rhinitis Neg Hx    Angioedema Neg Hx    Eczema Neg Hx    Immunodeficiency Neg Hx    Urticaria Neg Hx     Social History:  reports that she quit smoking about 3 years ago. Her smoking use included cigarettes. She started smoking about 63 years ago. She has a 60.2 pack-year smoking history. She quit smokeless tobacco use about 8 years ago. She reports current alcohol use of about 4.0 standard drinks of alcohol per week. She reports that she does not use drugs.  Allergies:  Allergies  Allergen Reactions   Cefzil [Cefprozil] Shortness Of Breath   Gluten Meal Shortness Of Breath   Keflex [Cephalexin] Shortness Of Breath and  Rash   Lactose Intolerance (Gi) Shortness Of Breath    ALL DAIRY PRODUCTS   Levaquin [Levofloxacin] Shortness Of Breath and Rash   Naproxen Shortness Of Breath    Shortness of breath Shortness of breath   Peanuts [Peanut Oil] Shortness Of Breath   Penicillins Shortness Of Breath   Percocet [Oxycodone-Acetaminophen] Shortness Of Breath   Prednisone Shortness Of Breath and Other (See Comments)    Rash   Red Dye #40 (Allura Red) Shortness Of Breath   Sulfa Antibiotics Shortness Of Breath   Sulfacetamide Sodium Shortness Of Breath   Tetracycline Hcl Shortness Of Breath   Tetracyclines & Related Shortness Of Breath   Wheat Shortness Of Breath   Tomato Other (See Comments)    SOB   Influenza Vaccines     Other reaction(s): Other  (See Comments) Arm redness and swelling.   Amoxicillin-Pot Clavulanate Rash   Ibuprofen Rash   Methocarbamol Nausea And Vomiting   Moxifloxacin Rash   Penicillin G Rash   Sulfamethoxazole-Trimethoprim Rash   Tetracycline Rash   Tramadol Rash    Medications: I have reviewed the patient's current medications.  Results for orders placed or performed during the hospital encounter of 12/01/22 (from the past 48 hour(s))  Basic metabolic panel     Status: Abnormal   Collection Time: 12/01/22 11:56 AM  Result Value Ref Range   Sodium 136 135 - 145 mmol/L   Potassium 3.8 3.5 - 5.1 mmol/L   Chloride 101 98 - 111 mmol/L   CO2 26 22 - 32 mmol/L   Glucose, Bld 135 (H) 70 - 99 mg/dL    Comment: Glucose reference range applies only to samples taken after fasting for at least 8 hours.   BUN 20 8 - 23 mg/dL   Creatinine, Ser 4.69 0.44 - 1.00 mg/dL   Calcium 8.5 (L) 8.9 - 10.3 mg/dL   GFR, Estimated >62 >95 mL/min    Comment: (NOTE) Calculated using the CKD-EPI Creatinine Equation (2021)    Anion gap 9 5 - 15    Comment: Performed at [facility], [address], [place], [place] [address]  CBC with Differential     Status: Abnormal   Collection Time: 12/01/22 11:56 AM  Result Value Ref Range   WBC 8.7 4.0 - 10.5 K/uL   RBC 3.45 (L) 3.87 - 5.11 MIL/uL   Hemoglobin 10.7 (L) 12.0 - 15.0 g/dL   HCT 24.4 (L) 01.0 - 27.2 %   MCV 95.1 80.0 - 100.0 fL   MCH 31.0 26.0 - 34.0 pg   MCHC 32.6 30.0 - 36.0 g/dL   RDW 53.6 64.4 - 03.4 %   Platelets 147 (L) 150 - 400 K/uL   nRBC 0.0 0.0 - 0.2 %   Neutrophils Relative % 89 %   Neutro Abs 7.8 (H) 1.7 - 7.7 K/uL   Lymphocytes Relative 7 %   Lymphs Abs 0.6 (L) 0.7 - 4.0 K/uL   Monocytes Relative 3 %   Monocytes Absolute 0.2 0.1 - 1.0 K/uL   Eosinophils Relative 0 %   Eosinophils Absolute 0.0 0.0 - 0.5 K/uL   Basophils Relative 0 %   Basophils Absolute 0.0 0.0 - 0.1 K/uL   Immature Granulocytes 1 %   Abs Immature Granulocytes  0.05 0.00 - 0.07 K/uL    Comment: Performed at [facility], [address] [place], [place] [address]  Type and screen [facility]     Status: None (Preliminary result)  Collection Time: 12/01/22 11:56 AM  Result Value Ref Range   ABO/RH(D) PENDING    Antibody Screen PENDING    Sample Expiration      12/04/2022,2359 Performed at [facility], [address], [place], [place] [address]     No results found.  Review of Systems  HENT:  Negative for ear discharge, ear pain, hearing loss and tinnitus.   Eyes:  Negative for photophobia and pain.  Respiratory:  Negative for cough and shortness of breath.   Cardiovascular:  Negative for chest pain.  Gastrointestinal:  Negative for abdominal pain, nausea and vomiting.  Genitourinary:  Negative for dysuria, flank pain, frequency and urgency.  Musculoskeletal:  Positive for arthralgias (Right hip). Negative for back pain, myalgias and neck pain.  Neurological:  Negative for dizziness and headaches.  Hematological:  Does not bruise/bleed easily.  Psychiatric/Behavioral:  The patient is not nervous/anxious.    Blood pressure (!) 119/104, pulse (!) 57, temperature 98.1 F (36.7 C), temperature source Oral, resp. rate 17, height 5\' 4"  (1.626 m), weight 63.5 kg, SpO2 95%. Physical Exam Constitutional:      General: She is not in acute distress.    Appearance: She is well-developed. She is not diaphoretic.  HENT:     Head: Normocephalic and atraumatic.  Eyes:     General: No scleral icterus.       Right eye: No discharge.        Left eye: No discharge.     Conjunctiva/sclera: Conjunctivae normal.  Cardiovascular:     Rate and Rhythm: Normal rate and regular rhythm.  Pulmonary:     Effort: Pulmonary effort is normal. No respiratory distress.  Musculoskeletal:     Cervical back: Normal range of motion.     Comments: RLE No traumatic wounds, ecchymosis, or rash  Severe TTP  hip  No knee or ankle effusion  Knee stable to varus/ valgus and anterior/posterior stress  Sens DPN, SPN, TN intact  Motor EHL, ext, flex, evers 5/5  DP 1+, PT 1+, No significant edema  Skin:    General: Skin is warm and dry.  Neurological:     Mental Status: She is alert.  Psychiatric:        Mood and Affect: Mood normal.        Behavior: Behavior normal.     Assessment/Plan: Right hip fx -- Plan IMN tomorrow with Dr. Blanchie Dessert. Please keep NPO after MN.    Freeman Caldron, PA-C Orthopedic Surgery [phone] 12/01/2022, 12:38 PM

## 2022-12-01 NOTE — ED Notes (Signed)
ED TO INPATIENT HANDOFF REPORT  ED Nurse Name and Phone #: Sophy Mesler K Gwendolyn Nishi Paramedic   S Name/Age/Gender Judith Stevens 82 y.o. female Room/Bed: WA20/WA20  Code Status   Code Status: Full Code  Home/SNF/Other   Patient oriented to: self, place, time, and situation Is this baseline? Yes   Triage Complete: Triage complete  Chief Complaint Closed right hip fracture (HCC) [S72.001A]  Triage Note Pt BIBA from home. Pt had mech fall and injured R hip, on floor for 3x hours. Some shortening noticed by EMS.  Given 100 mcg fent  Aox4    Allergies Allergies  Allergen Reactions   Cefzil [Cefprozil] Shortness Of Breath   Clarithromycin Shortness Of Breath    SOB   Egg Solids, Whole Shortness Of Breath   Gluten Meal Shortness Of Breath   Keflex [Cephalexin] Shortness Of Breath and Rash   Lactose Intolerance (Gi) Shortness Of Breath    ALL DAIRY PRODUCTS   Levaquin [Levofloxacin] Shortness Of Breath and Rash   Naproxen Shortness Of Breath    Shortness of breath Shortness of breath   Peanuts [Peanut Oil] Shortness Of Breath   Penicillins Shortness Of Breath   Percocet [Oxycodone-Acetaminophen] Shortness Of Breath   Prednisone Shortness Of Breath and Other (See Comments)    Rash   Red Dye #40 (Allura Red) Shortness Of Breath   Sulfa Antibiotics Shortness Of Breath   Sulfacetamide Sodium Shortness Of Breath   Tetracycline Hcl Shortness Of Breath   Tetracyclines & Related Shortness Of Breath   Wheat Shortness Of Breath   Influenza Vaccines     Other reaction(s): Other (See Comments) Arm redness and swelling.   Amoxicillin-Pot Clavulanate Rash   Ibuprofen Rash   Methocarbamol Nausea And Vomiting   Moxifloxacin Rash   Penicillin G Rash   Sulfamethoxazole-Trimethoprim Rash   Tetracycline Rash   Tramadol Rash    Level of Care/Admitting Diagnosis ED Disposition     ED Disposition  Admit   Condition  --   Comment  Hospital Area: [facility] [[id number]]  Level of Care: Med-Surg [16]  May admit patient to [facility] or [facility] if equivalent level of care is available:: Yes  Covid Evaluation: Asymptomatic - no recent exposure (last 10 days) testing not required  Diagnosis: Closed right hip fracture [facility]) [[id number]]  Admitting Physician: Maryln Gottron [[id number]]  Attending Physician: Olexa.Dam, MIR Jaxson.Roy [[id number]]  Certification:: I certify this patient will need inpatient services for at least 2 midnights  Expected Medical Readiness: 12/03/2022          B Medical/Surgery History Past Medical History:  Diagnosis Date   Anxiety    Aortic valve stenosis    Arthritis    Asthma    CAD (coronary artery disease)    single vessel   COPD (chronic obstructive pulmonary disease) (HCC)    GERD (gastroesophageal reflux disease)    Headache    History of stress test 10/2011   Resting images reveal a normal pattern of perfusion in all regions. The post stress myocardial perfusion images show a normal pattern of perfusion in all region. The post stress left ventricle is normal size. the rest left ventricle is normal size, there is no scintigraphic evidence of inductible myocardial ischemia.   Hx of echocardiogram 09/2009   EF >55% Normal Echo   Hyperlipidemia    Hypertension    Migraines    Pneumonia    hx of   Pulmonary emboli (HCC) 5/12  Shortness of breath dyspnea    exertion or eating something shes allergic too   Tobacco abuse    Past Surgical History:  Procedure Laterality Date   ADENOIDECTOMY     BACK SURGERY  2012   CARDIAC CATHETERIZATION  10/13/2007   60% narrowing twin LAD system   CORONARY PRESSURE/FFR STUDY N/A 04/18/2019   Procedure: INTRAVASCULAR PRESSURE WIRE/FFR STUDY;  Surgeon: Marykay Lex, MD;  Location: [facility] INVASIVE CV LAB;  Service: Cardiovascular;  Laterality: N/A;   DILATION AND CURETTAGE OF UTERUS     FOOT SURGERY  2002,2013   right   KNEE SURGERY  01/17/2007   right   LEFT HEART CATH  AND CORONARY ANGIOGRAPHY N/A 04/18/2019   Procedure: LEFT HEART CATH AND CORONARY ANGIOGRAPHY;  Surgeon: Marykay Lex, MD;  Location: [facility] INVASIVE CV LAB;  Service: Cardiovascular;  Laterality: N/A;   SACROILIAC JOINT FUSION Right 03/28/2014   Procedure: SACROILIAC JOINT FUSION;  Surgeon: Emilee Hero, MD;  Location: [facility] OR;  Service: Orthopedics;  Laterality: Right;  Right sided sacroliac joint fusion   TONSILLECTOMY       A IV Location/Drains/Wounds Patient Lines/Drains/Airways Status     Active Line/Drains/Airways     Name Placement date Placement time Site Days   Peripheral IV 12/01/22 20 G Anterior;Left;Proximal Forearm 12/01/22  1117  Forearm  less than 1            Intake/Output Last 24 hours No intake or output data in the 24 hours ending 12/01/22 1636  Labs/Imaging Results for orders placed or performed during the hospital encounter of 12/01/22 (from the past 48 hour(s))  Basic metabolic panel     Status: Abnormal   Collection Time: 12/01/22 11:56 AM  Result Value Ref Range   Sodium 136 135 - 145 mmol/L   Potassium 3.8 3.5 - 5.1 mmol/L   Chloride 101 98 - 111 mmol/L   CO2 26 22 - 32 mmol/L   Glucose, Bld 135 (H) 70 - 99 mg/dL    Comment: Glucose reference range applies only to samples taken after fasting for at least 8 hours.   BUN 20 8 - 23 mg/dL   Creatinine, Ser 8.65 0.44 - 1.00 mg/dL   Calcium 8.5 (L) 8.9 - 10.3 mg/dL   GFR, Estimated >78 >46 mL/min    Comment: (NOTE) Calculated using the CKD-EPI Creatinine Equation (2021)    Anion gap 9 5 - 15    Comment: Performed at [facility], [address], [place], [place] [address]  CBC with Differential     Status: Abnormal   Collection Time: 12/01/22 11:56 AM  Result Value Ref Range   WBC 8.7 4.0 - 10.5 K/uL   RBC 3.45 (L) 3.87 - 5.11 MIL/uL   Hemoglobin 10.7 (L) 12.0 - 15.0 g/dL   HCT 28.4 (L) 13.2 - 44.0 %   MCV 95.1 80.0 - 100.0 fL   MCH 31.0 26.0 - 34.0 pg   MCHC 32.6 30.0  - 36.0 g/dL   RDW 10.2 72.5 - 36.6 %   Platelets 147 (L) 150 - 400 K/uL   nRBC 0.0 0.0 - 0.2 %   Neutrophils Relative % 89 %   Neutro Abs 7.8 (H) 1.7 - 7.7 K/uL   Lymphocytes Relative 7 %   Lymphs Abs 0.6 (L) 0.7 - 4.0 K/uL   Monocytes Relative 3 %   Monocytes Absolute 0.2 0.1 - 1.0 K/uL   Eosinophils Relative 0 %   Eosinophils Absolute 0.0  0.0 - 0.5 K/uL   Basophils Relative 0 %   Basophils Absolute 0.0 0.0 - 0.1 K/uL   Immature Granulocytes 1 %   Abs Immature Granulocytes 0.05 0.00 - 0.07 K/uL    Comment: Performed at [facility], [address] [place], [place] [address]  Type and screen [facility]     Status: None   Collection Time: 12/01/22 11:56 AM  Result Value Ref Range   ABO/RH(D) A POS    Antibody Screen NEG    Sample Expiration      12/04/2022,2359 Performed at [facility], [address] [place], [place] [address]   Protime-INR     Status: None   Collection Time: 12/01/22 12:32 PM  Result Value Ref Range   Prothrombin Time 13.8 11.4 - 15.2 seconds   INR 1.0 0.8 - 1.2    Comment: (NOTE) INR goal varies based on device and disease states. Performed at [facility], [address], [place], [place] [address]    DG Knee 1-2 Views Right  Result Date: 12/01/2022 CLINICAL DATA:  Mechanical fall.  Acute right hip fracture.  Pain. EXAM: RIGHT KNEE - 1-2 VIEW COMPARISON:  None Available. FINDINGS: Mild medial compartment joint space narrowing. Moderate medial and lateral compartment chondrocalcinosis. Tiny joint effusion. Diffuse decreased bone mineralization. No acute fracture is seen. No dislocation. Mild atherosclerotic calcifications. IMPRESSION: 1. No acute fracture. 2. Mild medial compartment joint space narrowing. 3. Moderate medial and lateral compartment chondrocalcinosis. Electronically Signed   By: Neita Garnet M.D.   On: 12/01/2022 14:35   DG Chest Port 1 View  Result Date:  12/01/2022 CLINICAL DATA:  Preoperative.  Acute right hip fracture. EXAM: PORTABLE CHEST 1 VIEW COMPARISON:  Chest radiographs 01/23/2021 and 01/24/2016 FINDINGS: Cardiac silhouette and mediastinal contours within limits. Mild calcification within the aortic arch. The lungs are clear. No pleural effusion pneumothorax. Mild-to-moderate multilevel degenerative disc changes of the thoracic spine. IMPRESSION: No active disease. Electronically Signed   By: Neita Garnet M.D.   On: 12/01/2022 14:31   DG Hip Unilat With Pelvis 2-3 Views Right  Result Date: 12/01/2022 CLINICAL DATA:  Fall. Injured right hip. On floor for 3 hours. Some shortening noticed by EMS. EXAM: DG HIP (WITH OR WITHOUT PELVIS) 2-3V RIGHT COMPARISON:  None Available. FINDINGS: The technologist reports the superior aspect of the pelvis was not imaged due to patient pain. There is diffuse decreased bone mineralization. There is an acute fracture of the right proximal femoral intertrochanteric region with approximately 7 mm medial displacement of the distal fracture component with respect to the proximal fracture component. There is up to approximately 12 mm medial displacement of the lesser trochanter. Mild varus angulation. Mild bilateral femoroacetabular joint space narrowing, subchondral sclerosis, peripheral osteophytosis. Prior right sacroiliac joint fusion with 3 transverse metallic bars. Partial visualization of lumbosacral posterior fusion hardware. IMPRESSION: Acute mildly displaced right proximal femoral intertrochanteric fracture. Electronically Signed   By: Neita Garnet M.D.   On: 12/01/2022 14:30    Pending Labs Unresulted Labs (From admission, onward)     Start     Ordered   12/01/22 1500  ABO/Rh  Once,   R        12/01/22 1500   Signed and Held  Basic metabolic panel  Tomorrow morning,   R        Signed and Held   Signed and Held  CBC  Tomorrow morning,   R  Signed and Held            Vitals/Pain Today's Vitals    12/01/22 1135 12/01/22 1447 12/01/22 1500 12/01/22 1557  BP:  (!) 143/65 (!) 155/69   Pulse:  69 71   Resp:  18 15   Temp:   98.6 F (37 C)   TempSrc:   Oral   SpO2:  97% 98%   Weight: 63.5 kg     Height: 5\' 4"  (1.626 m)     PainSc: 10-Worst pain ever   10-Worst pain ever    Isolation Precautions No active isolations  Medications Medications  fentaNYL (SUBLIMAZE) injection 50 mcg (50 mcg Intravenous Given 12/01/22 1544)  lip balm (CARMEX) ointment ( Topical Given 12/01/22 1630)    Mobility Pt has Fx Femur at hip joint     Focused Assessments Ortho pt   R Recommendations: See Admitting Provider Note  Report given to:   Additional Notes:  Dr Jeraldine Loots states pt can eat and drink whatever she wants at this time

## 2022-12-01 NOTE — ED Provider Notes (Signed)
[place] EMERGENCY DEPARTMENT AT [facility] Provider Note   CSN: [id number] Arrival date & time: 12/01/22  1120     History  Chief Complaint  Patient presents with   Fall   Hip Pain    Judith Stevens is a 82 y.o. female.  HPI Patient presents after a fall, being found on the ground 3 hours after it occurred.  She recalls falling, but was in too much pain to move.  She is eventually joined by her daughter who assists with the history. She arrives via EMS.  Per EMS no hemodynamic instability in transport.    Home Medications Prior to Admission medications   Medication Sig Start Date End Date Taking? Authorizing Provider  ALPRAZolam Prudy Feeler) 1 MG tablet Take 1 mg by mouth 3 (three) times daily as needed for anxiety.  04/11/18  Yes [provider]  atorvastatin (LIPITOR) 80 MG tablet TAKE 1 TABLET BY MOUTH EVERY DAY 01/26/22  Yes Lennette Bihari, MD  bisoprolol (ZEBETA) 5 MG tablet TAKE 1/2 TABLET BY MOUTH DAILY Patient taking differently: Take 5 mg by mouth daily. Dr. Riley Churches Patient to 1 daily . 07/16/22  Yes Lennette Bihari, MD  Cholecalciferol (VITAMIN D3) 2000 UNITS TABS Take 2,000 Units by mouth daily.    Yes [provider]  diltiazem (CARDIZEM CD) 300 MG 24 hr capsule Take 1 capsule (300 mg total) by mouth at bedtime. 11/20/22  Yes Lennette Bihari, MD  escitalopram (LEXAPRO) 10 MG tablet Take 10 mg by mouth daily.   Yes [provider]  ezetimibe (ZETIA) 10 MG tablet TAKE 1 TABLET BY MOUTH EVERYDAY AT BEDTIME 06/01/22  Yes Lennette Bihari, MD  fluticasone [facility]) 50 MCG/ACT nasal spray 2 sprays by Each Nare route daily. 02/19/20  Yes [provider]  Fluticasone-Salmeterol (ADVAIR) 250-50 MCG/DOSE AEPB Inhale 1 puff into the lungs 2 (two) times daily. 03/11/16  Yes [provider]  hydrochlorothiazide (MICROZIDE) 12.5 MG capsule Take 1 capsule (12.5 mg total) by mouth daily. (ok to fill despite sulfa  allergy)  10/22/22  Yes Lennette Bihari, MD  isosorbide mononitrate (IMDUR) 30 MG 24 hr tablet Take 1.5 tablets (45 mg total) by mouth daily. 11/26/21  Yes Lennette Bihari, MD  nitroGLYCERIN (NITROSTAT) 0.4 MG SL tablet Place 1 tablet (0.4 mg total) under the tongue every 5 (five) minutes as needed for chest pain. Needs appointment for future refills 06/27/21  Yes Jodelle Gross, NP  olmesartan (BENICAR) 40 MG tablet Take 1 tablet (40 mg total) by mouth daily. 08/25/22  Yes Azalee Course, PA  ondansetron (ZOFRAN-ODT) 8 MG disintegrating tablet Take 8 mg by mouth every 8 (eight) hours as needed for nausea or vomiting. 05/19/22  Yes [provider]  pantoprazole (PROTONIX) 40 MG tablet Take 40 mg by mouth 2 (two) times daily.  09/20/17  Yes [provider]  terbutaline (BRETHINE) 5 MG tablet Take 1 tablet (5 mg total) by mouth every 6 (six) hours as needed. 03/03/21  Yes Charlott Holler, MD  VENTOLIN HFA 108 [phone] Base) MCG/ACT inhaler Inhale 2 puffs into the lungs every 6 (six) hours as needed for wheezing.  12/20/15  Yes [provider]      Allergies    Cefzil [cefprozil]; Clarithromycin; Egg solids, whole; Gluten meal; Keflex [cephalexin]; Lactose intolerance (gi); Levaquin [levofloxacin]; Naproxen; Peanuts [peanut oil]; Penicillins; Percocet [oxycodone-acetaminophen]; Prednisone; Red dye #40 (allura red); Sulfa antibiotics; Sulfacetamide sodium; Tetracycline hcl; Tetracyclines & related; Wheat; Influenza  vaccines; Amoxicillin-pot clavulanate; Ibuprofen; Methocarbamol; Moxifloxacin; Penicillin g; Sulfamethoxazole-trimethoprim; Tetracycline; and Tramadol    Review of Systems   Review of Systems  All other systems reviewed and are negative.   Physical Exam Updated Vital Signs BP (!) 155/69   Pulse 71   Temp 98.6 F (37 C) (Oral)   Resp 15   Ht 5\' 4"  (1.626 m)   Wt 63.5 kg   LMP  (LMP Unknown)   SpO2 98%   BMI 24.03 kg/m  Physical Exam Vitals and nursing note reviewed.   Constitutional:      General: She is not in acute distress.    Appearance: She is well-developed.  HENT:     Head: Normocephalic and atraumatic.  Eyes:     Conjunctiva/sclera: Conjunctivae normal.  Cardiovascular:     Rate and Rhythm: Normal rate and regular rhythm.  Pulmonary:     Effort: Pulmonary effort is normal. No respiratory distress.     Breath sounds: No stridor.  Abdominal:     General: There is no distension.  Musculoskeletal:     Comments: Shortening and external rotation right hip.  Right knee unremarkable, ankle unremarkable.  Skin:    General: Skin is warm and dry.  Neurological:     Mental Status: She is alert and oriented to person, place, and time.     Cranial Nerves: No cranial nerve deficit.  Psychiatric:        Mood and Affect: Mood normal.     ED Results / Procedures / Treatments   Labs (all labs ordered are listed, but only abnormal results are displayed) Labs Reviewed  BASIC METABOLIC PANEL - Abnormal; Notable for the following components:      Result Value   Glucose, Bld 135 (*)    Calcium 8.5 (*)    All other components within normal limits  CBC WITH DIFFERENTIAL/PLATELET - Abnormal; Notable for the following components:   RBC 3.45 (*)    Hemoglobin 10.7 (*)    HCT 32.8 (*)    Platelets 147 (*)    Neutro Abs 7.8 (*)    Lymphs Abs 0.6 (*)    All other components within normal limits  PROTIME-INR  TYPE AND SCREEN  ABO/RH    EKG EKG Interpretation Date/Time:  Tuesday December 01 2022 12:02:12 EDT Ventricular Rate:  64 PR Interval:  177 QRS Duration:  93 QT Interval:  446 QTC Calculation: 461 R Axis:   64  Text Interpretation: Sinus rhythm Consider anterior infarct Confirmed by Gerhard Munch [phone]) on 12/01/2022 2:25:42 PM  Radiology DG Knee 1-2 Views Right  Result Date: 12/01/2022 CLINICAL DATA:  Mechanical fall.  Acute right hip fracture.  Pain. EXAM: RIGHT KNEE - 1-2 VIEW COMPARISON:  None Available. FINDINGS: Mild medial  compartment joint space narrowing. Moderate medial and lateral compartment chondrocalcinosis. Tiny joint effusion. Diffuse decreased bone mineralization. No acute fracture is seen. No dislocation. Mild atherosclerotic calcifications. IMPRESSION: 1. No acute fracture. 2. Mild medial compartment joint space narrowing. 3. Moderate medial and lateral compartment chondrocalcinosis. Electronically Signed   By: Neita Garnet M.D.   On: 12/01/2022 14:35   DG Chest Port 1 View  Result Date: 12/01/2022 CLINICAL DATA:  Preoperative.  Acute right hip fracture. EXAM: PORTABLE CHEST 1 VIEW COMPARISON:  Chest radiographs 01/23/2021 and 01/24/2016 FINDINGS: Cardiac silhouette and mediastinal contours within limits. Mild calcification within the aortic arch. The lungs are clear. No pleural effusion pneumothorax. Mild-to-moderate multilevel degenerative disc changes of the thoracic spine. IMPRESSION: No active  disease. Electronically Signed   By: Neita Garnet M.D.   On: 12/01/2022 14:31   DG Hip Unilat With Pelvis 2-3 Views Right  Result Date: 12/01/2022 CLINICAL DATA:  Fall. Injured right hip. On floor for 3 hours. Some shortening noticed by EMS. EXAM: DG HIP (WITH OR WITHOUT PELVIS) 2-3V RIGHT COMPARISON:  None Available. FINDINGS: The technologist reports the superior aspect of the pelvis was not imaged due to patient pain. There is diffuse decreased bone mineralization. There is an acute fracture of the right proximal femoral intertrochanteric region with approximately 7 mm medial displacement of the distal fracture component with respect to the proximal fracture component. There is up to approximately 12 mm medial displacement of the lesser trochanter. Mild varus angulation. Mild bilateral femoroacetabular joint space narrowing, subchondral sclerosis, peripheral osteophytosis. Prior right sacroiliac joint fusion with 3 transverse metallic bars. Partial visualization of lumbosacral posterior fusion hardware. IMPRESSION:  Acute mildly displaced right proximal femoral intertrochanteric fracture. Electronically Signed   By: Neita Garnet M.D.   On: 12/01/2022 14:30    Procedures Procedures    Medications Ordered in ED Medications  fentaNYL (SUBLIMAZE) injection 50 mcg (50 mcg Intravenous Given 12/01/22 1544)  lip balm (CARMEX) ointment (has no administration in time range)    ED Course/ Medical Decision Making/ A&P                                 Medical Decision Making Early female presents from home after a fall with deformity of the right hip.  Concern for fracture versus dislocation.  X-ray performed, analgesics, monitoring started. Cardiac 70 sinus normal Pulse ox 100% room air normal On repeat exam after I reviewed the patient's x-ray, discussed her hip fracture she is accompanied by her daughter.  I discussed patient's case with the orthopedic team, patient admitted to internal medicine team with anticipated surgical repair of her hip fracture tomorrow.  Amount and/or Complexity of Data Reviewed Independent Historian: EMS External Data Reviewed: notes. Labs: ordered. Decision-making details documented in ED Course. Radiology: ordered and independent interpretation performed. Decision-making details documented in ED Course. ECG/medicine tests: ordered and independent interpretation performed. Decision-making details documented in ED Course.  Risk Decision regarding hospitalization.  Final Clinical Impression(s) / ED Diagnoses Final diagnoses:  Fall, initial encounter  Closed fracture of right hip, initial encounter [facility])     Gerhard Munch, MD 12/01/22 1721

## 2022-12-02 ENCOUNTER — Other Ambulatory Visit: Payer: Self-pay

## 2022-12-02 ENCOUNTER — Inpatient Hospital Stay (HOSPITAL_COMMUNITY): Payer: Medicare Other

## 2022-12-02 ENCOUNTER — Inpatient Hospital Stay (HOSPITAL_COMMUNITY): Payer: Medicare Other | Admitting: Anesthesiology

## 2022-12-02 ENCOUNTER — Encounter (HOSPITAL_COMMUNITY): Payer: Self-pay | Admitting: Internal Medicine

## 2022-12-02 ENCOUNTER — Encounter (HOSPITAL_COMMUNITY)
Admission: EM | Disposition: A | Discharge status: Skilled Nursing Facility | Payer: Self-pay | Source: Home / Self Care | Attending: Internal Medicine

## 2022-12-02 DIAGNOSIS — S72001A Fracture of unspecified part of neck of right femur, initial encounter for closed fracture: Secondary | ICD-10-CM | POA: Diagnosis not present

## 2022-12-02 DIAGNOSIS — J449 Chronic obstructive pulmonary disease, unspecified: Secondary | ICD-10-CM

## 2022-12-02 DIAGNOSIS — Z87891 Personal history of nicotine dependence: Secondary | ICD-10-CM

## 2022-12-02 DIAGNOSIS — E785 Hyperlipidemia, unspecified: Secondary | ICD-10-CM

## 2022-12-02 DIAGNOSIS — S72141A Displaced intertrochanteric fracture of right femur, initial encounter for closed fracture: Secondary | ICD-10-CM

## 2022-12-02 HISTORY — PX: INTRAMEDULLARY (IM) NAIL INTERTROCHANTERIC: SHX5875

## 2022-12-02 LAB — CBC
HCT: 29.7 % — ABNORMAL LOW (ref 36.0–46.0)
Hemoglobin: 9.7 g/dL — ABNORMAL LOW (ref 12.0–15.0)
MCH: 31.1 pg (ref 26.0–34.0)
MCHC: 32.7 g/dL (ref 30.0–36.0)
MCV: 95.2 fL (ref 80.0–100.0)
Platelets: 155 10*3/uL (ref 150–400)
RBC: 3.12 MIL/uL — ABNORMAL LOW (ref 3.87–5.11)
RDW: 13.7 % (ref 11.5–15.5)
WBC: 6.8 10*3/uL (ref 4.0–10.5)
nRBC: 0 % (ref 0.0–0.2)

## 2022-12-02 LAB — BASIC METABOLIC PANEL
Anion gap: 9 (ref 5–15)
BUN: 28 mg/dL — ABNORMAL HIGH (ref 8–23)
CO2: 26 mmol/L (ref 22–32)
Calcium: 8.7 mg/dL — ABNORMAL LOW (ref 8.9–10.3)
Chloride: 99 mmol/L (ref 98–111)
Creatinine, Ser: 0.53 mg/dL (ref 0.44–1.00)
GFR, Estimated: >60 mL/min (ref >60)
Glucose, Bld: 115 mg/dL — ABNORMAL HIGH (ref 70–99)
Potassium: 4.5 mmol/L (ref 3.5–5.1)
Sodium: 134 mmol/L — ABNORMAL LOW (ref 135–145)

## 2022-12-02 SURGERY — FIXATION, FRACTURE, INTERTROCHANTERIC, WITH INTRAMEDULLARY ROD
Anesthesia: Monitor Anesthesia Care | Site: Hip | Laterality: Right

## 2022-12-02 MED ORDER — ONDANSETRON HCL 4 MG/2ML IJ SOLN
INTRAMUSCULAR | Status: AC
  Filled 2022-12-02: qty 2

## 2022-12-02 MED ORDER — VANCOMYCIN HCL IN DEXTROSE 1-5 GM/200ML-% IV SOLN
1000.0000 mg | Freq: Once | INTRAVENOUS | Status: AC
  Filled 2022-12-02: qty 200

## 2022-12-02 MED ORDER — VANCOMYCIN HCL IN DEXTROSE 1-5 GM/200ML-% IV SOLN
INTRAVENOUS | Status: AC
  Administered 2022-12-03: 1000 mg via INTRAVENOUS
  Filled 2022-12-02: qty 200

## 2022-12-02 MED ORDER — MIDAZOLAM HCL 2 MG/2ML IJ SOLN
INTRAMUSCULAR | Status: AC
  Filled 2022-12-02: qty 2

## 2022-12-02 MED ORDER — PHENYLEPHRINE HCL-NACL 20-0.9 MG/250ML-% IV SOLN
INTRAVENOUS | Status: DC | PRN
Start: 2022-12-02 — End: 2022-12-02
  Administered 2022-12-02: 100 ug/min via INTRAVENOUS

## 2022-12-02 MED ORDER — BUPIVACAINE IN DEXTROSE 0.75-8.25 % IT SOLN
INTRATHECAL | Status: DC | PRN
Start: 2022-12-02 — End: 2022-12-02
  Administered 2022-12-02: 1.6 mL via INTRATHECAL

## 2022-12-02 MED ORDER — VANCOMYCIN HCL IN DEXTROSE 1-5 GM/200ML-% IV SOLN
1000.0000 mg | INTRAVENOUS | Status: AC
  Administered 2022-12-02: 1000 mg via INTRAVENOUS

## 2022-12-02 MED ORDER — 0.9 % SODIUM CHLORIDE (POUR BTL) OPTIME
TOPICAL | Status: DC | PRN
  Administered 2022-12-02: 1000 mL

## 2022-12-02 MED ORDER — ONDANSETRON HCL 4 MG/2ML IJ SOLN
INTRAMUSCULAR | Status: DC | PRN
  Administered 2022-12-02: 4 mg via INTRAVENOUS

## 2022-12-02 MED ORDER — OXYCODONE HCL 5 MG PO TABS
2.5000 mg | ORAL_TABLET | ORAL | Status: DC | PRN
Start: 2022-12-02 — End: 2022-12-02

## 2022-12-02 MED ORDER — ACETAMINOPHEN 325 MG PO TABS
650.0000 mg | ORAL_TABLET | Freq: Four times a day (QID) | ORAL | Status: DC | PRN
  Administered 2022-12-06: 650 mg via ORAL
  Filled 2022-12-02: qty 2

## 2022-12-02 MED ORDER — HYDROCODONE-ACETAMINOPHEN 5-325 MG PO TABS: 0.5000 | ORAL_TABLET | ORAL | Status: DC | PRN

## 2022-12-02 MED ORDER — ENOXAPARIN SODIUM 40 MG/0.4ML IJ SOSY: 40.0000 mg | PREFILLED_SYRINGE | INTRAMUSCULAR | Status: DC

## 2022-12-02 MED ORDER — FENTANYL CITRATE PF 50 MCG/ML IJ SOSY
PREFILLED_SYRINGE | INTRAMUSCULAR | Status: AC
  Administered 2022-12-02: 25 ug via INTRAVENOUS
  Filled 2022-12-02: qty 2

## 2022-12-02 MED ORDER — ISOPROPYL ALCOHOL 70 % SOLN
Status: DC | PRN
  Administered 2022-12-02: 1 via TOPICAL

## 2022-12-02 MED ORDER — HYDROMORPHONE HCL 1 MG/ML IJ SOLN
0.5000 mg | INTRAMUSCULAR | Status: DC | PRN
  Administered 2022-12-02 – 2022-12-07 (×8): 0.5 mg via INTRAVENOUS
  Filled 2022-12-02 (×8): qty 0.5

## 2022-12-02 MED ORDER — TRAMADOL HCL 50 MG PO TABS
50.0000 mg | ORAL_TABLET | Freq: Four times a day (QID) | ORAL | Status: DC | PRN
  Administered 2022-12-02: 50 mg via ORAL
  Filled 2022-12-02: qty 1

## 2022-12-02 MED ORDER — AMISULPRIDE (ANTIEMETIC) 5 MG/2ML IV SOLN: 10.0000 mg | Freq: Once | INTRAVENOUS | Status: DC | PRN

## 2022-12-02 MED ORDER — PROPOFOL 500 MG/50ML IV EMUL
INTRAVENOUS | Status: DC | PRN
  Administered 2022-12-02: 50 ug/kg/min via INTRAVENOUS
  Administered 2022-12-02: 20 mg via INTRAVENOUS

## 2022-12-02 MED ORDER — GENTAMICIN SULFATE 40 MG/ML IJ SOLN
5.0000 mg/kg | INTRAVENOUS | Status: AC
  Administered 2022-12-02: 320 mg via INTRAVENOUS
  Filled 2022-12-02: qty 8

## 2022-12-02 MED ORDER — ONDANSETRON HCL 4 MG/2ML IJ SOLN: 4.0000 mg | Freq: Once | INTRAMUSCULAR | Status: DC | PRN

## 2022-12-02 MED ORDER — FENTANYL CITRATE (PF) 100 MCG/2ML IJ SOLN
INTRAMUSCULAR | Status: DC | PRN
  Administered 2022-12-02: 50 ug via INTRAVENOUS

## 2022-12-02 MED ORDER — PROPOFOL 1000 MG/100ML IV EMUL
INTRAVENOUS | Status: AC
  Filled 2022-12-02: qty 100

## 2022-12-02 MED ORDER — LACTATED RINGERS IV SOLN: INTRAVENOUS | Status: DC

## 2022-12-02 MED ORDER — HYDROCODONE-ACETAMINOPHEN 5-325 MG PO TABS
0.5000 | ORAL_TABLET | ORAL | Status: DC | PRN
  Administered 2022-12-03: 1 via ORAL
  Filled 2022-12-02: qty 1

## 2022-12-02 MED ORDER — VASOPRESSIN 20 UNIT/ML IV SOLN
INTRAVENOUS | Status: AC
  Filled 2022-12-02: qty 1

## 2022-12-02 MED ORDER — VANCOMYCIN HCL 1000 MG IV SOLR
INTRAVENOUS | Status: DC | PRN
Start: 2022-12-02 — End: 2022-12-02
  Administered 2022-12-02: 1000 mg via TOPICAL

## 2022-12-02 MED ORDER — VANCOMYCIN HCL 1000 MG IV SOLR
INTRAVENOUS | Status: AC
  Filled 2022-12-02: qty 20

## 2022-12-02 MED ORDER — GLYCOPYRROLATE 0.2 MG/ML IJ SOLN
INTRAMUSCULAR | Status: DC | PRN
Start: 2022-12-02 — End: 2022-12-02
  Administered 2022-12-02: .2 mg via INTRAVENOUS

## 2022-12-02 MED ORDER — EPHEDRINE SULFATE-NACL 50-0.9 MG/10ML-% IV SOSY
PREFILLED_SYRINGE | INTRAVENOUS | Status: DC | PRN
Start: 2022-12-02 — End: 2022-12-02
  Administered 2022-12-02: 10 mg via INTRAVENOUS
  Administered 2022-12-02: 5 mg via INTRAVENOUS
  Administered 2022-12-02 (×2): 10 mg via INTRAVENOUS

## 2022-12-02 MED ORDER — BUPIVACAINE-EPINEPHRINE 0.25% -1:200000 IJ SOLN
INTRAMUSCULAR | Status: DC | PRN
  Administered 2022-12-02: 30 mL

## 2022-12-02 MED ORDER — FENTANYL CITRATE (PF) 100 MCG/2ML IJ SOLN
INTRAMUSCULAR | Status: AC
  Filled 2022-12-02: qty 2

## 2022-12-02 MED ORDER — FENTANYL CITRATE PF 50 MCG/ML IJ SOSY
25.0000 ug | PREFILLED_SYRINGE | INTRAMUSCULAR | Status: DC | PRN
  Administered 2022-12-02: 25 ug via INTRAVENOUS

## 2022-12-02 MED ORDER — LACTATED RINGERS IV SOLN
INTRAVENOUS | Status: DC | PRN
Start: 2022-12-02 — End: 2022-12-02

## 2022-12-02 SURGICAL SUPPLY — 49 items
ADH SKN CLS APL DERMABOND .7 (GAUZE/BANDAGES/DRESSINGS) ×1
APL PRP STRL LF DISP 70% ISPRP (MISCELLANEOUS) ×1
BAG COUNTER SPONGE SURGICOUNT (BAG) ×1 IMPLANT
BAG SPNG CNTER NS LX DISP (BAG) ×1
BIT DRILL INTERTAN LAG SCREW (BIT) IMPLANT
BIT DRILL SHORT 4.0 (BIT) IMPLANT
CHLORAPREP W/TINT 26 (MISCELLANEOUS) ×1 IMPLANT
COVER MAYO STAND STRL (DRAPES) IMPLANT
COVER PERINEAL POST (MISCELLANEOUS) ×1 IMPLANT
COVER SURGICAL LIGHT HANDLE (MISCELLANEOUS) ×1 IMPLANT
DERMABOND ADVANCED .7 DNX12 (GAUZE/BANDAGES/DRESSINGS) IMPLANT
DRAPE C-ARM 42X120 X-RAY (DRAPES) ×1 IMPLANT
DRAPE C-ARMOR (DRAPES) ×1 IMPLANT
DRAPE STERI IOBAN 125X83 (DRAPES) ×1 IMPLANT
DRAPE U-SHAPE 47X51 STRL (DRAPES) ×2 IMPLANT
DRESSING MEPILEX FLEX 4X4 (GAUZE/BANDAGES/DRESSINGS) IMPLANT
DRILL BIT SHORT 4.0 (BIT) ×1
DRSG MEPILEX FLEX 4X4 (GAUZE/BANDAGES/DRESSINGS)
DRSG TEGADERM 4X4.75 (GAUZE/BANDAGES/DRESSINGS) ×3 IMPLANT
ELECT REM PT RETURN 15FT ADLT (MISCELLANEOUS) IMPLANT
GAUZE SPONGE 4X4 12PLY STRL (GAUZE/BANDAGES/DRESSINGS) ×1 IMPLANT
GLOVE BIO SURGEON STRL SZ 6.5 (GLOVE) ×1 IMPLANT
GLOVE BIOGEL PI IND STRL 6.5 (GLOVE) ×1 IMPLANT
GLOVE BIOGEL PI IND STRL 8 (GLOVE) ×1 IMPLANT
GLOVE SURG ORTHO 8.0 STRL STRW (GLOVE) ×1 IMPLANT
GOWN STRL REUS W/ TWL LRG LVL3 (GOWN DISPOSABLE) ×1 IMPLANT
GOWN STRL REUS W/ TWL XL LVL3 (GOWN DISPOSABLE) ×2 IMPLANT
GOWN STRL REUS W/TWL LRG LVL3 (GOWN DISPOSABLE) ×1
GOWN STRL REUS W/TWL XL LVL3 (GOWN DISPOSABLE) ×2
GUIDE PIN 3.2X343 (PIN) ×2
GUIDE PIN 3.2X343MM (PIN) ×2
GUIDE ROD 3.0 (MISCELLANEOUS) ×1
KIT BASIN OR (CUSTOM PROCEDURE TRAY) ×1 IMPLANT
KIT TURNOVER KIT A (KITS) IMPLANT
MANIFOLD NEPTUNE II (INSTRUMENTS) ×1 IMPLANT
NAIL LOCK CANN 10X380 130D RT (Nail) IMPLANT
NS IRRIG 1000ML POUR BTL (IV SOLUTION) ×1 IMPLANT
PACK GENERAL/GYN (CUSTOM PROCEDURE TRAY) ×1 IMPLANT
PIN GUIDE 3.2X343MM (PIN) IMPLANT
ROD GUIDE 3.0 (MISCELLANEOUS) IMPLANT
SCREW LAG COMPR KIT 95/90 (Screw) IMPLANT
SCREW TRIGEN LOW PROF 5.0X42.5 (Screw) IMPLANT
STRIP CLOSURE SKIN 1/2X4 (GAUZE/BANDAGES/DRESSINGS) ×1 IMPLANT
SUT MNCRL AB 3-0 PS2 18 (SUTURE) ×1 IMPLANT
SUT VIC AB 0 CT1 36 (SUTURE) ×1 IMPLANT
SUT VIC AB 1 CT1 36 (SUTURE) ×1 IMPLANT
SUT VIC AB 2-0 CT2 27 (SUTURE) ×2 IMPLANT
TOWEL OR 17X26 10 PK STRL BLUE (TOWEL DISPOSABLE) ×1 IMPLANT
TOWEL OR NON WOVEN STRL DISP B (DISPOSABLE) ×1 IMPLANT

## 2022-12-02 NOTE — Anesthesia Procedure Notes (Signed)
Spinal  Patient location during procedure: OR Start time: 12/02/2022 12:05 PM End time: 12/02/2022 12:14 PM Reason for block: surgical anesthesia Staffing Performed: anesthesiologist  Anesthesiologist: Lannie Fields, DO Performed by: Lannie Fields, DO Authorized by: Achille Rich, MD   Preanesthetic Checklist Completed: patient identified, IV checked, risks and benefits discussed, surgical consent, monitors and equipment checked, pre-op evaluation and timeout performed Spinal Block Patient position: right lateral decubitus Prep: DuraPrep and site prepped and draped Patient monitoring: cardiac monitor, continuous pulse ox and blood pressure Approach: midline Location: L3-4 Injection technique: single-shot Needle Needle type: Quincke  Needle gauge: 22 G Needle length: 9 cm Assessment Sensory level: T6 Events: CSF return Additional Notes Functioning IV was confirmed and monitors were applied. Sterile prep and drape, including hand hygiene and sterile gloves were used. The patient was positioned and the spine was prepped. The skin was anesthetized with lidocaine.  Free flow of clear CSF was obtained prior to injecting local anesthetic into the CSF.  The spinal needle aspirated freely following injection.  The needle was carefully withdrawn.  The patient tolerated the procedure well.

## 2022-12-02 NOTE — Progress Notes (Signed)
Met with patient and her daughter at bedside today.  Localizes pain to the right hip area after fall.  Denies pain of the joints or extremities.  Denies distal numbness and tingling.  Discussed radiographic findings of intertrochanteric femur fracture recommended intramedullary nail fixation.  Plan for OR this afternoon.  Discussed with patient who reports that most of her medication allergies are not true allergies and willing to try tramadol and hydrocodone for pain.  As well as Tylenol.    The risks benefits and alternatives were discussed with the patient including but not limited to the risks of nonoperative treatment, versus surgical intervention including infection, bleeding, nerve injury, malunion, nonunion, the need for revision surgery, hardware prominence, hardware failure, the need for hardware removal, blood clots, cardiopulmonary complications, morbidity, mortality, among others, and they were willing to proceed.

## 2022-12-02 NOTE — Op Note (Signed)
DATE OF SURGERY:  12/02/2022  TIME: 1:59 PM  PATIENT NAME:  Judith Stevens  AGE: 82 y.o.  PRE-OPERATIVE DIAGNOSIS:  Right displaced intertrochanteric femoral fracture  POST-OPERATIVE DIAGNOSIS:  SAME  PROCEDURE:  INTRAMEDULLARY (IM) NAIL INTERTROCHANTERIC  SURGEON:  Loella Hickle A Lashonta Pilling  ASSISTANT: Kathie Dike, PA-C, was present and scrubbed throughout the case, critical for assistance with exposure, retraction, instrumentation, and closure.  OPERATIVE IMPLANTS:  [employer]a [employer] Intertan Nail 10 x 380 mm , 95/90 lag compression screw, 1 distal interlock  Implant Name Type Inv. Item Serial No. Manufacturer Lot No. LRB No. Used Action  NAIL LOCK CANN 10X380 130D RT - [id number] Nail NAIL LOCK CANN 10X380 130D RT  [employer] [id number] Right 1 Implanted  SCREW LAG COMPR KIT 95/90 - [id number] Screw SCREW LAG COMPR KIT 95/90  [employer] [id number] Right 1 Implanted  SCREW TRIGEN LOW PROF 5.0X42.5 - [id number] Screw SCREW TRIGEN LOW PROF 5.0X42.5  [employer] [id number] Right 1 Implanted   ESTIMATED BLOOD LOSS: 100cc  PREOPERATIVE INDICATIONS:  Judith Stevens is a 82 y.o. year old who fell and suffered an Right intertrochanteric femoral fracture. She was brought into the ER and then admitted and optimized and then elected for surgical intervention.    The risks benefits and alternatives were discussed with the patient including but not limited to the risks of nonoperative treatment, versus surgical intervention including infection, bleeding, nerve injury, malunion, nonunion, hardware prominence, hardware failure, need for hardware removal, blood clots, cardiopulmonary complications, morbidity, mortality, among others, and they were willing to proceed.    OPERATIVE PROCEDURE:  The patient was brought to the operating room and spinal anesthetic was performed.  She was positioned supine on the Hana table.  1 g of vancomycin  and gentamicin IV were given for preoperative antibiotics given her multiple cephalosporin allergies.  Closed reduction was performed under C-arm guidance.  Time out was then performed after sterile prep and drape.   Small incision proximal to the greater trochanter was made and carried down through skin and subcutaneous tissue.  Threaded guidewire was directed at the tip of the greater trochanter and advanced into the proximal metaphysis.  Positioning was confirmed with fluoroscopy.  I then used an entry reamer to enter the medullary canal.  Guidewire was placed down to the knee.  Nail length was measured.  380 mm was felt to be the appropriate length.  We sequentially reamed starting with a 9 mm reamer up to 11 mm, 1 mm above the desired nail diameter.  We had good chatter with the 10 and 11 mm reamers.  I then passed a 10 x 380 mm InterTAN down the center of the canal attached to the targeting arm.  I then used the targeting arm to make a percutaneous incision and directed a threaded guidewire up into the head/neck segment.  I confirmed adequate tip apex distance and measured the length.  I decided to place a 95 mm screw.  I then drilled the path for the compression screw and placed an antirotation bar.  I then placed the lag screw and then placed the compression screw and compressed approximately 5 mm.  The proximal portion of the nail was statically locked.   I used the targeting arm to place a distal interlocking screw.  The targeting arm was removed.  Final fluoroscopic imaging was obtained.    The wounds were irrigated copiously, and vancomycin powder was placed in the wounds.  The gluteal fascia was closed with ` Vicryl, and skin was closed with 2-0 Vicryl and 3-0 Monocryl.  Sterile dressing was applied with Dermabond, 4 x 4 gauze, and Tegaderm.  The patient was awakened and returned to PACU in stable and satisfactory condition. There were no complications and the patient tolerated the procedure  well.   Post op recs: WB: WBAT RLE Abx: Vanco and gentamicin given perioperative  Imaging: PACU xrays Dressing: keep intact until follow up, change PRN if soiled or saturated. DVT prophylaxis: lovenox starting POD1, consider discharging on Xarelto or Eliquis prophylaxis dose postop Follow up: 2 weeks after surgery for a wound check with Dr. Blanchie Dessert at [facility].  Address: [address], [place], [place] [address]  Office Phone: [phone]  Weber Cooks, MD Orthopaedic Surgery

## 2022-12-02 NOTE — Discharge Instructions (Signed)
Orthopedic Discharge Instructions  Diet: As you were doing prior to hospitalization   Shower:  May shower but keep the wounds dry, use an occlusive plastic wrap, NO SOAKING IN TUB.  If the bandage gets wet, change with a clean dry gauze.  If you have a splint on, leave the splint in place and keep the splint dry with a plastic bag.  Dressing:  You may change your dressing 3-5 days after surgery.  If the dressing remains clean and dry it can also be left on until follow up.  If you change the dressing replace with clean gauze and tape or ace wrap.    Activity:  Increase activity slowly as tolerated, but follow the weight bearing instructions below.  The rules on driving is that you can not be taking narcotics while you drive, and you must feel in control of the vehicle.    Weight Bearing:   You may bear weight on your right leg as tolerated.    Blood clot prevention (DVT Prophylaxis): After surgery you are at an increased risk for a blood clot. you were prescribed a blood thinner, lovenox 40mg , to be taken once daily for a total of 4 weeks from surgery to help reduce your risk of getting a blood clot. This will help prevent a blood clot. Signs of a pulmonary embolus (blood clot in the lungs) include sudden short of breath, feeling lightheaded or dizzy, chest pain with a deep breath, rapid pulse rapid breathing. Signs of a blood clot in your arms or legs include new unexplained swelling and cramping, warm, red or darkened skin around the painful area. Please call the office or 911 right away if these signs or symptoms develop.  To prevent constipation: you may use a stool softener such as - Colace (over the counter) 100 mg by mouth twice a day  Drink plenty of fluids (prune juice may be helpful) and high fiber foods Miralax (over the counter) for constipation as needed.    Itching:  If you experience itching with your medications, try taking only a single pain pill, or even half a pain pill at a  time.  You may take up to 10 pain pills per day, and you can also use benadryl over the counter for itching or also to help with sleep.   Precautions:  If you experience chest pain or shortness of breath - call 911 immediately for transfer to the hospital emergency department!!   Call office [phone]) for the following: Temperature greater than 101F Persistent nausea and vomiting Severe uncontrolled pain Redness, tenderness, or signs of infection (pain, swelling, redness, odor or green/yellow discharge around the site) Difficulty breathing, headache or visual disturbances Hives Persistent dizziness or light-headedness Extreme fatigue Any other questions or concerns you may have after discharge  In an emergency, call 911 or go to an Emergency Department at a nearby hospital  Follow- Up Appointment:  Please call for an appointment to be seen approximately 2-3 week after surgery in [facility] with your surgeon Dr. Weber Cooks - [phone] Address: [address], [place], [place] [address]

## 2022-12-02 NOTE — Anesthesia Procedure Notes (Signed)
Arterial Line Insertion Start/End9/06/2022 12:39 PM, 12/02/2022 12:41 PM Performed by: Lannie Fields, DO, anesthesiologist  Patient location: Pre-op. Preanesthetic checklist: patient identified, IV checked, site marked, risks and benefits discussed, surgical consent, monitors and equipment checked, pre-op evaluation, timeout performed and anesthesia consent Lidocaine 1% used for infiltration Right, radial was placed Catheter size: 20 G Hand hygiene performed  and maximum sterile barriers used   Attempts: 1 Procedure performed without using ultrasound guided technique. Following insertion, dressing applied. Post procedure assessment: normal and unchanged

## 2022-12-02 NOTE — Transfer of Care (Signed)
Immediate Anesthesia Transfer of Care Note  Patient: Judith Stevens  Procedure(s) Performed: INTRAMEDULLARY (IM) NAIL INTERTROCHANTERIC (Right: Hip)  Patient Location: PACU  Anesthesia Type:MAC and Spinal  Level of Consciousness: awake  Airway & Oxygen Therapy: Patient Spontanous Breathing  Post-op Assessment: Report given to RN and Post -op Vital signs reviewed and stable  Post vital signs: Reviewed  Last Vitals:  Vitals Value Taken Time  BP 68/31 12/02/22 1403  Temp 36.6 C 12/02/22 1356  Pulse 63 12/02/22 1407  Resp 13 12/02/22 1407  SpO2 98 % 12/02/22 1407  Vitals shown include unfiled device data.  Last Pain:  Vitals:   12/02/22 1400  TempSrc:   PainSc: 0-No pain         Complications: No notable events documented.

## 2022-12-02 NOTE — Plan of Care (Signed)
  Problem: Education: Goal: Knowledge of General Education information will improve Description: Including pain rating scale, medication(s)/side effects and non-pharmacologic comfort measures Outcome: Progressing   Problem: Health Behavior/Discharge Planning: Goal: Ability to manage health-related needs will improve Outcome: Progressing   Problem: Clinical Measurements: Goal: Ability to maintain clinical measurements within normal limits will improve Outcome: Progressing Goal: Will remain free from infection Outcome: Progressing Goal: Diagnostic test results will improve Outcome: Progressing Goal: Respiratory complications will improve Outcome: Progressing Goal: Cardiovascular complication will be avoided Outcome: Progressing  Pt A/Ox4 on RA. Severe R hip pain, PRN fentanyl administered frequently with little relief.  Pt did not void for beginning of shift, bladder scan , foley ordered and placed. Plans for surgery this AM. NPO.

## 2022-12-02 NOTE — Progress Notes (Signed)
[facility] Progress Note Patient: Judith Stevens JXB:[id number] DOB: [birth year] DOA: 12/01/2022  DOS: the patient was seen and examined on 12/02/2022  Brief hospital course: PMH of CAD, COPD, HLD, former smoker presented to the hospital with complaints of right hip fracture after mechanical fall. Although she reports symptoms of vertigo intermittently whenever she is moving her head.  Assessment and Plan: Right acute mildly displaced proximal femoral intertrochanteric fracture. Orthopedic consulted. Underwent intramedullary nail implant. Pain management and DVT prophylaxis per orthopedic. Monitor. Weightbearing per orthopedic as well. PT OT consulted.  Unwitnessed fall. Suspected BPPV. Will request vestibular evaluation as well. On my exam no significant nystagmus identified.  CAD. Remote history. Currently no angina symptoms. Underwent surgery without any complication. Monitor for  HTN. Patient is on bisoprolol, Cardizem, HCTZ, Imdur, Benicar. Her blood pressure is actually rather soft right now. Will be holding these medications and monitor. GERD. Continue PPI.  Mood disorder. Continue Zoloft.  Continue as needed Xanax.  HLD. Continue statin.  Normocytic anemia. Suspect this is dilutional in nature.  At risk of further drop after surgery. On presentation hemoglobin 10.7.  At baseline around 12.8. Currently 9.7. Patient has provided consent for blood transfusion.   Subjective: Pain well-controlled.  No nausea no vomiting no fever no chills.  Passing gas.  No BM.  Physical Exam: General: in Mild distress, No Rash Cardiovascular: S1 and S2 Present, No Murmur Respiratory: Good respiratory effort, Bilateral Air entry present. No Crackles, No wheezes Abdomen: Bowel Sound present, No tenderness Extremities: No edema Neuro: Alert and oriented x3, no new focal deficit  Data Reviewed: I have Reviewed nursing notes, Vitals, and Lab results. Since last  encounter, pertinent lab results CBC and BMP   . I have ordered test including CBC and BMP  .   Disposition: Status is: Inpatient Remains inpatient appropriate because: Monitor postop recovery  enoxaparin (LOVENOX) injection 40 mg Start: 12/03/22 1000 SCDs Start: 12/01/22 1732   Family Communication: Family at bedside Level of care: Med-Surg   Vitals:   12/02/22 1515 12/02/22 1530 12/02/22 1545 12/02/22 1700  BP: (!) 112/52 (!) 107/52 (!) 100/46 (!) 105/49  Pulse: (!) 57 (!) 56 (!) 56 (!) 56  Resp: 15 12 15    Temp: 98.1 F (36.7 C)   98 F (36.7 C)  TempSrc:    Oral  SpO2: 93% 93% 94% 98%  Weight:      Height:         Author: Lynden Oxford, MD 12/02/2022 5:31 PM  Please look on [email or web address] to find out who is on call.

## 2022-12-02 NOTE — Hospital Course (Addendum)
Brief hospital course: PMH of CAD, COPD, HLD, former smoker presented to the hospital with complaints of right hip fracture after mechanical fall. Although she reports symptoms of vertigo intermittently whenever she is moving her head.  Assessment and Plan: Right acute mildly displaced proximal femoral intertrochanteric fracture. Orthopedic consulted. Underwent intramedullary nail implant. Pain management and DVT prophylaxis per orthopedic. Monitor. Weightbearing per orthopedic as well. PT OT consulted.  Unwitnessed fall. Suspected BPPV. Will request vestibular evaluation as well. On my exam no significant nystagmus identified.  Acute kidney injury. Mild hyponatremia. Likely ischemic injury in the setting of hemodynamic insult from hypotension intraoperatively and perioperatively. Patient required pressors after the surgery. Blood pressure improving. FENa 0.3 %, suggesting prerenal etiology.  Urine sodium less than 10. Currently holding all antihypertensive medication. Continue with IV hydration. Ultrasound renal as well.  Acute normocytic anemia Acute thrombocytopenia. Expected acute blood loss anemia. Dilutional anemia. Patient baseline hemoglobin was around 12-13. On presentation hemoglobin around 11. Dropped down to 10 preoperatively and currently around 7 postoperatively. Received 1 PRBC 9/6. Repeat H&H relatively stable.  Mild swelling at the surgical site but no significant worsening ongoing hematoma. No evidence of ongoing bleeding.  Monitor. Platelet count significantly low as well.  Likely dilutional component there as well.  Monitor. Holding DVT prophylaxis for now.  Acute delirium. Resolved. Likely in the setting of recent anesthesia use, pain medication use as well as acute kidney injury and hypotension. Monitor.  No focal deficit.  CAD. Remote history. Currently no angina symptoms. Underwent surgery without any complication. Monitor  HTN. Patient is  on bisoprolol, Cardizem, HCTZ, Imdur, Benicar. Her blood pressure is actually rather soft right now. Will be holding these medications and monitor.  GERD. Continue PPI.  Mood disorder. Reportedly patient is supposed to be on Zoloft in the past and Lexapro right now although unable to verify from the pharmacy sources about presence of this medication. Both have been discontinued. Continue as needed Xanax.  Confirm from PDMP review that the patient is taking this medication.  HLD. Continue statin.

## 2022-12-02 NOTE — Progress Notes (Signed)
Chaplain engaged in an initial visit with Judith Stevens. Judith Stevens has already completed a healthcare POA document with her lawyer. Chaplain was able to scan the document into the [employer] document system. Her healthcare POA paperwork should appear in the system within 48 hours. Chaplain did confirm that a copy had already been placed in her medical chart.   Judith Stevens did some processing around family dynamics with her children as well as talked about her upcoming surgery. Chaplain provided reflective listening, a compassionate presence, healthcare POA education, and support.     12/02/22 1000  Spiritual Encounters  Type of Visit Initial  Care provided to: Pt and family  Reason for visit Advance directives

## 2022-12-02 NOTE — Plan of Care (Signed)

## 2022-12-02 NOTE — Anesthesia Preprocedure Evaluation (Addendum)
Anesthesia Evaluation  Patient identified by MRN, date of birth, ID band Patient awake    Reviewed: Allergy & Precautions, NPO status , Patient's Chart, lab work & pertinent test results, reviewed documented beta blocker date and time   Airway Mallampati: III  TM Distance: >3 FB Neck ROM: Full    Dental  (+) Teeth Intact, Dental Advisory Given   Pulmonary asthma , COPD, former smoker Quit smoking 2021, 60 pack year history  Snores at night, negative sleep study years ago   Pulmonary exam normal breath sounds clear to auscultation       Cardiovascular hypertension (136/43 preop), Pt. on medications and Pt. on home beta blockers Normal cardiovascular exam+ Valvular Problems/Murmurs (mild AS) AS  Rhythm:Regular Rate:Normal  Echo 03/2022  1. Left ventricular ejection fraction, by estimation, is 65 to 70%. Left  ventricular ejection fraction by 3D volume is 68 %. The left ventricle has  normal function. The left ventricle has no regional wall motion  abnormalities. Left ventricular diastolic   parameters are consistent with Grade I diastolic dysfunction (impaired  relaxation). Elevated left ventricular end-diastolic pressure. The E/e' is  15. The average left ventricular global longitudinal strain is -20.0 %.  The global longitudinal strain is  normal.   2. Right ventricular systolic function is normal. The right ventricular  size is normal. There is normal pulmonary artery systolic pressure. The  estimated right ventricular systolic pressure is 31.9 mmHg.   3. The mitral valve is abnormal. Trivial mitral valve regurgitation.   4. The aortic valve is tricuspid. There is moderate calcification of the  aortic valve. There is mild thickening of the aortic valve. Aortic valve  regurgitation is trivial. Mild aortic valve stenosis. Aortic valve area,  by VTI measures 1.64 cm. Aortic  valve mean gradient measures 8.0 mmHg. Aortic valve Vmax  measures 1.75  m/s. Peak gradient 12 mmHg, DI is 0.47.   5. The inferior vena cava is normal in size with greater than 50%  respiratory variability, suggesting right atrial pressure of 3 mmHg.      Neuro/Psych  Headaches PSYCHIATRIC DISORDERS Anxiety Depression       GI/Hepatic Neg liver ROS,GERD  Controlled,,  Endo/Other  negative endocrine ROS    Renal/GU negative Renal ROS  negative genitourinary   Musculoskeletal  (+) Arthritis , Osteoarthritis,  R intertrochanteric femur fx   Abdominal   Peds  Hematology  (+) Blood dyscrasia, anemia Hb 9.7   Anesthesia Other Findings   Reproductive/Obstetrics negative OB ROS                             Anesthesia Physical Anesthesia Plan  ASA: 3  Anesthesia Plan: MAC and Spinal   Post-op Pain Management: Tylenol PO (pre-op)*   Induction:   PONV Risk Score and Plan: 2 and Propofol infusion and TIVA  Airway Management Planned: Natural Airway and Simple Face Mask  Additional Equipment: None  Intra-op Plan:   Post-operative Plan:   Informed Consent: I have reviewed the patients History and Physical, chart, labs and discussed the procedure including the risks, benefits and alternatives for the proposed anesthesia with the patient or authorized representative who has indicated his/her understanding and acceptance.       Plan Discussed with: CRNA  Anesthesia Plan Comments:        Anesthesia Quick Evaluation

## 2022-12-03 ENCOUNTER — Encounter (HOSPITAL_COMMUNITY): Payer: Self-pay | Admitting: Orthopedic Surgery

## 2022-12-03 DIAGNOSIS — S72001A Fracture of unspecified part of neck of right femur, initial encounter for closed fracture: Secondary | ICD-10-CM | POA: Diagnosis not present

## 2022-12-03 LAB — CBC
HCT: 23.3 % — ABNORMAL LOW (ref 36.0–46.0)
HCT: 22.1 % — ABNORMAL LOW (ref 36.0–46.0)
Hemoglobin: 7.6 g/dL — ABNORMAL LOW (ref 12.0–15.0)
Hemoglobin: 7.2 g/dL — ABNORMAL LOW (ref 12.0–15.0)
MCH: 31.4 pg (ref 26.0–34.0)
MCH: 31.2 pg (ref 26.0–34.0)
MCHC: 32.6 g/dL (ref 30.0–36.0)
MCHC: 32.6 g/dL (ref 30.0–36.0)
MCV: 96.3 fL (ref 80.0–100.0)
MCV: 95.7 fL (ref 80.0–100.0)
Platelets: 116 10*3/uL — ABNORMAL LOW (ref 150–400)
Platelets: 118 10*3/uL — ABNORMAL LOW (ref 150–400)
RBC: 2.42 MIL/uL — ABNORMAL LOW (ref 3.87–5.11)
RBC: 2.31 MIL/uL — ABNORMAL LOW (ref 3.87–5.11)
RDW: 13.5 % (ref 11.5–15.5)
RDW: 13.5 % (ref 11.5–15.5)
WBC: 9.7 10*3/uL (ref 4.0–10.5)
WBC: 8.6 10*3/uL (ref 4.0–10.5)
nRBC: 0 % (ref 0.0–0.2)
nRBC: 0 % (ref 0.0–0.2)

## 2022-12-03 LAB — BASIC METABOLIC PANEL
Anion gap: 10 (ref 5–15)
BUN: 39 mg/dL — ABNORMAL HIGH (ref 8–23)
CO2: 23 mmol/L (ref 22–32)
Calcium: 8.2 mg/dL — ABNORMAL LOW (ref 8.9–10.3)
Chloride: 100 mmol/L (ref 98–111)
Creatinine, Ser: 1.54 mg/dL — ABNORMAL HIGH (ref 0.44–1.00)
GFR, Estimated: 34 mL/min — ABNORMAL LOW (ref >60)
Glucose, Bld: 114 mg/dL — ABNORMAL HIGH (ref 70–99)
Potassium: 4.8 mmol/L (ref 3.5–5.1)
Sodium: 133 mmol/L — ABNORMAL LOW (ref 135–145)

## 2022-12-03 LAB — PREPARE RBC (CROSSMATCH)

## 2022-12-03 LAB — MAGNESIUM: Magnesium: 1.9 mg/dL (ref 1.7–2.4)

## 2022-12-03 MED ORDER — HYDROCODONE-ACETAMINOPHEN 5-325 MG PO TABS
0.5000 | ORAL_TABLET | ORAL | Status: DC | PRN
  Administered 2022-12-03 – 2022-12-07 (×12): 1 via ORAL
  Filled 2022-12-03 (×13): qty 1

## 2022-12-03 MED ORDER — SODIUM CHLORIDE 0.9% IV SOLUTION: Freq: Once | INTRAVENOUS | Status: AC

## 2022-12-03 MED ORDER — ENOXAPARIN SODIUM 30 MG/0.3ML IJ SOSY: 30.0000 mg | PREFILLED_SYRINGE | INTRAMUSCULAR | Status: DC

## 2022-12-03 MED ORDER — SODIUM CHLORIDE 0.9 % IV SOLN: INTRAVENOUS | Status: DC

## 2022-12-03 MED ORDER — MELATONIN 3 MG PO TABS
3.0000 mg | ORAL_TABLET | Freq: Every day | ORAL | Status: DC
  Administered 2022-12-03 – 2022-12-06 (×4): 3 mg via ORAL
  Filled 2022-12-03 (×4): qty 1

## 2022-12-03 NOTE — Progress Notes (Signed)
[facility] Progress Note Patient: Judith Stevens WGN:[id number] DOB: 04/05/1940 DOA: 12/01/2022  DOS: the patient was seen and examined on 12/03/2022  Brief hospital course: PMH of CAD, COPD, HLD, former smoker presented to the hospital with complaints of right hip fracture after mechanical fall. Although she reports symptoms of vertigo intermittently whenever she is moving her head.  Assessment and Plan: Right acute mildly displaced proximal femoral intertrochanteric fracture. Orthopedic consulted. Underwent intramedullary nail implant. Pain management and DVT prophylaxis per orthopedic. Monitor. Weightbearing per orthopedic as well. PT OT consulted.  Unwitnessed fall. Suspected BPPV. Will request vestibular evaluation as well. On my exam no significant nystagmus identified.  Acute kidney injury. Likely ischemic injury in the setting of hemodynamic insult from hypotension intraoperatively and perioperatively. Patient required pressors after the surgery. Blood pressure improving. Currently holding all antihypertensive medication. Continue with IV hydration. Monitor with bladder scan.  Acute normocytic anemia. Expected acute blood loss anemia. Dilutional anemia. Patient baseline hemoglobin was around 12-13. On presentation hemoglobin around 11. Dropped down to 10 preoperatively and currently around 7 postoperatively. Will transfuse 1 PRBC. No evidence of ongoing bleeding.  Monitor.  Acute delirium. Likely in the setting of recent anesthesia use, pain medication use as well as acute kidney injury and hypotension. Anticipate improvement in next 48 to 72 hours. Monitor.  No focal deficit.  CAD. Remote history. Currently no angina symptoms. Underwent surgery without any complication. Monitor  HTN. Patient is on bisoprolol, Cardizem, HCTZ, Imdur, Benicar. Her blood pressure is actually rather soft right now. Will be holding these medications and  monitor.  GERD. Continue PPI.  Mood disorder. Continue Zoloft.  Continue as needed Xanax.  HLD. Continue statin.   Subjective: No nausea no vomiting no fever no chills.  Somewhat confused although able to continue to thread of conversation that we had yesterday.  Pain well-controlled.  Physical Exam: General: in Mild distress, No Rash Cardiovascular: S1 and S2 Present, No Murmur Respiratory: Good respiratory effort, Bilateral Air entry present. No Crackles, No wheezes Abdomen: Bowel Sound present, No tenderness Extremities: No edema Neuro: Alert and oriented x3, no new focal deficit  Data Reviewed: I have Reviewed nursing notes, Vitals, and Lab results. Since last encounter, pertinent lab results CBC and BMP   . I have ordered test including CBC and BMP  .   Disposition: Status is: Inpatient Remains inpatient appropriate because: Monitor for postop recovery  enoxaparin (LOVENOX) injection 30 mg Start: 12/04/22 1000 SCDs Start: 12/01/22 1732   Family Communication: Discussed with daughter at bedside Level of care: Med-Surg   Vitals:   12/03/22 0826 12/03/22 1232 12/03/22 1645 12/03/22 1735  BP: (!) 126/54 114/70 (!) 127/37 114/64  Pulse: 64 63 64 64  Resp: 18 18 18 18   Temp: 98.4 F (36.9 C) 98.2 F (36.8 C) 98.7 F (37.1 C) 99 F (37.2 C)  TempSrc: Oral Oral Oral Oral  SpO2: 100% 96% 96% 95%  Weight:      Height:         Author: Lynden Oxford, MD 12/03/2022 5:46 PM  Please look on [email or web address] to find out who is on call.

## 2022-12-03 NOTE — Anesthesia Postprocedure Evaluation (Signed)
Anesthesia Post Note  Patient: Judith Stevens  Procedure(s) Performed: INTRAMEDULLARY (IM) NAIL INTERTROCHANTERIC (Right: Hip)     Patient location during evaluation: PACU Anesthesia Type: MAC and Spinal Level of consciousness: oriented and awake and alert Pain management: pain level controlled Vital Signs Assessment: post-procedure vital signs reviewed and stable Respiratory status: spontaneous breathing, respiratory function stable and patient connected to nasal cannula oxygen Cardiovascular status: blood pressure returned to baseline and stable Postop Assessment: no headache, no backache and no apparent nausea or vomiting Anesthetic complications: no   No notable events documented.  Last Vitals:  Vitals:   12/03/22 0826 12/03/22 1232  BP: (!) 126/54 114/70  Pulse: 64 63  Resp: 18 18  Temp: 36.9 C 36.8 C  SpO2: 100% 96%    Last Pain:  Vitals:   12/03/22 1232  TempSrc: Oral  PainSc:                  Shanard Treto S

## 2022-12-03 NOTE — Evaluation (Addendum)
Physical Therapy Evaluation Patient Details Name: Judith Stevens MRN: [id number] DOB: [birth year] Today's Date: 12/03/2022  History of Present Illness  82  yo female presents to therapy s/p mechanical fall in home setting on 12/01/2022 resulting in R  displaced intertochanteric femoral fx. Pt underwent R IM nail on 12/02/2022. Pt is currently R LE WBAT. Pt has PMH including but not limited to: arthritis, asthma, aortic valve stenosis, CAD, COPD, GERD, HLD, HTN, PE, back surgery and SI joint fusion.  Clinical Impression     Pt admitted with above diagnosis.  Pt currently with functional limitations due to the deficits listed below (see PT Problem List). Pt is POD 1 s/p R IM nail and exhibits apparent confusion and new onset of vertigo like S and S warranting further assessment. No reports of dizziness with rolling or bed mobility tasks. PT in room when MD entered and made PT aware of concern per downward trending HgB (7.6 and 7.2 in pm) and stat orders for labs and recommendation for limited mobility assessment. Pt exhibited apparent confusion and difficulty following commands requiring increased time for processing. Pt required max A for supine <> sit with multimodal cues. Total A to reposition in bed. Pt left in bed all needs met and set up for lunch. PT communicated with nursing staff and recommends use of lift requirement if pt is required to get OOB and bed pan at this time. Patient will benefit from continued inpatient follow up therapy, <3 hours/day at time of d/c.  Pt will benefit from acute skilled PT to increase their independence and safety with mobility to allow discharge.         If plan is discharge home, recommend the following: Two people to help with walking and/or transfers;Two people to help with bathing/dressing/bathroom;Assistance with cooking/housework;Assist for transportation;Help with stairs or ramp for entrance;Supervision due to cognitive status   Can travel by private  vehicle        Equipment Recommendations Rolling walker (2 wheels)  Recommendations for Other Services       Functional Status Assessment Patient has had a recent decline in their functional status and demonstrates the ability to make significant improvements in function in a reasonable and predictable amount of time.     Precautions / Restrictions Precautions Precautions: Fall Restrictions Weight Bearing Restrictions: Yes RLE Weight Bearing: Weight bearing as tolerated      Mobility  Bed Mobility Overal bed mobility: Needs Assistance Bed Mobility: Supine to Sit, Sit to Supine     Supine to sit: Max assist, HOB elevated, Used rails Sit to supine: Max assist, Used rails   General bed mobility comments: pt demonstrated minimal ability to [employer] R LE toward EOB, max A for LE, mod A for trunk with HOB elevated, max A to scoot in sitting to EOB pt stating wait wait, pt required max A for trunk and LEs for sit to supine and total assist to reposition toward [facility]    Transfers                   General transfer comment: NT per MD recommendation    Ambulation/Gait               General Gait Details: NT per MD recommendation  Stairs            Wheelchair Mobility     Tilt Bed    Modified Rankin (Stroke Patients Only)       Balance Overall balance assessment: Needs assistance,  History of Falls Sitting-balance support: Bilateral upper extremity supported, Feet supported Sitting balance-Leahy Scale: Poor Sitting balance - Comments: strong posterior lean with pt offloading R hip in sitting with L lateral lean and CGA to min A to maintain                                     Pertinent Vitals/Pain Pain Assessment Pain Assessment: Faces Faces Pain Scale: Hurts even more Pain Location: R LE Pain Descriptors / Indicators: Constant, Discomfort, Aching, Guarding, Grimacing, Moaning, Operative site guarding Pain Intervention(s): Limited  activity within patient's tolerance, Monitored during session, Premedicated before session, Repositioned, Ice applied    Home Living Family/patient expects to be discharged to:: Private residence Living Arrangements: Alone Available Help at Discharge: Family Type of Home: Mobile home Home Access: Stairs to enter Entrance Stairs-Rails: None Entrance Stairs-Number of Steps: 3   Home Layout: One level Home Equipment: Rollator (4 wheels);Cane - single point Additional Comments: pt states duaghter is going to help out at time of d/c, no family present at time of eval and pt exhibited apparent confusion uncelar as to accuracy of PLOF    Prior Function Prior Level of Function : Independent/Modified Independent             Mobility Comments: mod I with ADLs, self care tasks and IADLs with use of SPC ADLs Comments: pt exhibits confusion today     Extremity/Trunk Assessment        Lower Extremity Assessment Lower Extremity Assessment: RLE deficits/detail RLE Deficits / Details: ankle DF/PF 5/5 noted weakness with quad and hip flexors RLE Sensation: WNL    Cervical / Trunk Assessment Cervical / Trunk Assessment: Back Surgery  Communication   Communication Communication: Difficulty following commands/understanding;Difficulty communicating thoughts/reduced clarity of speech Cueing Techniques: Verbal cues;Gestural cues;Tactile cues;Visual cues  Cognition Arousal: Alert Behavior During Therapy: WFL for tasks assessed/performed Overall Cognitive Status: Impaired/Different from baseline Area of Impairment: Orientation, Memory, Following commands, Safety/judgement, Awareness                 Orientation Level: Person, Place   Memory: Decreased recall of precautions, Decreased short-term memory Following Commands: Follows one step commands consistently Safety/Judgement: Decreased awareness of safety, Decreased awareness of deficits     General Comments: pt exhibits apparent  confusion pt indicated she was not in the correct room was able to recall room number but did not believe this therapist that pt was in the same room. pt indicated that her surgery was in April and that she was in a "line up" yesterday at 11 am and again at 2 pm. no family present at time of evaluation, MD present during a portion of eval. MD recommended limited mobilization today due to downward trending of HgB and stat labs ordered with labs drawn during PT eval.        General Comments      Exercises     Assessment/Plan    PT Assessment Patient needs continued PT services  PT Problem List Decreased strength;Decreased range of motion;Decreased activity tolerance;Decreased balance;Decreased mobility;Decreased coordination;Decreased cognition;Pain       PT Treatment Interventions DME instruction;Gait training;Stair training;Functional mobility training;Therapeutic activities;Therapeutic exercise;Balance training;Neuromuscular re-education;Cognitive remediation;Patient/family education;Modalities    PT Goals (Current goals can be found in the Care Plan section)  Acute Rehab PT Goals Patient Stated Goal: to go home tomorrow PT Goal Formulation: With patient Time For Goal Achievement: 12/17/22 Potential  to Achieve Goals: Good    Frequency Min 1X/week     Co-evaluation               AM-PAC PT "6 Clicks" Mobility  Outcome Measure Help needed turning from your back to your side while in a flat bed without using bedrails?: A Lot Help needed moving from lying on your back to sitting on the side of a flat bed without using bedrails?: A Lot Help needed moving to and from a bed to a chair (including a wheelchair)?: A Lot Help needed standing up from a chair using your arms (e.g., wheelchair or bedside chair)?: Total Help needed to walk in hospital room?: Total Help needed climbing 3-5 steps with a railing? : Total 6 Click Score: 9    End of Session   Activity Tolerance: Patient  limited by pain;Patient limited by lethargy Patient left: in bed;with call bell/phone within reach;with bed alarm set Nurse Communication: Mobility status;Need for lift equipment PT Visit Diagnosis: Unsteadiness on feet (R26.81);Other abnormalities of gait and mobility (R26.89);Muscle weakness (generalized) (M62.81);Difficulty in walking, not elsewhere classified (R26.2);Pain Pain - Right/Left: Right Pain - part of body: Leg;Knee    Time: 1610-9604 PT Time Calculation (min) (ACUTE ONLY): 34 min   Charges:   PT Evaluation $PT Eval Low Complexity: 1 Low PT Treatments $Therapeutic Activity: 8-22 mins PT General Charges $$ ACUTE PT VISIT: 1 Visit         Johnny Bridge, PT Acute Rehab   Jacqualyn Posey 12/03/2022, 2:35 PM

## 2022-12-03 NOTE — TOC Initial Note (Signed)
Transition of Care [facility]) - Initial/Assessment Note    Patient Details  Name: Judith Stevens MRN: [id number] Date of Birth: 01-18-1941  Transition of Care [facility]) CM/SW Contact:    Harriett Sine, RN Phone Number:[phone]  12/03/2022, 11:39 AM  Clinical Narrative:                  Pt has no TOC or SDOH needs at this time , following.  Expected Discharge Plan: Skilled Nursing Facility Barriers to Discharge: No Barriers Identified   Patient Goals and CMS Choice Patient states their goals for this hospitalization and ongoing recovery are:: none stated CMS Medicare.gov Compare Post Acute Care list provided to:: Patient Choice offered to / list presented to : Patient [place] ownership interest in [facility].provided to:: Patient    Expected Discharge Plan and Services       Living arrangements for the past 2 months: Single Family Home                                      Prior Living Arrangements/Services Living arrangements for the past 2 months: Single Family Home Lives with:: Adult Children, Self Patient language and need for interpreter reviewed:: Yes Do you feel safe going back to the place where you live?: Yes      Need for Family Participation in Patient Care: Yes (Comment) Care giver support system in place?: Yes (comment) (daughters Terri and Customer service manager)   Criminal Activity/Legal Involvement Pertinent to Current Situation/Hospitalization: No - Comment as needed  Activities of Daily Living Home Assistive Devices/Equipment: Environmental consultant (specify type), Cane (specify quad or straight), Eyeglasses, Wheelchair ADL Screening (condition at time of admission) Patient's cognitive ability adequate to safely complete daily activities?: Yes Is the patient deaf or have difficulty hearing?: No Does the patient have difficulty seeing, even when wearing glasses/contacts?: No Does the patient have difficulty concentrating, remembering, or making decisions?:  Yes Patient able to express need for assistance with ADLs?: No Does the patient have difficulty dressing or bathing?: No Independently performs ADLs?: No Communication: Independent Dressing (OT): Independent Grooming: Independent Feeding: Independent Bathing: Independent Toileting: Independent In/Out Bed: Independent Walks in Home: Independent with device (comment) (cane and walker) Does the patient have difficulty walking or climbing stairs?: Yes Weakness of Legs: Left Weakness of Arms/Hands: None  Permission Sought/Granted Permission sought to share information with : Family Supports, Oceanographer granted to share information with : Yes, Verbal Permission Granted              Emotional Assessment Appearance:: Appears stated age Attitude/Demeanor/Rapport: Other (comment) (appropriate) Affect (typically observed): Accepting, Calm Orientation: : Oriented to Self, Oriented to Situation, Oriented to  Time, Oriented to Place Alcohol / Substance Use: Alcohol Use, Other (comment) (quit smoking tobacco 3 years ago) Psych Involvement: No (comment)  Admission diagnosis:  Closed right hip fracture (HCC) [S72.001A] Fall, initial encounter [W19.XXXA] Closed fracture of right hip, initial encounter [facility]) [S72.001A] Patient Active Problem List   Diagnosis Date Noted   Closed right hip fracture (HCC) 12/01/2022   Anxiety and depression 01/23/2021   Unstable angina (HCC) 04/17/2019   Other allergic rhinitis 08/18/2016   History of food allergy 08/18/2016   Rhinitis medicamentosa 08/18/2016   COPD (chronic obstructive pulmonary disease) (HCC) 08/18/2016   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 [place] [address] Phone: [phone] Fax: [phone]  [facility] - [place], [place] - [address] [place] [phone] Phone: [phone] Fax: [phone]     Social Determinants of Health (SDOH) Social History: SDOH Screenings   Food Insecurity: No Food Insecurity (12/01/2022)  Housing: Low Risk  (12/01/2022)  Transportation Needs: No Transportation Needs (12/01/2022)  Utilities: Not At Risk (12/01/2022)  Tobacco Use: Medium Risk (12/02/2022)   SDOH Interventions:     Readmission Risk Interventions     No data to display

## 2022-12-03 NOTE — Progress Notes (Signed)
     Subjective: Patient reports pain is moderate this morning.  Daughter at bedside.  Patient slept poorly overnight.  Has also not been eating or drinking well since surgery.  Encouraged her to try to get some rest today.  Will order melatonin for tonight.  Plan to work with physical therapy today.  Per daughter, patient has been confused.  Hemoglobin is 7.6 this morning.  Objective:   VITALS:   Vitals:   12/02/22 2012 12/02/22 2148 12/03/22 0018 12/03/22 0404  BP:  (!) 109/48 (!) 115/53 (!) 125/59  Pulse:  63 68 65  Resp:  16 16 16   Temp:  98 F (36.7 C) 98.6 F (37 C) 98.7 F (37.1 C)  TempSrc:  Oral    SpO2: 92% 95% 96% 95%  Weight:      Height:        Neurovascular intact Sensation intact distally Intact pulses distally Dorsiflexion/Plantar flexion intact Incision: dressing C/D/I Compartment soft    Lab Results  Component Value Date   WBC 9.7 12/03/2022   HGB 7.6 (L) 12/03/2022   HCT 23.3 (L) 12/03/2022   MCV 96.3 12/03/2022   PLT 116 (L) 12/03/2022   BMET    Component Value Date/Time   NA 133 (L) 12/03/2022 0331   NA 141 06/25/2021 0851   K 4.8 12/03/2022 0331   CL 100 12/03/2022 0331   CO2 23 12/03/2022 0331   GLUCOSE 114 (H) 12/03/2022 0331   BUN 39 (H) 12/03/2022 0331   BUN 9 06/25/2021 0851   CREATININE 1.54 (H) 12/03/2022 0331   CREATININE 0.52 (L) 04/27/2016 0923   CALCIUM 8.2 (L) 12/03/2022 0331   EGFR 91 06/25/2021 0851   GFRNONAA 34 (L) 12/03/2022 0331   GFRNONAA >89 01/24/2016 1127    Xray: PACU x-rays demonstrate well reduced fracture, hardware intact, no adverse features.  Assessment/Plan: 1 Day Post-Op   Principal Problem:   Closed right hip fracture (HCC)  Status post right intertrochanteric femur fracture ORIF with cephalomedullary nail 12/02/22  Post op recs: WB: WBAT RLE Abx: Vanco and gentamicin given perioperative  Imaging: PACU xrays Dressing: keep intact until follow up, change PRN if soiled or saturated. DVT  prophylaxis: lovenox starting POD1, consider discharging on Xarelto or Eliquis prophylaxis dose postop Follow up: 2 weeks after surgery for a wound check with Dr. Blan[facility]essert at [facility].  Address: [address], [place], [place] [address]  Office Phone: [phone]    Joen Laura 12/03/2022, 8:12 AM   Weber Cooks, MD  Contact information:   [phone] 7am-5pm epic message Dr. Blanchie Dessert, or call office for patient follow up: [phone] After hours and holidays please check [email or web address] for group call information for [facility]

## 2022-12-04 ENCOUNTER — Inpatient Hospital Stay (HOSPITAL_COMMUNITY): Payer: Medicare Other

## 2022-12-04 DIAGNOSIS — S72001A Fracture of unspecified part of neck of right femur, initial encounter for closed fracture: Secondary | ICD-10-CM | POA: Diagnosis not present

## 2022-12-04 LAB — SODIUM, URINE, RANDOM: Sodium, Ur: <10 mmol/L

## 2022-12-04 LAB — BASIC METABOLIC PANEL
Anion gap: 9 (ref 5–15)
BUN: 49 mg/dL — ABNORMAL HIGH (ref 8–23)
CO2: 23 mmol/L (ref 22–32)
Calcium: 7.8 mg/dL — ABNORMAL LOW (ref 8.9–10.3)
Chloride: 97 mmol/L — ABNORMAL LOW (ref 98–111)
Creatinine, Ser: 2.13 mg/dL — ABNORMAL HIGH (ref 0.44–1.00)
GFR, Estimated: 23 mL/min — ABNORMAL LOW (ref >60)
Glucose, Bld: 116 mg/dL — ABNORMAL HIGH (ref 70–99)
Potassium: 4.6 mmol/L (ref 3.5–5.1)
Sodium: 129 mmol/L — ABNORMAL LOW (ref 135–145)

## 2022-12-04 LAB — TYPE AND SCREEN
ABO/RH(D): A POS
Antibody Screen: NEGATIVE
Unit division: 0

## 2022-12-04 LAB — URINALYSIS, ROUTINE W REFLEX MICROSCOPIC
Bilirubin Urine: NEGATIVE
Glucose, UA: NEGATIVE mg/dL
Ketones, ur: NEGATIVE mg/dL
Leukocytes,Ua: NEGATIVE
Nitrite: NEGATIVE
Protein, ur: NEGATIVE mg/dL
Specific Gravity, Urine: 1.008 (ref 1.005–1.030)
pH: 5 (ref 5.0–8.0)

## 2022-12-04 LAB — TECHNOLOGIST SMEAR REVIEW

## 2022-12-04 LAB — OSMOLALITY, URINE: Osmolality, Ur: 262 mosm/kg — ABNORMAL LOW (ref 300–900)

## 2022-12-04 LAB — DIFFERENTIAL
Abs Immature Granulocytes: 0.09 10*3/uL — ABNORMAL HIGH (ref 0.00–0.07)
Basophils Absolute: 0 10*3/uL (ref 0.0–0.1)
Basophils Relative: 0 %
Eosinophils Absolute: 0 10*3/uL (ref 0.0–0.5)
Eosinophils Relative: 0 %
Immature Granulocytes: 1 %
Lymphocytes Relative: 13 %
Lymphs Abs: 1.2 10*3/uL (ref 0.7–4.0)
Monocytes Absolute: 1.1 10*3/uL — ABNORMAL HIGH (ref 0.1–1.0)
Monocytes Relative: 12 %
Neutro Abs: 6.9 10*3/uL (ref 1.7–7.7)
Neutrophils Relative %: 74 %

## 2022-12-04 LAB — MAGNESIUM: Magnesium: 2 mg/dL (ref 1.7–2.4)

## 2022-12-04 LAB — CBC
HCT: 24.8 % — ABNORMAL LOW (ref 36.0–46.0)
Hemoglobin: 8.2 g/dL — ABNORMAL LOW (ref 12.0–15.0)
MCH: 30.3 pg (ref 26.0–34.0)
MCHC: 33.1 g/dL (ref 30.0–36.0)
MCV: 91.5 fL (ref 80.0–100.0)
Platelets: 90 10*3/uL — ABNORMAL LOW (ref 150–400)
RBC: 2.71 MIL/uL — ABNORMAL LOW (ref 3.87–5.11)
RDW: 15.2 % (ref 11.5–15.5)
WBC: 9.3 10*3/uL (ref 4.0–10.5)
nRBC: 0 % (ref 0.0–0.2)

## 2022-12-04 LAB — BPAM RBC
Blood Product Expiration Date: 202409262359
ISSUE DATE / TIME: 202409051700
Unit Type and Rh: 6200

## 2022-12-04 LAB — CK: Total CK: 489 U/L — ABNORMAL HIGH (ref 38–234)

## 2022-12-04 LAB — CREATININE, URINE, RANDOM: Creatinine, Urine: 57 mg/dL

## 2022-12-04 MED ORDER — HEPARIN SODIUM (PORCINE) 5000 UNIT/ML IJ SOLN: 5000.0000 [IU] | Freq: Three times a day (TID) | INTRAMUSCULAR | Status: DC

## 2022-12-04 MED ORDER — APIXABAN 2.5 MG PO TABS: 2.5000 mg | ORAL_TABLET | Freq: Two times a day (BID) | ORAL | 0 refills | Status: AC

## 2022-12-04 MED ORDER — HYDROCODONE-ACETAMINOPHEN 5-325 MG PO TABS: 0.5000 | ORAL_TABLET | ORAL | 0 refills | Status: AC | PRN

## 2022-12-04 NOTE — Progress Notes (Signed)
    2 Days Post-Op Procedure(s) (LRB): INTRAMEDULLARY (IM) NAIL INTERTROCHANTERIC (Right)  Subjective: Patient reports pain is mild-moderate this morning.  Patient slept better overnight.  Not been eating or drinking well since surgery.  Encouraged her to try to get some rest today.  Plan to work with physical therapy today.  Hemoglobin is 8.2 s/p transfusion 1 unit PRBC.  Objective:   VITALS:   Vitals:   12/03/22 1949 12/03/22 2015 12/03/22 2234 12/04/22 0111  BP: (!) 133/43 (!) 128/44 110/83 (!) 121/44  Pulse: 65 63 (!) 58 (!) 59  Resp: 20 20 17 17   Temp: 99.2 F (37.3 C) 99.2 F (37.3 C) 99.2 F (37.3 C) 99 F (37.2 C)  TempSrc:  Oral Oral Oral  SpO2: 97% 96% 93% 93%  Weight:      Height:        AAOx4, resting comfortably in bed, answering questions appropriately Neurovascular intact Sensation intact distally Intact pulses distally Dorsiflexion/Plantar flexion intact Incision: dressing C/D/I Compartment soft Wiggles toes    Lab Results  Component Value Date   WBC 9.3 12/04/2022   HGB 8.2 (L) 12/04/2022   HCT 24.8 (L) 12/04/2022   MCV 91.5 12/04/2022   PLT 90 (L) 12/04/2022   BMET    Component Value Date/Time   NA 129 (L) 12/04/2022 0333   NA 141 06/25/2021 0851   K 4.6 12/04/2022 0333   CL 97 (L) 12/04/2022 0333   CO2 23 12/04/2022 0333   GLUCOSE 116 (H) 12/04/2022 0333   BUN 49 (H) 12/04/2022 0333   BUN 9 06/25/2021 0851   CREATININE 2.13 (H) 12/04/2022 0333   CREATININE 0.52 (L) 04/27/2016 0923   CALCIUM 7.8 (L) 12/04/2022 0333   EGFR 91 06/25/2021 0851   GFRNONAA 23 (L) 12/04/2022 0333   GFRNONAA >89 01/24/2016 1127    Xray: PACU x-rays demonstrate well reduced fracture, hardware intact, no adverse features.  Assessment/Plan: 2 Days Post-Op   Principal Problem:   Closed right hip fracture (HCC)  Status post right intertrochanteric femur fracture ORIF with cephalomedullary nail 12/02/22  9/6: Mild acute anemia, likely d/t blood loss.   Received transfusion 1 unit PRBC yesterday.  Hgb improved to 8.2 today (yesterday 7.2).  Post op recs: WB: WBAT RLE Abx: Vanco and gentamicin given perioperative  Imaging: PACU xrays Dressing: keep intact until follow up, change PRN if soiled or saturated. DVT prophylaxis: lovenox starting POD1, consider discharging on Xarelto or Eliquis prophylaxis dose postop DC: per medicine, likely SNF Follow up: 2 weeks after surgery for a wound check with Dr. Blanchie Dessert at [facility].  Address: [address], [place], [place] [address]  Office Phone: [phone]    Cecil Cobbs 12/04/2022, 6:25 AM   Weber Cooks, MD  Contact information:   [phone] 7am-5pm epic message Dr. Blanchie Dessert, or call office for patient follow up: [phone] After hours and holidays please check [email or web address] for group call information for [facility]

## 2022-12-04 NOTE — Progress Notes (Signed)
[facility] Progress Note Patient: Judith Stevens KZS:[id number] DOB: October 01, 1940 DOA: 12/01/2022  DOS: the patient was seen and examined on 12/04/2022  Brief hospital course: PMH of CAD, COPD, HLD, former smoker presented to the hospital with complaints of right hip fracture after mechanical fall. Although she reports symptoms of vertigo intermittently whenever she is moving her head.  Assessment and Plan: Right acute mildly displaced proximal femoral intertrochanteric fracture. Orthopedic consulted. Underwent intramedullary nail implant. Pain management and DVT prophylaxis per orthopedic. Monitor. Weightbearing per orthopedic as well. PT OT consulted.  Unwitnessed fall. Suspected BPPV. Will request vestibular evaluation as well. On my exam no significant nystagmus identified.  Acute kidney injury. Mild hyponatremia. Likely ischemic injury in the setting of hemodynamic insult from hypotension intraoperatively and perioperatively. Patient required pressors after the surgery. Blood pressure improving. FENa 0.3 %, suggesting prerenal etiology.  Urine sodium less than 10. Currently holding all antihypertensive medication. Continue with IV hydration. Ultrasound renal as well.  Acute normocytic anemia Acute thrombocytopenia. Expected acute blood loss anemia. Dilutional anemia. Patient baseline hemoglobin was around 12-13. On presentation hemoglobin around 11. Dropped down to 10 preoperatively and currently around 7 postoperatively. Received 1 PRBC 9/6. Repeat H&H relatively stable.  Mild swelling at the surgical site but no significant worsening ongoing hematoma. No evidence of ongoing bleeding.  Monitor. Platelet count significantly low as well.  Likely dilutional component there as well.  Monitor. Holding DVT prophylaxis for now.  Acute delirium. Resolved. Likely in the setting of recent anesthesia use, pain medication use as well as acute kidney injury and  hypotension. Monitor.  No focal deficit.  CAD. Remote history. Currently no angina symptoms. Underwent surgery without any complication. Monitor  HTN. Patient is on bisoprolol, Cardizem, HCTZ, Imdur, Benicar. Her blood pressure is actually rather soft right now. Will be holding these medications and monitor.  GERD. Continue PPI.  Mood disorder. Reportedly patient is supposed to be on Zoloft in the past and Lexapro right now although unable to verify from the pharmacy sources about presence of this medication. Both have been discontinued. Continue as needed Xanax.  Confirm from PDMP review that the patient is taking this medication.  HLD. Continue statin.   Subjective: Mentation better.  No nausea no vomiting no fever no chills.  Oral intake improving.  Small BM.  Physical Exam: General: in Mild distress, No Rash Cardiovascular: S1 and S2 Present, No Murmur Respiratory: Good respiratory effort, Bilateral Air entry present. No Crackles, No wheezes Abdomen: Bowel Sound present, No tenderness Extremities: Surgical site right thigh small hematoma and expected edema Neuro: Alert and oriented x3, no new focal deficit  Data Reviewed: I have Reviewed nursing notes, Vitals, and Lab results. Since last encounter, pertinent lab results CBC and BMP   . I have ordered test including CBC and BMP, ultrasound renal  .   Disposition: Status is: Inpatient Remains inpatient appropriate because: Needing further workup  SCDs Start: 12/01/22 1732   Family Communication: Daughter at bedside Level of care: Med-Surg   Vitals:   12/04/22 0601 12/04/22 0753 12/04/22 0818 12/04/22 1200  BP: (!) 130/50  (!) 140/51 135/89  Pulse: 64  (!) 57 67  Resp: 19  18 16   Temp: 98.3 F (36.8 C)  98.5 F (36.9 C) 98.3 F (36.8 C)  TempSrc: Oral  Oral Oral  SpO2: 98% 98% 98% 97%  Weight:      Height:         Author: Lynden Oxford, MD 12/04/2022 3:26 PM  Please look on [email or web address] to find out  who is on call.

## 2022-12-04 NOTE — NC FL2 (Signed)
MEDICAID FL2 LEVEL OF CARE FORM     IDENTIFICATION  Patient Name: SKYLEY WI Birthdate: 1940-11-08 Sex: female Admission Date (Current Location): 12/01/2022  [facility] and [place] Number:  Producer, television/film/video and Address:  [facility],  [address] [place], [place] [address]      Provider Number: [id number]  Attending Physician Name and Address:  Rolly Salter, MD  Relative Name and Phone Number:  daughter, Jeanella Anton @ [phone]    Current Level of Care: Hospital Recommended Level of Care: Skilled Nursing Facility Prior Approval Number:    Date Approved/Denied:   PASRR Number: [id number]  Discharge Plan: SNF    Current Diagnoses: Patient Active Problem List   Diagnosis Date Noted   Closed right hip fracture (HCC) 12/01/2022   Anxiety and depression 01/23/2021   Unstable angina (HCC) 04/17/2019   Other allergic rhinitis 08/18/2016   History of food allergy 08/18/2016   Rhinitis medicamentosa 08/18/2016   COPD (chronic obstructive pulmonary disease) (HCC) 08/18/2016   Aortic valve sclerosis 04/03/2015   Dyspnea 02/12/2015   Hyperlipidemia with target LDL less than 70 01/31/2014   Coronary artery disease 09/27/2012   Hypertension 09/27/2012   Hyperlipidemia 09/27/2012   Current smoker 09/27/2012   Chest pain at rest 09/27/2012    Orientation RESPIRATION BLADDER Height & Weight     Self, Time, Situation, Place  Normal Continent, External catheter (currently with purewick) Weight: 140 lb (63.5 kg) Height:  5\' 4"  (162.6 cm)  BEHAVIORAL SYMPTOMS/MOOD NEUROLOGICAL BOWEL NUTRITION STATUS      Continent Diet (regular)  AMBULATORY STATUS COMMUNICATION OF NEEDS Skin   Extensive Assist Verbally Other (Comment) (surgical incision only)                       Personal Care Assistance Level of Assistance  Bathing, Dressing Bathing Assistance: Limited assistance   Dressing Assistance: Limited assistance     Functional  Limitations Info  Sight, Hearing, Speech Sight Info: Adequate Hearing Info: Adequate Speech Info: Adequate    SPECIAL CARE FACTORS FREQUENCY  PT (By licensed PT), OT (By licensed OT)     PT Frequency: 5x/wk OT Frequency: 5x/wk            Contractures Contractures Info: Not present    Additional Factors Info  Code Status, Allergies Code Status Info: Full Allergies Info: Cefzil (Cefprozil), Clarithromycin, Egg Solids, Whole, Gluten Meal, Keflex (Cephalexin), Lactose Intolerance (Gi), Levaquin (Levofloxacin), Milk-related Compounds, Naproxen, Peanuts (Peanut Oil), Penicillins, Percocet (Oxycodone-acetaminophen), Prednisone, Red Dye #40 (Allura Red), Sulfa Antibiotics, Sulfacetamide Sodium, Tetracycline Hcl, Tetracyclines & Related, Wheat, Influenza Vaccines, Amoxicillin-pot Clavulanate, Ibuprofen, Methocarbamol, Moxifloxacin, Penicillin G, Sulfamethoxazole-trimethoprim, Tetracycline, Tramadol           Current Medications (12/04/2022):  This is the current hospital active medication list Current Facility-Administered Medications  Medication Dose Route Frequency Provider Last Rate Last Admin   0.9 %  sodium chloride infusion   Intravenous Continuous Rolly Salter, MD 100 mL/hr at 12/03/22 1454 New Bag at 12/03/22 1454   acetaminophen (TYLENOL) tablet 650 mg  650 mg Oral Q6H PRN Kathie Dike M, PA-C       albuterol (PROVENTIL) (2.5 MG/3ML) 0.083% nebulizer solution 2.5 mg  2.5 mg Nebulization Q2H PRN Kathie Dike M, PA-C       ALPRAZolam Prudy Feeler) tablet 1 mg  1 mg Oral TID PRN Cecil Cobbs, PA-C   1 mg at 12/04/22 0849   atorvastatin (LIPITOR) tablet 80 mg  80 mg Oral QHS Kathie Dike M, [place]   80 mg at 12/03/22 2031   fluticasone (FLONASE) 50 MCG/ACT nasal spray 1 spray  1 spray Each Nare Daily Cecil Cobbs, PA-C   1 spray at 12/03/22 1914   HYDROcodone-acetaminophen (NORCO/VICODIN) 5-325 MG per tablet 0.5-1 tablet  0.5-1 tablet Oral Q4H PRN Rolly Salter, MD   1 tablet at 12/04/22 7829   HYDROmorphone (DILAUDID) injection 0.5 mg  0.5 mg Intravenous Q4H PRN Cecil Cobbs, PA-C   0.5 mg at 12/03/22 0405   lip balm (CARMEX) ointment   Topical PRN Cecil Cobbs, PA-C   Given at 12/01/22 1630   melatonin tablet 3 mg  3 mg Oral QHS Joen Laura, MD   3 mg at 12/03/22 2031   mometasone-formoterol (DULERA) 200-5 MCG/ACT inhaler 2 puff  2 puff Inhalation BID Cecil Cobbs, PA-C   2 puff at 12/04/22 0753   ondansetron (ZOFRAN) tablet 4 mg  4 mg Oral Q6H PRN Cecil Cobbs, PA-C       Or   ondansetron [facility]) injection 4 mg  4 mg Intravenous Q6H PRN Kathie Dike M, PA-C   4 mg at 12/03/22 1659   pantoprazole (PROTONIX) EC tablet 40 mg  40 mg Oral BID Kathie Dike M, PA-C   40 mg at 12/04/22 5621     Discharge Medications: Please see discharge summary for a list of discharge medications.  Relevant Imaging Results:  Relevant Lab Results:   Additional Information SS# [id number]  Efe Fazzino, LCSW

## 2022-12-04 NOTE — TOC Progression Note (Signed)
Transition of Care [facility]) - Progression Note    Patient Details  Name: ANJELAH ANDRING MRN: [id number] Date of Birth: Oct 18, 1940  [facility]) CM/SW Contact  Amada Jupiter, LCSW Phone Number: 12/04/2022, 12:03 PM  Clinical Narrative:     Met with pt and daughter this morning to review dc planning needs.  Discussed options of home with HH (if pt has 24/7 support) vs. SNF for rehab.  Both feel that short term SNF would be best/ safest option and are agreed with CSW to begin bed search.    Expected Discharge Plan: Skilled Nursing Facility Barriers to Discharge: No Barriers Identified  Expected Discharge Plan and Services       Living arrangements for the past 2 months: Single Family Home                                       Social Determinants of Health (SDOH) Interventions SDOH Screenings   Food Insecurity: No Food Insecurity (12/01/2022)  Housing: Low Risk  (12/01/2022)  Transportation Needs: No Transportation Needs (12/01/2022)  Utilities: Not At Risk (12/01/2022)  Tobacco Use: Medium Risk (12/02/2022)    Readmission Risk Interventions    12/04/2022   12:03 PM  Readmission Risk Prevention Plan  Transportation Screening Complete  PCP or Specialist Appt within 5-7 Days Complete  Home Care Screening Complete  Medication Review (RN CM) Complete

## 2022-12-04 NOTE — Progress Notes (Signed)
Physical Therapy Treatment Patient Details Name: Judith Stevens MRN: [id number] DOB: 1940/05/30 Today's Date: 12/04/2022   History of Present Illness 82  yo female presents to therapy s/p mechanical fall in home setting on 12/01/2022 resulting in R  displaced intertochanteric femoral fx. Pt underwent R IM nail on 12/02/2022. Pt is currently R LE WBAT. Pt has PMH including but not limited to: arthritis, asthma, aortic valve stenosis, CAD, COPD, GERD, HLD, HTN, PE, back surgery and SI joint fusion.    PT Comments  Pt reports she just got back in bed after transferring bed to bedside commode. She stated her pain is too severe (8/10) to attempt any further mobility at this time. She did perform a few RLE exercises with assist, she only tolerated ~10* of R hip flexion. Noted order for vestibular eval, pt reports she's not had vertigo for the past 2 weeks. She did have vertigo after a fall this past spring, especially when rolling to the L in bed, but denies symptoms for the past 2 weeks. No nystagmus noted at rest, nor with visual tracking L and R. Patient will benefit from continued inpatient follow up therapy, <3 hours/day.      If plan is discharge home, recommend the following: Two people to help with walking and/or transfers;Two people to help with bathing/dressing/bathroom;Assistance with cooking/housework;Assist for transportation;Help with stairs or ramp for entrance;Supervision due to cognitive status   Can travel by private vehicle     No  Equipment Recommendations  Rolling walker (2 wheels)    Recommendations for Other Services       Precautions / Restrictions Precautions Precautions: Fall Precaution Comments: h/o vertigo Restrictions Weight Bearing Restrictions: Yes RLE Weight Bearing: Weight bearing as tolerated     Mobility  Bed Mobility               General bed mobility comments: pt refused 2* 8/10 pain    Transfers                   General transfer  comment: pt reports she recently got up to a bedside commode then back in bed and is now in too much pain to attempt further mobility    Ambulation/Gait                   Stairs             Wheelchair Mobility     Tilt Bed    Modified Rankin (Stroke Patients Only)       Balance                                            Cognition Arousal: Alert Behavior During Therapy: WFL for tasks assessed/performed Overall Cognitive Status: Within Functional Limits for tasks assessed                                 General Comments: pleasant able to follow commands        Exercises General Exercises - Lower Extremity Ankle Circles/Pumps: AROM, Both, Supine, 15 reps Quad Sets: AROM, Both, 5 reps, Supine Heel Slides: AAROM, Right, 5 reps, Supine, Limitations Heel Slides Limitations: with gait belt as leg lifter Hip ABduction/ADduction: AAROM, Right, 5 reps, Supine, Limitations Hip Abduction/Adduction Limitations: with gait belt as leg lifter  General Comments        Pertinent Vitals/Pain Pain Assessment Pain Assessment: 0-10 Pain Score: 8  Pain Location: R hip Pain Descriptors / Indicators: Constant, Discomfort, Guarding, Grimacing, Operative site guarding Pain Intervention(s): Limited activity within patient's tolerance, Monitored during session, Premedicated before session, Ice applied    Home Living                          Prior Function            PT Goals (current goals can now be found in the care plan section) Acute Rehab PT Goals Patient Stated Goal: to go home tomorrow PT Goal Formulation: With patient Time For Goal Achievement: 12/17/22 Potential to Achieve Goals: Good Progress towards PT goals: Not progressing toward goals - comment (pain)    Frequency    Min 1X/week      PT Plan      Co-evaluation              AM-PAC PT "6 Clicks" Mobility   Outcome Measure  Help needed  turning from your back to your side while in a flat bed without using bedrails?: A Lot Help needed moving from lying on your back to sitting on the side of a flat bed without using bedrails?: A Lot Help needed moving to and from a bed to a chair (including a wheelchair)?: A Lot Help needed standing up from a chair using your arms (e.g., wheelchair or bedside chair)?: A Lot Help needed to walk in hospital room?: Total Help needed climbing 3-5 steps with a railing? : Total 6 Click Score: 10    End of Session   Activity Tolerance: Patient limited by pain Patient left: in bed;with call bell/phone within reach;with bed alarm set Nurse Communication: Mobility status Pain - Right/Left: Right Pain - part of body: Hip     Time: 7829-5621 PT Time Calculation (min) (ACUTE ONLY): 17 min  Charges:    $Therapeutic Exercise: 8-22 mins PT General Charges $$ ACUTE PT VISIT: 1 Visit                     Tamala Ser PT 12/04/2022  Acute Rehabilitation Services  Office [phone]

## 2022-12-05 DIAGNOSIS — S72001A Fracture of unspecified part of neck of right femur, initial encounter for closed fracture: Secondary | ICD-10-CM | POA: Diagnosis not present

## 2022-12-05 LAB — CBC
HCT: 23.4 % — ABNORMAL LOW (ref 36.0–46.0)
HCT: 23.9 % — ABNORMAL LOW (ref 36.0–46.0)
Hemoglobin: 7.5 g/dL — ABNORMAL LOW (ref 12.0–15.0)
Hemoglobin: 7.8 g/dL — ABNORMAL LOW (ref 12.0–15.0)
MCH: 30.6 pg (ref 26.0–34.0)
MCH: 31 pg (ref 26.0–34.0)
MCHC: 32.1 g/dL (ref 30.0–36.0)
MCHC: 32.6 g/dL (ref 30.0–36.0)
MCV: 95.5 fL (ref 80.0–100.0)
MCV: 94.8 fL (ref 80.0–100.0)
Platelets: 80 10*3/uL — ABNORMAL LOW (ref 150–400)
Platelets: 84 10*3/uL — ABNORMAL LOW (ref 150–400)
RBC: 2.45 MIL/uL — ABNORMAL LOW (ref 3.87–5.11)
RBC: 2.52 MIL/uL — ABNORMAL LOW (ref 3.87–5.11)
RDW: 14.9 % (ref 11.5–15.5)
RDW: 14.8 % (ref 11.5–15.5)
WBC: 7.8 10*3/uL (ref 4.0–10.5)
WBC: 8.2 10*3/uL (ref 4.0–10.5)
nRBC: 0 % (ref 0.0–0.2)
nRBC: 0 % (ref 0.0–0.2)

## 2022-12-05 LAB — BASIC METABOLIC PANEL
Anion gap: 6 (ref 5–15)
Anion gap: 7 (ref 5–15)
BUN: 45 mg/dL — ABNORMAL HIGH (ref 8–23)
BUN: 42 mg/dL — ABNORMAL HIGH (ref 8–23)
CO2: 23 mmol/L (ref 22–32)
CO2: 22 mmol/L (ref 22–32)
Calcium: 7.7 mg/dL — ABNORMAL LOW (ref 8.9–10.3)
Calcium: 7.5 mg/dL — ABNORMAL LOW (ref 8.9–10.3)
Chloride: 107 mmol/L (ref 98–111)
Chloride: 108 mmol/L (ref 98–111)
Creatinine, Ser: 2.08 mg/dL — ABNORMAL HIGH (ref 0.44–1.00)
Creatinine, Ser: 1.93 mg/dL — ABNORMAL HIGH (ref 0.44–1.00)
GFR, Estimated: 24 mL/min — ABNORMAL LOW (ref >60)
GFR, Estimated: 26 mL/min — ABNORMAL LOW (ref >60)
Glucose, Bld: 120 mg/dL — ABNORMAL HIGH (ref 70–99)
Glucose, Bld: 123 mg/dL — ABNORMAL HIGH (ref 70–99)
Potassium: 4 mmol/L (ref 3.5–5.1)
Potassium: 4.3 mmol/L (ref 3.5–5.1)
Sodium: 136 mmol/L (ref 135–145)
Sodium: 137 mmol/L (ref 135–145)

## 2022-12-05 LAB — UREA NITROGEN, URINE: Urea Nitrogen, Ur: 461 mg/dL

## 2022-12-05 LAB — MAGNESIUM: Magnesium: 2.1 mg/dL (ref 1.7–2.4)

## 2022-12-05 MED ORDER — FLUTICASONE-SALMETEROL 250-50 MCG/ACT IN AEPB
1.0000 | INHALATION_SPRAY | Freq: Two times a day (BID) | RESPIRATORY_TRACT | Status: DC
  Administered 2022-12-06 – 2022-12-07 (×2): 1 via RESPIRATORY_TRACT

## 2022-12-05 NOTE — Progress Notes (Addendum)
Physical Therapy Treatment Patient Details Name: Judith Stevens MRN: [id number] DOB: [birth year] Today's Date: 12/05/2022   History of Present Illness 82  yo female presents to therapy s/p mechanical fall in home setting on 12/01/2022 resulting in R  displaced intertochanteric femoral fx. Pt underwent R IM nail on 12/02/2022. Pt is currently R LE WBAT. Pt has PMH including but not limited to: arthritis, asthma, aortic valve stenosis, CAD, COPD, GERD, HLD, HTN, PE, back surgery and SI joint fusion.    PT Comments  Pt assisted with transfer to [facility].  Pt also initiated gait training taking a few steps along bed but fatigued quickly and reports increased pain as well as right knee feeling less stable (reports buckling earlier with nursing).  Pt's R leg more edematous then L leg so positioned bed with LEs elevated and provided ice packs to right hip and knee. Current d/c plan remains appropriate.  Patient will benefit from continued inpatient follow up therapy, <3 hours/day.  *In regards to pt's vertigo: she states she has not had any symptoms today.  Pt turned head left and right while in supine and denied any symptoms.  No nystagmus observed nor symptoms reported during mobilization during session.    If plan is discharge home, recommend the following: Assistance with cooking/housework;Assist for transportation;Help with stairs or ramp for entrance;Supervision due to cognitive status;Two people to help with walking and/or transfers;Two people to help with bathing/dressing/bathroom   Can travel by private vehicle     No  Equipment Recommendations  Rolling walker (2 wheels)    Recommendations for Other Services       Precautions / Restrictions Precautions Precautions: Fall Precaution Comments: h/o vertigo Restrictions RLE Weight Bearing: Weight bearing as tolerated     Mobility  Bed Mobility Overal bed mobility: Needs Assistance Bed Mobility: Supine to Sit, Sit to Supine     Supine to  sit: Max assist, HOB elevated, Used rails Sit to supine: Max assist, Used rails, HOB elevated   General bed mobility comments: multimodal cues for technique and sequencing, assist for Rt LE, trunk upright and scooting, assits for lower body and repositioning once returning to bed    Transfers Overall transfer level: Needs assistance Equipment used: Rolling walker (2 wheels) Transfers: Sit to/from Stand, Bed to chair/wheelchair/BSC Sit to Stand: Mod assist, +2 physical assistance, +2 safety/equipment Stand pivot transfers: Min assist, +2 safety/equipment         General transfer comment: verbal cues for UE and LE positioning, weight shifting; slow to mobilize due to pain    Ambulation/Gait Ambulation/Gait assistance: Mod assist, +2 physical assistance Gait Distance (Feet): 3 Feet Assistive device: Rolling walker (2 wheels) Gait Pattern/deviations: Step-to pattern, Decreased stance time - right, Antalgic       General Gait Details: encouraged to take a few step from [facility] up towards HOB, pt with difficulty weight shifting and advancing Rt LE, fatigued quickly so safely assisted with turning and controlled descent to bed   Stairs             Wheelchair Mobility     Tilt Bed    Modified Rankin (Stroke Patients Only)       Balance                                            Cognition Arousal: Alert Behavior During Therapy: [facility] for tasks assessed/performed  Overall Cognitive Status: Within Functional Limits for tasks assessed                                          Exercises      General Comments        Pertinent Vitals/Pain Pain Assessment Pain Assessment: 0-10 Pain Score: 6  Pain Location: R hip Pain Descriptors / Indicators: Discomfort, Guarding, Grimacing, Sore Pain Intervention(s): Repositioned, Monitored during session, Ice applied    Home Living                          Prior Function             PT Goals (current goals can now be found in the care plan section) Progress towards PT goals: Progressing toward goals    Frequency    Min 1X/week      PT Plan      Co-evaluation              AM-PAC PT "6 Clicks" Mobility   Outcome Measure  Help needed turning from your back to your side while in a flat bed without using bedrails?: A Lot Help needed moving from lying on your back to sitting on the side of a flat bed without using bedrails?: A Lot Help needed moving to and from a bed to a chair (including a wheelchair)?: A Lot Help needed standing up from a chair using your arms (e.g., wheelchair or bedside chair)?: A Lot Help needed to walk in hospital room?: A Lot Help needed climbing 3-5 steps with a railing? : Total 6 Click Score: 11    End of Session Equipment Utilized During Treatment: Gait belt Activity Tolerance: Patient limited by pain Patient left: in bed;with call bell/phone within reach;with bed alarm set Nurse Communication: Mobility status PT Visit Diagnosis: Difficulty in walking, not elsewhere classified (R26.2);Pain Pain - Right/Left: Right Pain - part of body: Hip     Time: 1411-1436 PT Time Calculation (min) (ACUTE ONLY): 25 min  Charges:    $Gait Training: 8-22 mins $Therapeutic Activity: 8-22 mins PT General Charges $$ ACUTE PT VISIT: 1 Visit                    Paulino Door, DPT Physical Therapist Acute Rehabilitation Services Office: [phone]    Kati L Payson 12/05/2022, 2:50 PM

## 2022-12-05 NOTE — Progress Notes (Signed)
[facility] Progress Note Patient: Judith Stevens WUX:[id number] DOB: June 22, 1940 DOA: 12/01/2022  DOS: the patient was seen and examined on 12/05/2022  Brief hospital course: PMH of CAD, COPD, HLD, former smoker presented to the hospital with complaints of right hip fracture after mechanical fall. Although she reports symptoms of vertigo intermittently whenever she is moving her head.  Assessment and Plan: Right acute mildly displaced proximal femoral intertrochanteric fracture. Orthopedic consulted. Underwent intramedullary nail implant. Pain management and DVT prophylaxis per orthopedic. Monitor. Weightbearing per orthopedic as well. PT OT consulted.  Unwitnessed fall. Suspected BPPV. Will request vestibular evaluation as well.  Continues to have no nystagmus.  Unable to perform vestibular evaluation given lack of symptoms.  Acute kidney injury. Mild hyponatremia. Likely ischemic injury in the setting of hemodynamic insult from hypotension intraoperatively and perioperatively. Patient required pressors after the surgery. Blood pressure improving. FENa 0.3 %, suggesting prerenal etiology.  Urine sodium less than 10. Currently holding all antihypertensive medication. Continue with IV hydration. Ultrasound renal unremarkable. Urine output more than 500 mL in last 24 hours. Serum creatinine appears to be plateauing.  Monitor for now.  Acute normocytic anemia Acute thrombocytopenia. Expected acute blood loss anemia. Dilutional anemia. Patient baseline hemoglobin was around 12-13. On presentation hemoglobin around 11. Dropped down to 10 preoperatively and currently around 7 postoperatively. Received 1 PRBC 9/6. Repeat H&H relatively stable.   Mild swelling at the surgical site but no significant worsening ongoing hematoma. No evidence of ongoing bleeding.  Platelet count significantly low as well.  Likely dilutional component there as well.  Monitor. Orthopedic started  the patient on Eliquis yesterday.  Monitor for bleeding. Monitor.  Transfuse for hemoglobin less than 7.  Acute delirium. Resolved. Likely in the setting of recent anesthesia use, pain medication use as well as acute kidney injury and hypotension. Monitor.  No focal deficit.  CAD. Remote history. Currently no angina symptoms. Underwent surgery without any complication. Monitor  HTN. Patient is on bisoprolol, Cardizem, HCTZ, Imdur, Benicar. Her blood pressure is actually rather soft right now. Will be holding these medications and monitor.  GERD. Continue PPI.  Mood disorder. Reportedly patient is supposed to be on Zoloft in the past and Lexapro right now although unable to verify from the pharmacy sources about presence of this medication. Both have been discontinued. Continue as needed Xanax.  Confirm from PDMP review that the patient is taking this medication.  HLD. Continue statin.   Subjective: No nausea no vomiting no fever no chills.  Refused to use hospital [facility].  Physical Exam: General: in Mild distress, No Rash Cardiovascular: S1 and S2 Present, No Murmur Respiratory: Good respiratory effort, Bilateral Air entry present. No Crackles, No wheezes Abdomen: Bowel Sound present, No tenderness Extremities: No edema Neuro: Alert and oriented x3, no new focal deficit  Data Reviewed: I have Reviewed nursing notes, Vitals, and Lab results. Since last encounter, pertinent lab results CBC and BMP   . I have ordered test including CBC and BMP  .   Disposition: Status is: Inpatient Remains inpatient appropriate because: Monitor renal function and hemoglobin  SCDs Start: 12/01/22 1732   Family Communication: Discussed with daughter on the phone Level of care: Med-Surg   Vitals:   12/05/22 0622 12/05/22 0816 12/05/22 1120 12/05/22 1344  BP: (!) 138/42  (!) 109/57 (!) 130/50  Pulse: 91  (!) 41 71  Resp: 17  20 17   Temp: 99.1 F (37.3 C)  98.4 F (36.9 C) 98.5 F  (36.9 C)  TempSrc: Oral  Oral   SpO2: 93% 95% 91% 100%  Weight:      Height:         Author: Lynden Oxford, MD 12/05/2022 6:12 PM  Please look on [email or web address] to find out who is on call.

## 2022-12-05 NOTE — Plan of Care (Signed)
  Problem: Coping: Goal: Level of anxiety will decrease Outcome: Progressing   Problem: Pain Managment: Goal: General experience of comfort will improve Outcome: Progressing   Problem: Safety: Goal: Ability to remain free from injury will improve Outcome: Progressing   

## 2022-12-06 ENCOUNTER — Inpatient Hospital Stay (HOSPITAL_COMMUNITY): Payer: Medicare Other

## 2022-12-06 DIAGNOSIS — S72001A Fracture of unspecified part of neck of right femur, initial encounter for closed fracture: Secondary | ICD-10-CM | POA: Diagnosis not present

## 2022-12-06 LAB — CBC
HCT: 23.6 % — ABNORMAL LOW (ref 36.0–46.0)
Hemoglobin: 7.5 g/dL — ABNORMAL LOW (ref 12.0–15.0)
MCH: 30.7 pg (ref 26.0–34.0)
MCHC: 31.8 g/dL (ref 30.0–36.0)
MCV: 96.7 fL (ref 80.0–100.0)
Platelets: 87 10*3/uL — ABNORMAL LOW (ref 150–400)
RBC: 2.44 MIL/uL — ABNORMAL LOW (ref 3.87–5.11)
RDW: 14.8 % (ref 11.5–15.5)
WBC: 6.7 10*3/uL (ref 4.0–10.5)
nRBC: 0 % (ref 0.0–0.2)

## 2022-12-06 LAB — BASIC METABOLIC PANEL
Anion gap: 7 (ref 5–15)
BUN: 38 mg/dL — ABNORMAL HIGH (ref 8–23)
CO2: 21 mmol/L — ABNORMAL LOW (ref 22–32)
Calcium: 7.7 mg/dL — ABNORMAL LOW (ref 8.9–10.3)
Chloride: 108 mmol/L (ref 98–111)
Creatinine, Ser: 1.51 mg/dL — ABNORMAL HIGH (ref 0.44–1.00)
GFR, Estimated: 35 mL/min — ABNORMAL LOW (ref >60)
Glucose, Bld: 132 mg/dL — ABNORMAL HIGH (ref 70–99)
Potassium: 4.2 mmol/L (ref 3.5–5.1)
Sodium: 136 mmol/L (ref 135–145)

## 2022-12-06 LAB — MAGNESIUM: Magnesium: 2 mg/dL (ref 1.7–2.4)

## 2022-12-06 MED ORDER — SALINE SPRAY 0.65 % NA SOLN
1.0000 | NASAL | Status: DC | PRN
  Filled 2022-12-06: qty 44

## 2022-12-06 MED ORDER — ACETAMINOPHEN 500 MG PO TABS
500.0000 mg | ORAL_TABLET | Freq: Three times a day (TID) | ORAL | Status: DC
  Administered 2022-12-06 – 2022-12-07 (×4): 500 mg via ORAL
  Filled 2022-12-06 (×4): qty 1

## 2022-12-06 NOTE — TOC Progression Note (Signed)
Transition of Care [facility]) - Progression Note    Patient Details  Name: Judith Stevens MRN: [id number] Date of Birth: 08/09/1940  Transition of Care [facility]) CM/SW Contact  Darleene Cleaver, [place] Phone Number: 12/06/2022, 3:27 PM  Clinical Narrative:     CSW spoke to patient and her daughter Camelia Eng, [phone], to discuss bed offers.  Patient and daughter would like to go to [facility], if a bed is available.  CSW explained to them that it is still being reviewed, and they have not said yes yet.  CSW informed patient and daughter that [facility] offered a bed.  Per patient and her daughter they are also interested in [facility] if possible.  CSW informed them that it is still being reviewed.  Patient asked what to expect at SNF, CSW explained how insurance will pay for the stay and what to expect while she is in rehab.  Per patient, she has never been to rehab before.  Patient and daughter asked what to bring, CSW informed them of what clothing items to bring.  CSW asked if patient will need EMS transport and they said yes.  TOC to follow up with patient's daughter on bed choices.   Expected Discharge Plan: Skilled Nursing Facility Barriers to Discharge: No Barriers Identified  Expected Discharge Plan and Services       Living arrangements for the past 2 months: Single Family Home                                       Social Determinants of Health (SDOH) Interventions SDOH Screenings   Food Insecurity: No Food Insecurity (12/01/2022)  Housing: Low Risk  (12/01/2022)  Transportation Needs: No Transportation Needs (12/01/2022)  Utilities: Not At Risk (12/01/2022)  Tobacco Use: Medium Risk (12/02/2022)    Readmission Risk Interventions    12/04/2022   12:03 PM  Readmission Risk Prevention Plan  Transportation Screening Complete  PCP or Specialist Appt within 5-7 Days Complete  Home Care Screening Complete  Medication Review (RN CM) Complete

## 2022-12-06 NOTE — Progress Notes (Signed)
[facility] Progress Note Patient: Judith Stevens ZOX:[id number] DOB: Oct 12, 1940 DOA: 12/01/2022  DOS: the patient was seen and examined on 12/06/2022  Brief hospital course: PMH of CAD, COPD, HLD, former smoker presented to the hospital with complaints of right hip fracture after mechanical fall. Although she reports symptoms of vertigo intermittently whenever she is moving her head.  Assessment and Plan: Right acute mildly displaced proximal femoral intertrochanteric fracture. Orthopedic consulted. Underwent intramedullary nail implant. Pain management and DVT prophylaxis per orthopedic. Monitor. Weightbearing per orthopedic as well. PT OT consulted.  Unwitnessed fall. Suspected BPPV. Requested vestibular evaluation with PT. Does not have any nystagmus. As of right now unable to perform vestibular evaluation given lack of symptoms.  Acute kidney injury. Mild hyponatremia. Likely ischemic injury in the setting of hemodynamic insult from hypotension intraoperatively and perioperatively. Patient required pressors after the surgery. Blood pressure improving. FENa 0.3 %, suggesting prerenal etiology.  Urine sodium less than 10. Currently holding all antihypertensive medication. Ultrasound renal unremarkable. Urine output more than 500 mL in last 24 hours. Serum creatinine appears to be improving. Will stop IV fluid and monitor.  Acute normocytic anemia Acute thrombocytopenia. Expected acute blood loss anemia. Dilutional anemia. Patient baseline hemoglobin was around 12-13. On presentation hemoglobin around 11. Dropped down to 10 preoperatively and currently around 7 postoperatively. Received 1 PRBC 9/6. Repeat H&H relatively stable.   Mild swelling at the surgical site but no significant worsening ongoing hematoma. No evidence of ongoing bleeding.  Platelet count significantly low as well.  Likely dilutional component there as well.  Monitor. Orthopedic started the  patient on Eliquis despite thrombocytopenia.  Monitor for bleeding.  Brownish discoloration seen at the surgical site.  Will monitor. Transfuse for hemoglobin less than 7.  Acute delirium. Resolved. Likely in the setting of recent anesthesia use, pain medication use as well as acute kidney injury and hypotension. Monitor.  No focal deficit.  CAD. Remote history. Currently no angina symptoms. Underwent surgery without any complication. Monitor  HTN. Patient is on bisoprolol, Cardizem, HCTZ, Imdur, Benicar. Her blood pressure is actually rather soft right now. Will be holding these medications and monitor.  GERD. Continue PPI.  Mood disorder. Anxiety Reportedly patient is supposed to be on Zoloft in the past and Lexapro right now although unable to verify from the pharmacy sources about presence of this medication. Both have been discontinued. Continue as needed Xanax.  Confirm from PDMP review that the patient is taking this medication.  Multiple allergies. While in the room the patient started reporting that she is having some shortness of breath.  On clinical examination does not have any hypoxia does not have any wheezing or stridor. She thinks that this is the same feeling that she gets when she has allergic reaction. She actually takes Xanax plus terbutaline whenever she ingests anything that can cause allergy!  Has not seen any allergy specialist. Informed the patient that most of her allergies are less likely allergic reaction based on her current complaint. Also informed  HLD. Continue statin.   Subjective: Did not have any shortness of breath at time of beginning but while discussing further reports shortness of breath.  No nausea no vomiting no fever no chills.  Physical Exam: General: in Mild distress, No Rash Cardiovascular: S1 and S2 Present, No Murmur Respiratory: Good respiratory effort, Bilateral Air entry present. No Crackles, No wheezes Abdomen: Bowel Sound  present, No tenderness Extremities: Right leg edema Neuro: Alert and oriented x3, no new focal deficit  Data Reviewed: I have Reviewed nursing notes, Vitals, and Lab results. Since last encounter, pertinent lab results CBC and BMP   . I have ordered test including CBC and BMP  . I have ordered imaging x-ray knee x-ray tibia-fibula  .   Disposition: Status is: Inpatient Remains inpatient appropriate because: Monitor for stability  SCDs Start: 12/01/22 1732   Family Communication: Daughter at bedside Level of care: Med-Surg   Vitals:   12/05/22 1344 12/05/22 2138 12/06/22 0545 12/06/22 1319  BP: (!) 130/50 (!) 156/50 109/76 (!) 150/45  Pulse: 71 86 73 68  Resp: 17 18 18 17   Temp: 98.5 F (36.9 C) 98.7 F (37.1 C) 98.5 F (36.9 C) 97.8 F (36.6 C)  TempSrc:  Oral Oral Oral  SpO2: 100% 94% 97% 98%  Weight:      Height:         Author: Lynden Oxford, MD 12/06/2022 6:52 PM  Please look on [email or web address] to find out who is on call.

## 2022-12-07 ENCOUNTER — Inpatient Hospital Stay (HOSPITAL_COMMUNITY): Payer: Medicare Other

## 2022-12-07 DIAGNOSIS — S72001A Fracture of unspecified part of neck of right femur, initial encounter for closed fracture: Secondary | ICD-10-CM | POA: Diagnosis not present

## 2022-12-07 DIAGNOSIS — R609 Edema, unspecified: Secondary | ICD-10-CM

## 2022-12-07 LAB — CBC
HCT: 24.7 % — ABNORMAL LOW (ref 36.0–46.0)
Hemoglobin: 7.9 g/dL — ABNORMAL LOW (ref 12.0–15.0)
MCH: 30.7 pg (ref 26.0–34.0)
MCHC: 32 g/dL (ref 30.0–36.0)
MCV: 96.1 fL (ref 80.0–100.0)
Platelets: 101 10*3/uL — ABNORMAL LOW (ref 150–400)
RBC: 2.57 MIL/uL — ABNORMAL LOW (ref 3.87–5.11)
RDW: 15.2 % (ref 11.5–15.5)
WBC: 8.3 10*3/uL (ref 4.0–10.5)
nRBC: 0 % (ref 0.0–0.2)

## 2022-12-07 LAB — BASIC METABOLIC PANEL
Anion gap: 8 (ref 5–15)
BUN: 35 mg/dL — ABNORMAL HIGH (ref 8–23)
CO2: 22 mmol/L (ref 22–32)
Calcium: 8.1 mg/dL — ABNORMAL LOW (ref 8.9–10.3)
Chloride: 108 mmol/L (ref 98–111)
Creatinine, Ser: 1.62 mg/dL — ABNORMAL HIGH (ref 0.44–1.00)
GFR, Estimated: 32 mL/min — ABNORMAL LOW (ref >60)
Glucose, Bld: 105 mg/dL — ABNORMAL HIGH (ref 70–99)
Potassium: 4.2 mmol/L (ref 3.5–5.1)
Sodium: 138 mmol/L (ref 135–145)

## 2022-12-07 LAB — MAGNESIUM: Magnesium: 2.1 mg/dL (ref 1.7–2.4)

## 2022-12-07 MED ORDER — ALPRAZOLAM 1 MG PO TABS: 1.0000 mg | ORAL_TABLET | Freq: Three times a day (TID) | ORAL | 0 refills | Status: AC | PRN

## 2022-12-07 MED ORDER — ISOSORBIDE MONONITRATE ER 30 MG PO TB24
45.0000 mg | ORAL_TABLET | Freq: Every day | ORAL | Status: DC
  Administered 2022-12-07: 45 mg via ORAL
  Filled 2022-12-07: qty 2

## 2022-12-07 MED ORDER — ONDANSETRON 4 MG PO TBDP
4.0000 mg | ORAL_TABLET | Freq: Three times a day (TID) | ORAL | Status: DC | PRN
  Administered 2022-12-07: 4 mg via ORAL
  Filled 2022-12-07: qty 1

## 2022-12-07 MED ORDER — HEPARIN SODIUM (PORCINE) 5000 UNIT/ML IJ SOLN: 5000.0000 [IU] | Freq: Three times a day (TID) | INTRAMUSCULAR | Status: DC

## 2022-12-07 MED ORDER — ACETAMINOPHEN 500 MG PO TABS: 500.0000 mg | ORAL_TABLET | Freq: Three times a day (TID) | ORAL | 0 refills | Status: AC

## 2022-12-07 NOTE — Progress Notes (Signed)
    5 Days Post-Op Procedure(s) (LRB): INTRAMEDULLARY (IM) NAIL INTERTROCHANTERIC (Right)  Subjective: Patient reports pain is mild-moderate this morning.  Slept better overnight.  Initially with some post-op delirium -- resolved.  Has noticed some pain and swelling around the thigh and knee last two days.  Reports weakness and difficulty standing/sitting with right leg.  X-rays taken yesterday, curious of results.  Plan to work with PT today.  Hgb 7.9 today.  S/p transfusion 1 unit PRBC shortly after surgery.  Objective:   VITALS:   Vitals:   12/06/22 1319 12/06/22 1923 12/06/22 2200 12/07/22 0629  BP: (!) 150/45  (!) 168/60 (!) 148/126  Pulse: 68  79 83  Resp: 17  17 16   Temp: 97.8 F (36.6 C)  98.2 F (36.8 C) 98.7 F (37.1 C)  TempSrc: Oral  Oral Oral  SpO2: 98% 99% 97% 96%  Weight:      Height:        AAOx4, resting comfortably in bed, answering questions appropriately  MSK: Neurovascular intact Sensation intact distally Intact pulses distally, CRT < 2 Dorsiflexion/Plantar flexion intact Decreased strength with right SLR, mainly with quadriceps engagement Incision: dressing C/D/I Compartment soft Pitting edema RLE 1+ to 2+ Wiggles toes appropriately   Lab Results  Component Value Date   WBC 8.3 12/07/2022   HGB 7.9 (L) 12/07/2022   HCT 24.7 (L) 12/07/2022   MCV 96.1 12/07/2022   PLT 101 (L) 12/07/2022   BMET    Component Value Date/Time   NA 138 12/07/2022 0304   NA 141 06/25/2021 0851   K 4.2 12/07/2022 0304   CL 108 12/07/2022 0304   CO2 22 12/07/2022 0304   GLUCOSE 105 (H) 12/07/2022 0304   BUN 35 (H) 12/07/2022 0304   BUN 9 06/25/2021 0851   CREATININE 1.62 (H) 12/07/2022 0304   CREATININE 0.52 (L) 04/27/2016 0923   CALCIUM 8.1 (L) 12/07/2022 0304   EGFR 91 06/25/2021 0851   GFRNONAA 32 (L) 12/07/2022 0304   GFRNONAA >89 01/24/2016 1127    Xray: PACU x-rays demonstrate well reduced fracture, hardware intact, no adverse features.  9/8:  right tibia/fibula and knee x-rays -- hardware appears appropriately placed and intact, mild chondrocalcinosis, no adverse features.  Assessment/Plan: 5 Days Post-Op   Principal Problem:   Closed right hip fracture (HCC)  Status post right intertrochanteric femur fracture ORIF with cephalomedullary nail 12/02/22  9/6: Mild acute anemia, likely d/t blood loss.  Received transfusion 1 unit PRBC yesterday.  Hgb improved to 8.2 today (yesterday 7.2).  Post op recs: WB: WBAT RLE Abx: Vanco and gentamicin given perioperative  Imaging: PACU xrays Dressing: keep intact until follow up, change PRN if soiled or saturated. DVT prophylaxis: lovenox starting POD1, consider discharging on Xarelto or Eliquis prophylaxis dose postop DC: per medicine, likely SNF Follow up: 2 weeks after surgery for a wound check with Dr. Blanchie Dessert at [facility].  Address: [address], [place], [place] [address]  Office Phone: [phone]    Judith Stevens 12/07/2022, 7:17 AM   Weber Cooks, MD  Contact information:   [phone] 7am-5pm epic message Dr. Blanchie Dessert, or call office for patient follow up: [phone] After hours and holidays please check [email or web address] for group call information for [facility]

## 2022-12-07 NOTE — TOC Transition Note (Signed)
Transition of Care [facility]) - CM/SW Discharge Note   Patient Details  Name: Judith Stevens MRN: [id number] Date of Birth: 1940/04/13  Transition of Care [facility]) CM/SW Contact:  Amada Jupiter, LCSW Phone Number: 12/07/2022, 2:06 PM   Clinical Narrative:     Pt is medically cleared for dc today and bed accepted at [facility].  Facility can admit pt today.  Pt and daughter aware and agreeable.  PTAR called at 1400.  RN to call report to [phone].  No further TOC needs.  Final next level of care: Skilled Nursing Facility Barriers to Discharge: Barriers Resolved   Patient Goals and CMS Choice CMS Medicare.gov Compare Post Acute Care list provided to:: Patient Choice offered to / list presented to : Patient  Discharge Placement PASRR number recieved: 12/04/22 PASRR number recieved: 12/04/22            Patient chooses bed at: [facility] Patient to be transferred to facility by: PTAR Name of family member notified: daughter Patient and family notified of of transfer: 12/07/22  Discharge Plan and Services Additional resources added to the After Visit Summary for                  DME Arranged: N/A DME Agency: NA                  Social Determinants of Health (SDOH) Interventions SDOH Screenings   Food Insecurity: No Food Insecurity (12/01/2022)  Housing: Low Risk  (12/01/2022)  Transportation Needs: No Transportation Needs (12/01/2022)  Utilities: Not At Risk (12/01/2022)  Tobacco Use: Medium Risk (12/02/2022)     Readmission Risk Interventions    12/04/2022   12:03 PM  Readmission Risk Prevention Plan  Transportation Screening Complete  PCP or Specialist Appt within 5-7 Days Complete  Home Care Screening Complete  Medication Review (RN CM) Complete

## 2022-12-07 NOTE — Plan of Care (Signed)
Attempted to call nursing report to staff at [facility]. Phone rang many times without answer. Placing AVS/instructions in discharge packet. Awaiting transport by PTAR. Haydee Salter, RN 12/07/22 3:01 PM

## 2022-12-07 NOTE — Progress Notes (Signed)
Physical Therapy Treatment Patient Details Name: Judith Stevens MRN: [id number] DOB: Apr 30, 1940 Today's Date: 12/07/2022   History of Present Illness 82  yo female presents to therapy s/p mechanical fall in home setting on 12/01/2022 resulting in R  displaced intertochanteric femoral fx. Pt underwent R IM nail on 12/02/2022. Pt is currently R LE WBAT. Pt has PMH including but not limited to: arthritis, asthma, aortic valve stenosis, CAD, COPD, GERD, HLD, HTN, PE, back surgery and SI joint fusion.    PT Comments  Pt continues to progress toward goals however is limited by pain and fatigue. D/c plan remains appropriate. Continue to follow in acute setting   If plan is discharge home, recommend the following:     Can travel by private vehicle     No  Equipment Recommendations  Rolling walker (2 wheels)    Recommendations for Other Services       Precautions / Restrictions Precautions Precautions: Fall Precaution Comments: h/o vertigo-no symptoms currrently Restrictions RLE Weight Bearing: Weight bearing as tolerated     Mobility  Bed Mobility Overal bed mobility: Needs Assistance Bed Mobility: Supine to Sit     Supine to sit: Min assist, Mod assist     General bed mobility comments: multimodal cues for technique and sequencing, assist for Rt LE, trunk upright    Transfers Overall transfer level: Needs assistance Equipment used: Rolling walker (2 wheels) Transfers: Sit to/from Stand Sit to Stand: Min assist           General transfer comment: verbal cues for UE and LE positioning, weight shifting; slow to mobilize due to pain    Ambulation/Gait Ambulation/Gait assistance: Min assist Gait Distance (Feet): 6 Feet Assistive device: Rolling walker (2 wheels) Gait Pattern/deviations: Step-to pattern, Decreased stance time - right, Antalgic       General Gait Details: cues for sequence, posture, RW position   Stairs             Wheelchair Mobility      Tilt Bed    Modified Rankin (Stroke Patients Only)       Balance     Sitting balance-Leahy Scale: Fair                                      Cognition Arousal: Alert Behavior During Therapy: WFL for tasks assessed/performed Overall Cognitive Status: Within Functional Limits for tasks assessed                                 General Comments: pleasant able to follow commands, occaional redirection        Exercises General Exercises - Lower Extremity Ankle Circles/Pumps: AROM, Both, Supine, 15 reps Heel Slides: AAROM, Right, 5 reps, Supine, Limitations    General Comments        Pertinent Vitals/Pain Pain Assessment Pain Assessment: Faces Faces Pain Scale: Hurts little more Pain Location: R hip Pain Descriptors / Indicators: Discomfort, Guarding, Grimacing, Sore Pain Intervention(s): Limited activity within patient's tolerance, Monitored during session, Premedicated before session, Repositioned, Ice applied    Home Living                          Prior Function            PT Goals (current goals can now be found in the  care plan section) Acute Rehab PT Goals Patient Stated Goal: to go home tomorrow PT Goal Formulation: With patient Time For Goal Achievement: 12/17/22 Potential to Achieve Goals: Good Progress towards PT goals: Progressing toward goals    Frequency    Min 1X/week      PT Plan      Co-evaluation              AM-PAC PT "6 Clicks" Mobility   Outcome Measure  Help needed turning from your back to your side while in a flat bed without using bedrails?: A Little Help needed moving from lying on your back to sitting on the side of a flat bed without using bedrails?: A Little Help needed moving to and from a bed to a chair (including a wheelchair)?: A Little Help needed standing up from a chair using your arms (e.g., wheelchair or bedside chair)?: A Little Help needed to walk in hospital  room?: A Lot Help needed climbing 3-5 steps with a railing? : A Lot 6 Click Score: 16    End of Session Equipment Utilized During Treatment: Gait belt Activity Tolerance: Patient tolerated treatment well;Patient limited by fatigue Patient left: with call bell/phone within reach;in chair;with chair alarm set Nurse Communication: Mobility status PT Visit Diagnosis: Difficulty in walking, not elsewhere classified (R26.2);Pain Pain - Right/Left: Right Pain - part of body: Hip     Time: 1143-1203 PT Time Calculation (min) (ACUTE ONLY): 20 min  Charges:    $Therapeutic Activity: 8-22 mins PT General Charges $$ ACUTE PT VISIT: 1 Visit                     Marisabel Macpherson, PT  Acute Rehab Dept [facility]) [phone]  12/07/2022    [facility] 12/07/2022, 12:09 PM

## 2022-12-07 NOTE — Plan of Care (Signed)
  Problem: Coping: Goal: Level of anxiety will decrease Outcome: Progressing   Problem: Pain Managment: Goal: General experience of comfort will improve Outcome: Progressing   Problem: Safety: Goal: Ability to remain free from injury will improve Outcome: Progressing   

## 2022-12-07 NOTE — Plan of Care (Signed)
Problem: Education: Goal: Knowledge of General Education information will improve Description: Including pain rating scale, medication(s)/side effects and non-pharmacologic comfort measures Outcome: Progressing   Problem: Clinical Measurements: Goal: Ability to maintain clinical measurements within normal limits will improve Outcome: Progressing   Problem: Activity: Goal: Risk for activity intolerance will decrease Outcome: Progressing   Problem: Coping: Goal: Level of anxiety will decrease Outcome: Progressing   Problem: Pain Managment: Goal: General experience of comfort will improve Outcome: Progressing   Problem: Safety: Goal: Ability to remain free from injury will improve Outcome: Progressing   Haydee Salter, RN 12/07/22 8:38 AM

## 2022-12-07 NOTE — Progress Notes (Signed)
RLE venous duplex has been completed.   Results can be found under chart review under CV PROC. 12/07/2022 1:33 PM Curtina Grills RVT, RDMS

## 2022-12-07 NOTE — Discharge Summary (Signed)
Physician Discharge Summary   Patient: Judith Stevens MRN: [id number] DOB: 1940/06/20  Admit date:     12/01/2022  Discharge date: 12/07/22  Discharge Physician: Lynden Oxford  PCP: Richmond Campbell., PA-C  Recommendations at discharge: Follow up with PCP  Check CBC and BMP in 1 week Needs vestibular evaluation to rule out BPPV   Follow-up Information     Joen Laura, MD Follow up in 2 week(s).   Specialty: Orthopedic Surgery Contact information: [address] [place] [address] [phone]         Richmond Campbell., PA-C. Schedule an appointment as soon as possible for a visit in 1 week(s).   Specialty: Family Medicine Why: with CBC lab to look at blood counts, with BMP lab to look at kidney and electrolytes Contact information: [address] [place] [address] [phone]                Discharge Diagnoses: Principal Problem:   Closed right hip fracture (HCC) Unwitnessed fall. Suspected BPPV. Acute kidney injury. Mild hyponatremia. Acute normocytic anemia Acute thrombocytopenia. Expected acute blood loss anemia. Dilutional anemia. Acute delirium.  CAD. HTN.  Mood disorder. Anxiety  Brief hospital course: PMH of CAD, COPD, HLD, former smoker presented to the hospital with complaints of right hip fracture after mechanical fall. Although she reports symptoms of vertigo intermittently whenever she is moving her head.  Assessment and Plan: Right acute mildly displaced proximal femoral intertrochanteric fracture. Orthopedic consulted. Underwent intramedullary nail implant. Pain management and DVT prophylaxis per orthopedic. Will be on apixaban Weightbearing as tolerated per orthopedic as well. PT OT recommended SNF Has some swelling on right leg and knee.  X ray knee and tibia negative for   Unwitnessed fall. Suspected BPPV. Requested vestibular evaluation with PT. Does not have any nystagmus. As of right  now unable to perform vestibular evaluation given lack of symptoms.  Acute kidney injury. Mild hyponatremia. Likely ischemic injury in the setting of hemodynamic insult from hypotension intraoperatively and perioperatively. Patient required pressors after the surgery. Blood pressure improving. FENa 0.3 %, suggesting prerenal etiology.  Urine sodium less than 10. Currently holding all antihypertensive medication. Ultrasound renal unremarkable. Urine output more than 500 mL in last 24 hours. Serum creatinine appears to be improving. Will stop IV fluid and monitor.  Acute normocytic anemia Acute thrombocytopenia. Expected acute blood loss anemia. Dilutional anemia. Patient baseline hemoglobin was around 12-13. On presentation hemoglobin around 11. Dropped down to 10 preoperatively and currently around 7 postoperatively. Received 1 PRBC 9/6. Repeat H&H relatively stable.   Mild swelling at the surgical site but no significant worsening ongoing hematoma. No evidence of ongoing bleeding.  Platelet count low but improving. Likely dilutional component there as well.  Recheck in 1 week  Acute delirium. Resolved. Likely in the setting of recent anesthesia use, pain medication use as well as acute kidney injury and hypotension. Monitor.  No focal deficit.  CAD. Remote history. Currently no angina symptoms. Underwent surgery without any cardiac complication.  HTN. Patient is on bisoprolol, Cardizem, HCTZ, Imdur, Benicar. Given renal function holding most of the meds and will resume imdur only.   GERD. Continue PPI.  Mood disorder. Anxiety Reportedly patient is supposed to be on Zoloft in the past and Lexapro right now although unable to verify from the pharmacy sources about presence of this medication. Both have been discontinued. Continue as needed Xanax.  Confirm from PDMP review that the patient is taking this  medication.  Multiple allergies. While in the room the patient  started reporting that she is having some shortness of breath.  On clinical examination does not have any hypoxia does not have any wheezing or stridor. She thinks that this is the same feeling that she gets when she has allergic reaction. She actually takes Xanax plus terbutaline whenever she ingests anything that can cause allergy!  Has not seen any allergy specialist. Informed the patient that most of her allergies are less likely allergic reaction based on her current complaint.  HLD. Continue statin.  Pain control - [employer] Controlled Substance Reporting System database was reviewed. and patient was instructed, not to drive, operate heavy machinery, perform activities at heights, swimming or participation in water activities or provide baby-sitting services while on Pain, Sleep and Anxiety Medications; until their outpatient Physician has advised to do so again. Also recommended to not to take more than prescribed Pain, Sleep and Anxiety Medications.  Consultants:  Orthopedics   Procedures performed:   INTRAMEDULLARY (IM) NAIL INTERTROCHANTERIC   SURGEON:  DANIEL A MARCHWIANY  DISCHARGE MEDICATION: Allergies as of 12/07/2022       Reactions   Cefzil [cefprozil] Shortness Of Breath   Clarithromycin Shortness Of Breath   SOB   Egg Solids, Whole Shortness Of Breath   Gluten Meal Shortness Of Breath   Keflex [cephalexin] Shortness Of Breath, Rash   Lactose Intolerance (gi) Shortness Of Breath   ALL DAIRY PRODUCTS   Levaquin [levofloxacin] Shortness Of Breath, Rash   Milk-related Compounds Shortness Of Breath   Naproxen Shortness Of Breath   Shortness of breath Shortness of breath   Peanuts [peanut Oil] Shortness Of Breath   Penicillins Shortness Of Breath   Percocet [oxycodone-acetaminophen] Shortness Of Breath   Prednisone Shortness Of Breath, Other (See Comments)   Rash   Red Dye #40 (allura Red) Shortness Of Breath   Sulfa Antibiotics Shortness Of Breath    Sulfacetamide Sodium Shortness Of Breath   Tetracycline Hcl Shortness Of Breath   Tetracyclines & Related Shortness Of Breath   Wheat Shortness Of Breath   Influenza Vaccines    Other reaction(s): Other (See Comments) Arm redness and swelling.   Amoxicillin-pot Clavulanate Rash   Ibuprofen Rash   Methocarbamol Nausea And Vomiting   Moxifloxacin Rash   Penicillin G Rash   Sulfamethoxazole-trimethoprim Rash   Tetracycline Rash   Tramadol Rash        Medication List     STOP taking these medications    bisoprolol 5 MG tablet Commonly known as: ZEBETA   diltiazem 300 MG 24 hr capsule Commonly known as: CARDIZEM CD   hydrochlorothiazide 12.5 MG capsule Commonly known as: MICROZIDE   olmesartan 40 MG tablet Commonly known as: BENICAR       TAKE these medications    acetaminophen 500 MG tablet Commonly known as: TYLENOL Take 1 tablet (500 mg total) by mouth 3 (three) times daily.   ALPRAZolam 1 MG tablet Commonly known as: XANAX Take 1 tablet (1 mg total) by mouth 3 (three) times daily as needed for anxiety.   apixaban 2.5 MG Tabs tablet Commonly known as: Eliquis Take 1 tablet (2.5 mg total) by mouth 2 (two) times daily.   atorvastatin 80 MG tablet Commonly known as: LIPITOR TAKE 1 TABLET BY MOUTH EVERY DAY   escitalopram 10 MG tablet Commonly known as: LEXAPRO Take 10 mg by mouth daily.   ezetimibe 10 MG tablet Commonly known as: ZETIA TAKE 1 TABLET BY  MOUTH EVERYDAY AT BEDTIME   fluticasone 50 MCG/ACT nasal spray Commonly known as: FLONASE 2 sprays by Each Nare route daily.   Fluticasone-Salmeterol 250-50 MCG/DOSE Aepb Commonly known as: ADVAIR Inhale 1 puff into the lungs 2 (two) times daily.   HYDROcodone-acetaminophen 5-325 MG tablet Commonly known as: NORCO/VICODIN Take 0.5-1 tablets by mouth every 4 (four) hours as needed for up to 7 days for moderate pain.   isosorbide mononitrate 30 MG 24 hr tablet Commonly known as: IMDUR Take 1.5  tablets (45 mg total) by mouth daily.   nitroGLYCERIN 0.4 MG SL tablet Commonly known as: Nitrostat Place 1 tablet (0.4 mg total) under the tongue every 5 (five) minutes as needed for chest pain. Needs appointment for future refills   ondansetron 8 MG disintegrating tablet Commonly known as: ZOFRAN-ODT Take 8 mg by mouth every 8 (eight) hours as needed for nausea or vomiting.   pantoprazole 40 MG tablet Commonly known as: PROTONIX Take 40 mg by mouth 2 (two) times daily.   terbutaline 5 MG tablet Commonly known as: BRETHINE Take 1 tablet (5 mg total) by mouth every 6 (six) hours as needed.   Ventolin HFA 108 (90 Base) MCG/ACT inhaler Generic drug: albuterol Inhale 2 puffs into the lungs every 6 (six) hours as needed for wheezing.   Vitamin D3 50 MCG (2000 UT) Tabs Take 2,000 Units by mouth daily.       Disposition: SNF Diet recommendation: Cardiac diet  Discharge Exam: Vitals:   12/06/22 1319 12/06/22 1923 12/06/22 2200 12/07/22 0629  BP: (!) 150/45  (!) 168/60 (!) 148/126  Pulse: 68  79 83  Resp: 17  17 16   Temp: 97.8 F (36.6 C)  98.2 F (36.8 C) 98.7 F (37.1 C)  TempSrc: Oral  Oral Oral  SpO2: 98% 99% 97% 96%  Weight:      Height:       General: Appear in mild distress; no visible Abnormal Neck Mass Or lumps, Conjunctiva normal Cardiovascular: S1 and S2 Present, no Murmur, Respiratory: good respiratory effort, Bilateral Air entry present and faint basal Crackles, no wheezes Abdomen: Bowel Sound present, Non tender  Extremities: right Pedal edema Neurology: alert and oriented to time, place, and person  Filed Weights   12/01/22 1135 12/02/22 1159  Weight: 63.5 kg 63.5 kg   Condition at discharge: stable  The results of significant diagnostics from this hospitalization (including imaging, microbiology, ancillary and laboratory) are listed below for reference.   Imaging Studies: VAS [place] LOWER EXTREMITY VENOUS (DVT)  Result Date: 12/07/2022  Lower Venous  DVT Study Patient Name:  REGENIA LAMY  Date of Exam:   12/07/2022 Medical Rec #: [id number]              Accession #:    [id number] Date of Birth: [birth year]               Patient Gender: F Patient Age:   29 years Exam Location:  [facility] Procedure:      VAS [place] LOWER EXTREMITY VENOUS (DVT) Referring Phys: Tyheim Vanalstyne --------------------------------------------------------------------------------  Indications: Edema. Other Indications: Recent right hip fracture s/p surgical repair on 12/02/22. Limitations: Poor ultrasound/tissue interface, patient immobility and body habitus. Comparison Study: Previous exam on 09/04/22 was negative for DVT Performing Technologist: Ernestene Mention RVT, RDMS  Examination Guidelines: A complete evaluation includes B-mode imaging, spectral Doppler, color Doppler, and power Doppler as needed of all accessible portions of each vessel. Bilateral testing is considered an integral part of  a complete examination. Limited examinations for reoccurring indications may be performed as noted. The reflux portion of the exam is performed with the patient in reverse Trendelenburg.  +--------+---------------+---------+-----------+----------+--------------------+ RIGHT   CompressibilityPhasicitySpontaneityPropertiesThrombus Aging       +--------+---------------+---------+-----------+----------+--------------------+ CFV     Full           Yes      Yes                                       +--------+---------------+---------+-----------+----------+--------------------+ SFJ     Full                                                              +--------+---------------+---------+-----------+----------+--------------------+ FV Prox Full           Yes      Yes                                       +--------+---------------+---------+-----------+----------+--------------------+ FV Mid  Full           Yes      Yes                                        +--------+---------------+---------+-----------+----------+--------------------+ FV      Full           Yes      Yes                                       Distal                                                                    +--------+---------------+---------+-----------+----------+--------------------+ PFV                    Yes      Yes                  patent by                                                                 color/doppler        +--------+---------------+---------+-----------+----------+--------------------+ POP     Full           Yes      Yes                                       +--------+---------------+---------+-----------+----------+--------------------+  PTV                                                  Not well visualized  +--------+---------------+---------+-----------+----------+--------------------+ PERO    Full                                                              +--------+---------------+---------+-----------+----------+--------------------+   +----+---------------+---------+-----------+----------+--------------+ LEFTCompressibilityPhasicitySpontaneityPropertiesThrombus Aging +----+---------------+---------+-----------+----------+--------------+ CFV Full           Yes      Yes                                 +----+---------------+---------+-----------+----------+--------------+    Summary: RIGHT: - There is no evidence of deep vein thrombosis in the lower extremity.  - No cystic structure found in the popliteal fossa.  LEFT: - No evidence of common femoral vein obstruction.   *See table(s) above for measurements and observations.    Preliminary    DG Knee Right Port  Result Date: 12/06/2022 CLINICAL DATA:  Right knee pain EXAM: PORTABLE RIGHT KNEE - 1-2 VIEW COMPARISON:  12/01/2022 FINDINGS: Interval placement of antegrade long intramedullary rod and distal interlocking screw in the distal femur. No  perihardware fracture. No fracture or malalignment of the right knee. Moderate-sized knee joint effusion, increased from prior. Similar degenerative changes with chondrocalcinosis. Generalized soft tissue swelling. Atherosclerotic vascular calcification. IMPRESSION: 1. Interval placement of antegrade long intramedullary rod and distal interlocking screw in the distal femur. No perihardware fracture. 2. Moderate-sized knee joint effusion, increased from prior. Electronically Signed   By: Duanne Guess D.O.   On: 12/06/2022 19:03   DG Tibia/Fibula Right Port  Result Date: 12/06/2022 CLINICAL DATA:  Leg pain EXAM: PORTABLE RIGHT TIBIA AND FIBULA - 2 VIEW COMPARISON:  12/01/2022 FINDINGS: There is no evidence of fracture of the right tibia or fibula. No malalignment. Generalized soft tissue swelling. IMPRESSION: 1. No acute fracture or malalignment of the right tibia or fibula. 2. Generalized soft tissue swelling. Electronically Signed   By: Duanne Guess D.O.   On: 12/06/2022 19:00   US RENAL  Result Date: 12/04/2022 CLINICAL DATA:  Acute kidney injury EXAM: RENAL / URINARY TRACT ULTRASOUND COMPLETE COMPARISON:  None Available. FINDINGS: Right Kidney: Renal measurements: 10.6 x 6.4 x 6.1 cm = volume: 218.3 mL. Echogenicity within normal limits. No mass or hydronephrosis visualized. Left Kidney: Renal measurements: 10.6 x 5 x 4.4 cm = volume: 122.7 mL. Echogenicity within normal limits. No mass or hydronephrosis visualized. Bladder: Appears normal for degree of bladder distention. Other: None. IMPRESSION: Negative renal ultrasound. Electronically Signed   By: Jasmine Pang M.D.   On: 12/04/2022 19:23   DG FEMUR PORT, MIN 2 VIEWS RIGHT  Result Date: 12/02/2022 CLINICAL DATA:  Postop right femoral IM nail. EXAM: RIGHT FEMUR PORTABLE 2 VIEW COMPARISON:  Preoperative imaging. FINDINGS: Femoral intramedullary nail with trans trochanteric a distal locking screw fixation traverse intertrochanteric femur  fracture. Improved fracture alignment from preoperative imaging. Recent postsurgical change includes air and edema in the soft tissues. IMPRESSION: ORIF of intertrochanteric femur fracture without immediate postoperative complication.  Electronically Signed   By: Narda Rutherford M.D.   On: 12/02/2022 16:07   DG HIP UNILAT WITH PELVIS 2-3 VIEWS RIGHT  Result Date: 12/02/2022 CLINICAL DATA:  Intramedullary rod fixation of right-sided intertrochanteric femur fracture. EXAM: DG HIP (WITH OR WITHOUT PELVIS) 2-3V RIGHT; DG C-ARM 1-60 MIN-NO REPORT FLUOROSCOPY TIME: FLUOROSCOPY TIME 1 minute, 54 seconds (17.8 mGy) COMPARISON:  Right hip radiographs-12/01/2022 FINDINGS: 12 spot intraoperative fluoroscopic images of the right hip and femur are provided for review and demonstrate the sequela of intramedullary rod fixation of the right femur and dynamic screw fixation of the right femoral neck traversing known comminuted intertrochanteric femur fracture. The distal end of the femoral rod is transfixed with a single cancellous screw. Alignment appears near anatomic. Minimal amount of expected adjacent subcutaneous emphysema. No radiopaque foreign body. IMPRESSION: Post ORIF of right intertrochanteric femur fracture without evidence of complication. Electronically Signed   By: Simonne Come M.D.   On: 12/02/2022 14:15   DG C-Arm 1-60 Min-No Report  Result Date: 12/02/2022 Fluoroscopy was utilized by the requesting physician.  No radiographic interpretation.   DG C-Arm 1-60 Min-No Report  Result Date: 12/02/2022 Fluoroscopy was utilized by the requesting physician.  No radiographic interpretation.   DG Knee 1-2 Views Right  Result Date: 12/01/2022 CLINICAL DATA:  Mechanical fall.  Acute right hip fracture.  Pain. EXAM: RIGHT KNEE - 1-2 VIEW COMPARISON:  None Available. FINDINGS: Mild medial compartment joint space narrowing. Moderate medial and lateral compartment chondrocalcinosis. Tiny joint effusion. Diffuse  decreased bone mineralization. No acute fracture is seen. No dislocation. Mild atherosclerotic calcifications. IMPRESSION: 1. No acute fracture. 2. Mild medial compartment joint space narrowing. 3. Moderate medial and lateral compartment chondrocalcinosis. Electronically Signed   By: Neita Garnet M.D.   On: 12/01/2022 14:35   DG Chest Port 1 View  Result Date: 12/01/2022 CLINICAL DATA:  Preoperative.  Acute right hip fracture. EXAM: PORTABLE CHEST 1 VIEW COMPARISON:  Chest radiographs 01/23/2021 and 01/24/2016 FINDINGS: Cardiac silhouette and mediastinal contours within limits. Mild calcification within the aortic arch. The lungs are clear. No pleural effusion pneumothorax. Mild-to-moderate multilevel degenerative disc changes of the thoracic spine. IMPRESSION: No active disease. Electronically Signed   By: Neita Garnet M.D.   On: 12/01/2022 14:31   DG Hip Unilat With Pelvis 2-3 Views Right  Result Date: 12/01/2022 CLINICAL DATA:  Fall. Injured right hip. On floor for 3 hours. Some shortening noticed by EMS. EXAM: DG HIP (WITH OR WITHOUT PELVIS) 2-3V RIGHT COMPARISON:  None Available. FINDINGS: The technologist reports the superior aspect of the pelvis was not imaged due to patient pain. There is diffuse decreased bone mineralization. There is an acute fracture of the right proximal femoral intertrochanteric region with approximately 7 mm medial displacement of the distal fracture component with respect to the proximal fracture component. There is up to approximately 12 mm medial displacement of the lesser trochanter. Mild varus angulation. Mild bilateral femoroacetabular joint space narrowing, subchondral sclerosis, peripheral osteophytosis. Prior right sacroiliac joint fusion with 3 transverse metallic bars. Partial visualization of lumbosacral posterior fusion hardware. IMPRESSION: Acute mildly displaced right proximal femoral intertrochanteric fracture. Electronically Signed   By: Neita Garnet M.D.    On: 12/01/2022 14:30    Microbiology: Results for orders placed or performed during the hospital encounter of 04/17/19  SARS CORONAVIRUS 2 (TAT 6-24 HRS) Nasopharyngeal Nasopharyngeal Swab     Status: None   Collection Time: 04/17/19  5:23 PM   Specimen: Nasopharyngeal Swab  Result Value  Ref Range Status   SARS Coronavirus 2 NEGATIVE NEGATIVE Final    Comment: (NOTE) SARS-CoV-2 target nucleic acids are NOT DETECTED. The SARS-CoV-2 RNA is generally detectable in upper and lower respiratory specimens during the acute phase of infection. Negative results do not preclude SARS-CoV-2 infection, do not rule out co-infections with other pathogens, and should not be used as the sole basis for treatment or other patient management decisions. Negative results must be combined with clinical observations, patient history, and epidemiological information. The expected result is Negative. Fact Sheet for Patients: HairSlick.no Fact Sheet for Healthcare Providers: quierodirigir.com This test is not yet approved or cleared by the [place] FDA and  has been authorized for detection and/or diagnosis of SARS-CoV-2 by FDA under an Emergency Use Authorization (EUA). This EUA will remain  in effect (meaning this test can be used) for the duration of the COVID-19 declaration under Section 56 4(b)(1) of the Act, 21 U.S.C. section 360bbb-3(b)(1), unless the authorization is terminated or revoked sooner. Performed at [facility], [address] [place], [place] [address]    Labs: CBC: Recent Labs  Lab 12/01/22 1156 12/02/22 0326 12/04/22 0333 12/05/22 0326 12/05/22 1608 12/06/22 0647 12/07/22 0304  WBC 8.7   < > 9.3 7.8 8.2 6.7 8.3  NEUTROABS 7.8*  --  6.9  --   --   --   --   HGB 10.7*   < > 8.2* 7.5* 7.8* 7.5* 7.9*  HCT 32.8*   < > 24.8* 23.4* 23.9* 23.6* 24.7*  MCV 95.1   < > 91.5 95.5 94.8 96.7 96.1  PLT 147*   < > 90* 80*  84* 87* 101*   < > = values in this interval not displayed.   Basic Metabolic Panel: Recent Labs  Lab 12/03/22 0331 12/04/22 0333 12/05/22 0326 12/05/22 1608 12/06/22 0647 12/07/22 0304  NA 133* 129* 136 137 136 138  K 4.8 4.6 4.0 4.3 4.2 4.2  CL 100 97* 107 108 108 108  CO2 23 23 23 22  21* 22  GLUCOSE 114* 116* 120* 123* 132* 105*  BUN 39* 49* 45* 42* 38* 35*  CREATININE 1.54* 2.13* 2.08* 1.93* 1.51* 1.62*  CALCIUM 8.2* 7.8* 7.7* 7.5* 7.7* 8.1*  MG 1.9 2.0 2.1  --  2.0 2.1   Liver Function Tests: No results for input(s): "AST", "ALT", "ALKPHOS", "BILITOT", "PROT", "ALBUMIN" in the last 168 hours. CBG: No results for input(s): "GLUCAP" in the last 168 hours.  Discharge time spent: greater than 30 minutes.  Author: Lynden Oxford, MD  [facility]

## 2022-12-25 ENCOUNTER — Telehealth: Payer: Self-pay | Admitting: Cardiovascular Disease

## 2022-12-25 NOTE — Telephone Encounter (Signed)
FYI patient medication changed.  At ALF at this time

## 2022-12-25 NOTE — Telephone Encounter (Signed)
Pt c/o medication issue:  1. Name of Medication:   diltiazem (CARDIZEM CD) 300 MG 24 hr capsule   2. How are you currently taking this medication (dosage and times per day)?   Not taking  3. Are you having a reaction (difficulty breathing--STAT)?   4. What is your medication issue?    Caller [facility]) stated patient was in hospital (right femur fracture) and is now at rehab [facility] in [place], [place].  Caller wanted to update Dr Tresa Endo that patient is not longer taking this medication.  Caller stated can speak with Wellness Director-Scott Barnier for further information at phone# [phone].

## 2023-01-17 NOTE — Telephone Encounter (Signed)
Acknowledged.

## 2023-03-16 ENCOUNTER — Ambulatory Visit: Payer: Medicare Other | Admitting: Cardiovascular Disease

## 2023-03-20 ENCOUNTER — Other Ambulatory Visit: Payer: Self-pay | Admitting: Cardiovascular Disease

## 2023-08-09 ENCOUNTER — Encounter: Payer: Self-pay | Admitting: Cardiovascular Disease
